# Patient Record
Sex: Female | Born: 1935 | Race: White | Hispanic: No | Marital: Married | Smoking: Never smoker
Health system: Southern US, Community
[De-identification: ages and names within clinical notes are randomized; demographics above are authoritative.]

## PROBLEM LIST (undated history)

## (undated) DIAGNOSIS — E079 Disorder of thyroid, unspecified: Secondary | ICD-10-CM

## (undated) DIAGNOSIS — M1990 Unspecified osteoarthritis, unspecified site: Secondary | ICD-10-CM

## (undated) DIAGNOSIS — E785 Hyperlipidemia, unspecified: Secondary | ICD-10-CM

## (undated) DIAGNOSIS — N189 Chronic kidney disease, unspecified: Secondary | ICD-10-CM

## (undated) DIAGNOSIS — T8571XA Infection and inflammatory reaction due to peritoneal dialysis catheter, initial encounter: Secondary | ICD-10-CM

## (undated) DIAGNOSIS — E119 Type 2 diabetes mellitus without complications: Secondary | ICD-10-CM

## (undated) DIAGNOSIS — M7989 Other specified soft tissue disorders: Secondary | ICD-10-CM

## (undated) DIAGNOSIS — H269 Unspecified cataract: Secondary | ICD-10-CM

## (undated) DIAGNOSIS — I10 Essential (primary) hypertension: Secondary | ICD-10-CM

## (undated) HISTORY — DX: Unspecified cataract: H26.9

## (undated) HISTORY — DX: Essential (primary) hypertension: I10

## (undated) HISTORY — DX: Disorder of thyroid, unspecified: E07.9

## (undated) HISTORY — DX: Unspecified osteoarthritis, unspecified site: M19.90

## (undated) HISTORY — DX: Chronic kidney disease, unspecified: N18.9

## (undated) HISTORY — PX: STOMACH SURGERY: SHX791

## (undated) HISTORY — DX: Other specified soft tissue disorders: M79.89

## (undated) HISTORY — DX: Infection and inflammatory reaction due to peritoneal dialysis catheter, initial encounter: T85.71XA

## (undated) HISTORY — DX: Type 2 diabetes mellitus without complications: E11.9

## (undated) HISTORY — DX: Hyperlipidemia, unspecified: E78.5

---

## 1999-06-23 ENCOUNTER — Ambulatory Visit (HOSPITAL_COMMUNITY): Admission: RE | Admit: 1999-06-23 | Discharge: 1999-06-23 | Payer: Self-pay | Admitting: Neurosurgery

## 1999-08-04 ENCOUNTER — Ambulatory Visit (HOSPITAL_COMMUNITY): Admission: RE | Admit: 1999-08-04 | Discharge: 1999-08-05 | Payer: Self-pay | Admitting: Neurosurgery

## 2000-01-01 ENCOUNTER — Ambulatory Visit (HOSPITAL_COMMUNITY): Admission: RE | Admit: 2000-01-01 | Discharge: 2000-01-01 | Payer: Self-pay | Admitting: Neurosurgery

## 2007-10-15 ENCOUNTER — Inpatient Hospital Stay (HOSPITAL_COMMUNITY): Admission: AD | Admit: 2007-10-15 | Discharge: 2007-10-19 | Payer: Self-pay | Admitting: *Deleted

## 2007-10-15 ENCOUNTER — Ambulatory Visit: Payer: Self-pay | Admitting: *Deleted

## 2007-10-15 ENCOUNTER — Ambulatory Visit: Payer: Self-pay | Admitting: Internal Medicine

## 2007-10-16 ENCOUNTER — Encounter (INDEPENDENT_AMBULATORY_CARE_PROVIDER_SITE_OTHER): Payer: Self-pay | Admitting: Family Medicine

## 2007-10-16 ENCOUNTER — Encounter (INDEPENDENT_AMBULATORY_CARE_PROVIDER_SITE_OTHER): Payer: Self-pay | Admitting: Interventional Radiology

## 2007-10-19 ENCOUNTER — Encounter: Payer: Self-pay | Admitting: Internal Medicine

## 2009-07-06 IMAGING — CR DG CHEST 2V
2 series · 2 of 2 positions shown · non-contrast
Comparison: 10/15/2007.

01/20/08 – REPORT NOW REFLECTS CORRECT ORDERING PHYSICIAN.
CLINICAL DATA: Short of breath, cough, renal failure

 CHEST - 2 VIEW

[w chest pa]
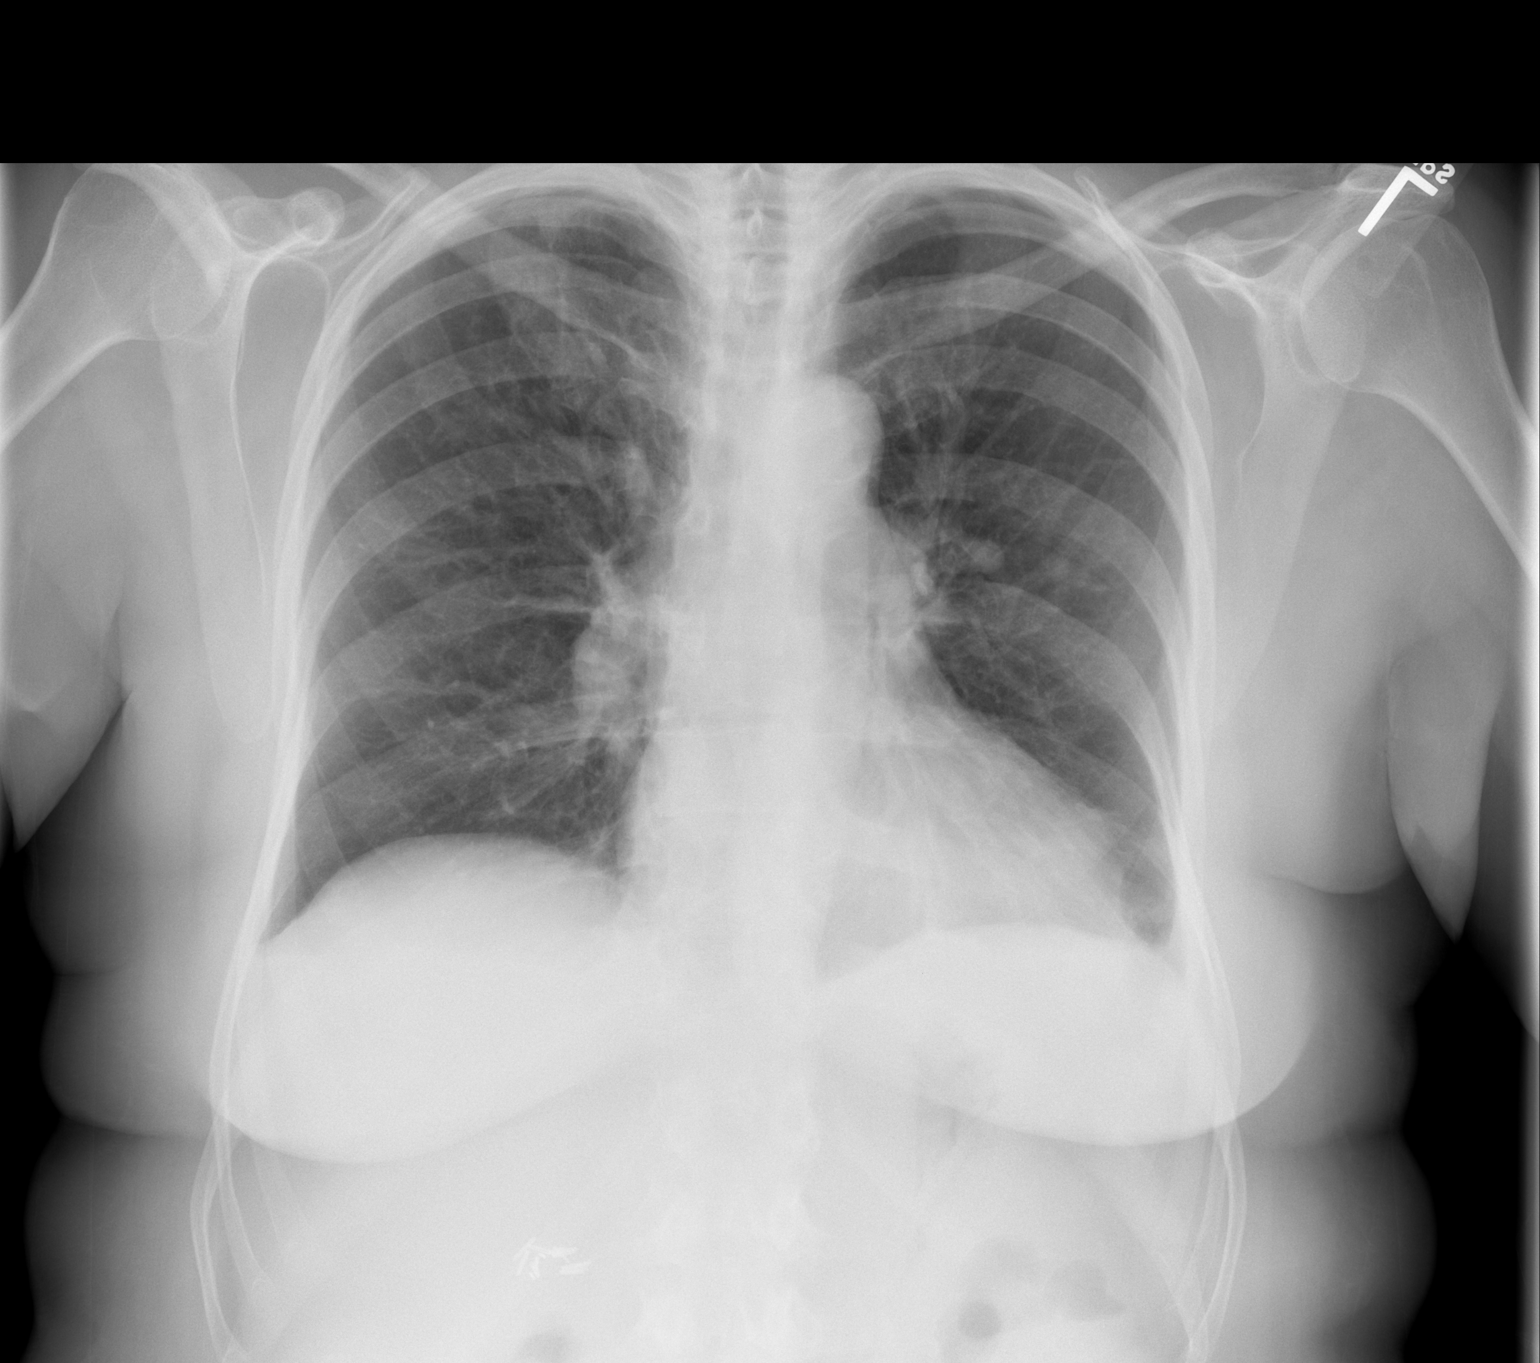

[w chest lat]
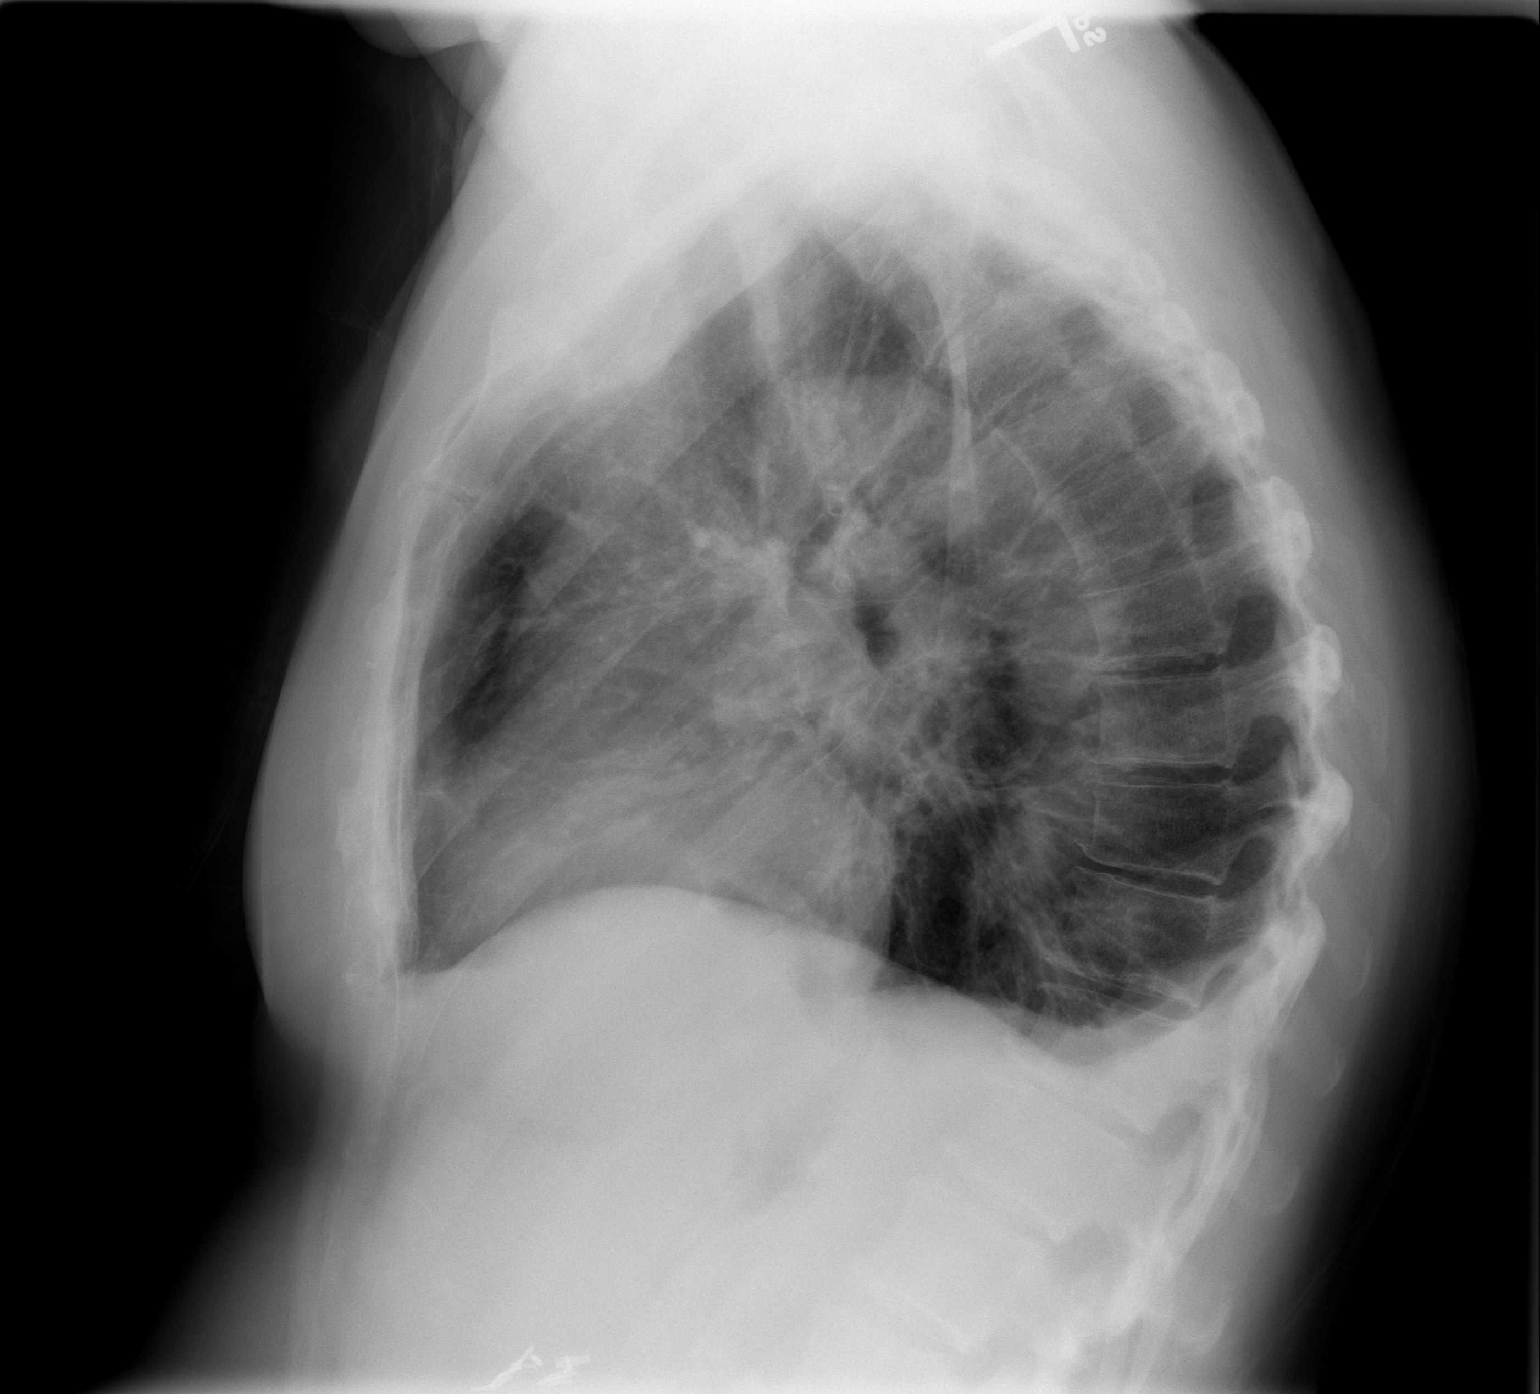

[2 of 2 positions shown; findings below may reference images not displayed]

FINDINGS: There are small pleural effusions present. There is mild
 pulmonary vascular congestion noted. Heart is within upper limits
 of normal. No bony abnormality is seen.
IMPRESSION: Small bilateral pleural effusions and mild pulmonary vascular
 congestion.

## 2010-11-25 NOTE — Discharge Summary (Signed)
Linda Jordan, Linda Jordan NO.:  [id number]   MEDICAL RECORD NO.:  [id number]          PATIENT TYPE:  INP   LOCATION:  6708                         FACILITY:  [facility]   PHYSICIAN:  Lucy Chris, MD     DATE OF BIRTH:  1929-10-02   DATE OF ADMISSION:  10/15/2007  DATE OF DISCHARGE:  10/19/2007                               DISCHARGE SUMMARY   DISCHARGE DIAGNOSES:  1. Nephrotic syndrome.  2. Question of a vascular rash.  3. Neuropathy.  4. Hypertension.  5. History of cholecystitis and cholelithiasis.  6. History of appendicitis.  7. History of tonsillitis.  8. Adenoidectomy.  9. Carpal tunnel release.  10.Degenerative disk disease.  11.Kidney biopsy.  12.Hypoglycemia secondary to steroids.  13.Acute bronchitis.  14.Lower extremity edema.   DISCHARGE MEDICATIONS:  1. Metoprolol XL 50 mg 1 p.o. daily.  2. Synthroid 25 mcg 1 p.o. daily.  3. Prednisone 50 mg 1 tablet p.o. b.i.d.  4. Crestor 5 mg 1 tablet daily.  5. Doxycycline 100 mg 1 tablet twice daily until finished, total      course will be 10 days.  6. Lyrica 75 mg once a day.  7. Alprazolam 0.5 g 1 tablet q.8 h. p.r.n. anxiety.  8. Aspirin 81 mg to be restarted on October 26, 2007.  9. Imodium A-D p.r.n. diarrhea.  10.Furosemide 80 mg 2 pills 3 times a day.   The patient was instructed to stop taking lisinopril, stop taking  previously scheduled dose of Lasix, to stop taking potassium and to stop  taking nabumetone and her dose of Lyrica was decreased.   DISPOSITION:  The patient is to see Dr. Truman Hayward in [place], her primary  care physician in 1-2 weeks.  The patient is to see Dr. Eddie Dibbles at [facility], that office will call the patient to schedule a  followup.  On the day of discharge, the patient had no complaints.  She  was afebrile.  Her blood pressure was 160/68, satting 99% on room air.  Her white blood cell count was elevated to 14.1, so that needs to be  rechecked.  She was given a  course of doxycycline for an upper  respiratory illness.  Her H&H was 11.5 and 33.5, appears to be at the  patient's baseline.  Her creatinine was 2.6, BUN was 65.  This should be  followed up as well at her outpatient visit.  She has a number of labs  pending done in [place] and in our hospital, that will be dictated at a  later point.  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  Her lower extremities, she had marked peripheral edema as well  as a vasculitic-appearing rash.  This is to be followed up on as well as  an outpatient.   On day of discharge, the rash extended from her midshin to ankle, red  and indurated with some nonblanching petechiae, has had a classic  appearance of cellulitic component versus vascular insufficiency  secondary to edema.   PROCEDURE PERFORMED:  1. She had a chest x-ray done on October 16, 2007, which showed small      pleural effusions with mild pulmonary vascular congestions, heart      was within the upper limits normal with no bony abnormalities.      Ultrasound-guided biopsy of her left kidney showed __________ and      was sent to [facility] for further evaluation.  At outside hospital she      had a chest x-ray on October 14, 2007, which showed some interstitial      prominence and no effusions and had a repeat chest x-ray which      showed some left lower lobe atelectasis versus infiltrate beyond      October 14, 2007.  She had a renal ultrasound of the right showed no      hydronephrosis with a mild echogenic cortex, small cyst, measuring      11.5 cm small.  The left kidney had no hydronephrosis, was mildly      echogenic with no focal parenchymal abnormalities measuring 12.2 cm      but with trace ascites.  She had a lower extremity duplex done on      October 13, 2007, which showed no evidence of DVT.  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  She had a V/Q scan done on October 13, 2007,      with low probability of PE.  An echo done in late March 2009 showed      an EF of 60%-65% with left atrial enlargement and mild tricuspid      regurgitation. Peripheral smear done on October 13, 2007, was deemed      to be normal.  UA, this had specific gravity of 1.008, pH of 7 and      was negative for bili, glucose, ketones, had moderate blood, 100 of      protein, 0.2 of urine bili, negative for nitrites and leukocytes.  2. Lipid profile revealed total cholesterol of 227, triglyceride of      131, HDL of 19, and LDL of 182.  3. She had a protein electrophoresis.  Total protein in urine was      noted to be 3272, noted to be high with no alpha-1, no alpha-2, no      beta or gamma noted in the urine.  UPEP at 24 hours noted to be      4200 with 6.34 kappa light chain noted to be high, 2.25 lambda      chains noted to be high.   ADMISSION HISTORY AND PHYSICAL:  Linda Jordan is a 77 year old female who  transferred here from an outside hospital for further evaluation of a  possible nephrotic syndrome.  Briefly, she is a female who developed  acute shortness of breath with increased weakness and feeling fully  fatigue over the past month.  She had been followed by her PCP, Dr. Truman Hayward  and had been given some diuretics for lower extremity edema.  Unfortunately, she has not responded to diuretics initially.  An  echocardiogram was done which showed a normal EF of 60%-65%.  She noted  that on diuretics, her urine output fell and she also developed a rash  in her lower extremities, found to be vasculitic in nature and thus was  admitted to this hospital on October 13, 2007.  At that time, PE was ruled  out and myocardial infarction was ruled out  as well.  EF was noted to be  60%-65%.  Her creatinine worsened over the hospitalization stay.  She  was admitted at the outside hospital with a creatinine of 1.47, but was  transferred with a creatinine of 2.9.  Serum albumin at the time of   admission at an outside hospital was 2.0, calcium of 10.2, negative  cardiac enzymes.  BNP of 1650.  Urinalysis at the outside hospital was  significant for 3+ __________.  She was transferred here for further  workup and better access to resources.  The renal team was  intermittently involved with her hospitalization.   LABORATORY DATA:  Her labs on admission to our institution; CBC, white  blood cell count of 15.9, hemoglobin 12.3, hematocrit 36.3, platelet  count of 222, 86% neutrophils.  PT was noted to be 13.2 and INR of 1.0,  PTT was 38.  Sodium of 136, potassium of 3.1, chloride 95, bicarbonate  of 28, BUN of 45, creatinine of 2.91, glucose of 179.  Her T. bili was  0.3, alk phos was 67, AST was 74, ALT was 52, total protein of 5.3,  albumin was noted to be 1.0, calcium was noted to be 7.8.  Her BMP on  admission here was noted to be 318.  UA on admission here showed  specific gravity of 1.008, pH of 7.0, and only positive for moderate  blood with 7-10 red blood cells per high-power field.  A random urine  creatinine is 47, random urine sodium was 114, random urine protein was  noted to be 90.  Lipid profile, total cholesterol of 227, triglyceride  of 131, HDL of 19, LDL of 182, TSH was 1.233, PTH was 190.5 with calcium  on that test was noted to be 7.7.  Vitamin D level was noted to be 36.   HOSPITAL COURSE:  1. Nephrotic syndrome.  Ms. Divita is a 77 year old female who      developed weakness leading to lower extremity edema that was not      responsive to diuretic as well as a vasculitic-appearing rash on      her lower extremities.  She was initially admitted at outside      hospital where she was found to have large amounts of protein in      her urine and the preliminary diagnosis of nephrotic syndrome was      given.  Given that she was acutely short of breath and has      peripheral edema, the notion of heart failure was raised, however,      while her BNP was elevated,  her EF was noted to be 60%-65%.  PE was      also considered given her acute shortness of breath.  However,      given the duration, it was low on the differential and she had      negative duplex ultrasounds as well as negative V/Q scan.  Her D-      dimer was elevated at the outside hospital, however.  The renal      team was consulted and they felt that the rash as well as syndrome      may be an autoimmune disease versus nephrotic syndrome and it was      decided that given the appearance of her rash that she would      benefit from a renal biopsy.  A number of labs were drawn to      further evaluate for  this patient's cause of proteinuria; some of      these labs were done at the outside hospital and some were done      here.  Also, noted that at the time of discharge, she still had a      cardiolipin, __________ and sed rate noted at the outside hospital      was noted to be 75.  Antiglomerular basement membrane antibody was      noted to be less than 3.0 which was __________.  Her complement      level was noted to be 153 __________ protein electrophoresis showed      total protein, albumin  __________ alpha-1 globulin was noted to be      0.6 which was mildly elevated, alpha-2 globulin was 1.0 __________      gamma globulin 0.4 which was mildly decreased.  There was no      temperature spike observed.  IgG serum was noted to be 368 which      was noted to low; IgA serum was 338, normal limit; IgM 166, normal      limit with an apparent IFE screen was appeared to be in an apparent      immunofixation pattern done at the outside hospital.  Her C-ANCA      was noted to be less than 1-20 which was considered a negative      titer.  Perinuclear ANCA (PA-ANCA) was noted to be less than 1-20,      also noted to be a negative titer.  Atypical ANCA was also noted to      be less than 1-20 which was noted to be a negative titer.  C-      reactive protein was noted to be 475.0 which is  markedly elevated      in outside hospital.  Complement CH50 was noted to be 48, within      normal limits.  Urine protein electrophoresis done on October 14, 2007, showed a total protein of 26.5, markedly elevated, showed a      urine albumin of 42.8, alpha-1 globulin of 18.8%, alpha-2 globulin      of 93%, beta globulin of 12.8%, and gamma globulin of 6.3%; no M      spike noted.  This was repeated at the outside hospital which then      showed an albumin of 48%, alpha-1 globulin 74%, alpha-2 globulin      60.5%, beta globulin 10.8% and gamma globulin was 6.6%.  Of note,      during this hospitalization, her parathyroid hormone was noted to      be elevated at 190.5.  However, her calcium was within normal      limits,  even when corrected with a low albumin, her TSH was      normal.  Protein electrophoresis for Bence Jones proteins were done      here at this hospital and noted 3272 protein; however, there was no      alpha-2, alpha-1, beta, or gamma noted.  Light chains were noted to      be elevated, kappa light chain was 6.34, which was markedly      elevated, lambda chain was 2.25, which was mildly elevated.  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  So, at the time of discharge her creatinine was 2.65, so remained      relatively stable while the idea of an ACE inhibitor was      entertained given that her serum creatinine was acutely changing,      it was decided to hold off on that, and also given her age, it is      probably too late to prevent any further damage to glomerular      apparatus of the kidney.  __________ follow with renal of her      scheduled appointment.  2. Upper respiratory infection.  The patient had a elevated white      count, while this may have been done due to steroids which was      started secondary to the questionable nephrotic syndrome.   She was      noted to have some questionable infiltrate on x-rays as well as      cough, so it was decided to treat her empirically.  She was      initially started on Avelox; however, she remained afebrile.  Her      white count resolved.  So, she was changed to doxycycline for a      course of 10 days.  3. Diarrhea.  The patient developed diarrhea while in the hospital.      C. diff toxin was done 3 times, all 3 times were noted to be      negative.  She was given Imodium for control of the diarrhea, was      likely secondary to steroid use; however, her electrolytes remained      within normal limits and she was instructed to use Imodium A-D      should she need to __________.  4. Steroid use.  She was started on steroid with a questionable      nephrotic syndrome given that this is likely autoimmune with      possibility to respond or infectious in nature that it would likely      respond to steroid treatment.  However, she developed hypoglycemia      on the steroids and insulin treatment was discussed with the      patient and she was told to follow up with her PCP for this and she      did not feel at this time that it was indicated.  5. Hyperlipidemia.  She was given Crestor 5 mg 1 tablet daily.  This      was decreased from her initial dose given mildly elevated LFTs.      This is to be followed as an outpatient and she is going to be      restarted on her Crestor on normal dose pending the normalization      of her LFT.  On the day of discharge, her blood cultures were      negative for anaerobic and aerobic bacteria.  Vitamin D level was      noted to be 36.   LABORATORY DATA:  Sodium 138, potassium 4.1, chloride 103, bicarbonate  24, BUN 65, creatinine 2.65, glucose of 288, calcium of 7.6.  CBC on the  day of discharge white blood cell count 14.1, hemoglobin 11.5,  hematocrit 33.1, and platelet count 251.      Acquanetta Chain, D.O.  Electronically Signed       Lucy Chris, MD  Electronically Signed    ELG/MEDQ  D:  10/22/2007  T:  10/23/2007  Job:  [id number]   cc:   Cher Nakai

## 2010-11-25 NOTE — H&P (Signed)
[place]  Patient:    Linda Jordan                        MRN: [id number] Adm. Date:  [id number] Attending:  Newman Pies D                         History and Physical  CHIEF COMPLAINT:  Left left pain.  HISTORY OF PRESENT ILLNESS:  The patient is a 77 year old white female who was n her usual state of good health until approximately March 23, 1999.  It was n that date that she stepped into a hole and jarred her hip and back.  She had pain and was seen by her primary doctor in [place] and was treated with medications. Hip x-rays were obtained and were "okay."  She continued to have pain and was referred to Dr. Lara Mulch who evaluated her and recommended physical therapy.  The physical therapy made her worse.  She was sent for lumbar MRI, and sent for my consultation.  The patient has pain primarily in the left side of her low back which radiates nto her left hip, down the lateral aspect of her left leg to her knee, and sometimes she has some pain which radiates down to her foot in approximately the S1 distribution.  She has had no pain on the right.  Discomfort is worse with prolonged standing and seems to be worse at night.  It wakes her up at night. he has had chronic low back pain which does not seem to have been exacerbated by the fall.  She has a questionable neuropathy which was diagnosed by a neurologist in [place] approximately 10 years ago secondary to numbness in her feet.  PAST MEDICAL HISTORY:  Positive for hypertension, questionable neuropathy, remote history of cholecystitis/cholelithiasis, appendicitis, tonsillitis.  PRESENT MEDICATIONS: 1. Premarin 0.625 mg p.o. q.d. 2. Atenolol with hydrochlorothiazide 50/25 p.o. q.d. 3. Prinivil 20 mg p.o. q.d. 4. Potassium chloride 3 mEq t.i.d. 5. Naprosyn 500 mg p.o. q.d. 6. Hydrocodone p.r.n.  ALLERGIES:  No known drug allergies.  PAST SURGICAL HISTORY:   Partial tonsillectomy, dilatation and curettage, carpal  tunnel release, tonsillectomy, adenoidectomy, cholecystectomy, appendectomy.  FAMILY MEDICAL HISTORY:  The patients mother died at age 59 secondary to heart problems.  The patients father died at age 51 secondary to heart disease and "abdominal process."  The patient could not be more specific.  SOCIAL HISTORY:  The patient is married.  She has one daughter.  She lives in [place].  She retired from an office job.  She denies tobacco, ethanol, and drug  use.  REVIEW OF SYSTEMS:  Negative except as above.  PHYSICAL EXAMINATION:  GENERAL:  A pleasant, well-nourished, well-developed, 76 year old white female n no apparent distress.  VITAL SIGNS:  Height 5 feet 7 inches, weight 176 pounds.  HEENT:  Normocephalic, atraumatic.  Pupils are equal, round and reactive to light. Extraocular muscles intact. Sclerae white.  Conjunctivae pink.  Oropharynx benign. Uvula midline.  NECK:  Supple.  There are no masses, meningismus, deformity, tracheal deviation, jugular venous distension, carotid bruits.  She has a normal cervical range of motion.  Spurlings test is negative.  Lhermittes sign was not present.  Thorax s symmetric.  LUNGS:  Clear to auscultation.  HEART:  Regular rate and rhythm.  ABDOMEN:  Soft, nontender with no guarding or rebound.  Bowel sounds are present.  BACK: No  tenderness to palpation, no deformities. Straight leg raise testing is  equivocal on the left, negative on the right. Faberes test negative bilaterally.  NEUROLOGIC:  The patient is alert and oriented x 3.  Cranial nerves II-XII grossly intact bilaterally.  Vision and hearing are grossly normal bilaterally.  Motor strength is 5/5 bilateral deltoid, biceps, triceps, hand grip, wrist extensor, interosseus psoas, quadriceps, gastrocnemius, extensor hallucis longus. Cerebellar exam intact to rapid alternating movements of the upper  extremities bilaterally. Deep tendon reflexes are 2/4 in bilateral biceps, triceps, brachial radialis, quadriceps, and gastrocnemius.  There is bilateral flexor plantar reflexes.  No  ankle clonus. Sensory exam is grossly normal to light touch and pinprick sensation of all tested dermatomes bilaterally.  DIAGNOSTIC STUDIES:  The patient had AP and lateral lumbar x-ray performed at [facility] May 07, 1999, which is essentially unremarkable  except for degenerative disk disease at L5-S1.  The patient had a lumbar MRI performed at [facility] May 07, 1999, which showed normal lumbar lordosis.  She has a mild degenerative disease at L4-5 with mild desiccation of the disk and mild central bulging.  The same is seen at L5-S1.  On the axial images, L2-3 is normal.  L3-4 has mild to multifactorial spinal stenosis.  L4-5 has central bulging of the disk.  L5-S1 has degenerative  disease, no neural compression.  I subsequently performed a lumbar CT myelogram when she failed to improve with medical management at [facility] on June 23, 1999.  She had an extradural defect at L4-5 on the right and a small extradural defect at L3-4 on the left.  On the CT scan at L3-4, she had a herniated nucleus pulposus at L3-4 on he left, but foraminal and extraspinal does appear to compress the thecal sac and somewhat compress the left L3 nerve root. L4-5 has similar findings on the right. L5-S1 has degenerative disease with vacuum disk phenomenon but no neural compression.  ASSESSMENT/PLAN: 1. L3-4 degenerative disk disease. 2. Herniated nucleus pulposus. 3. Lumbago. 4. Lumbar radiculopathy.  I have discussed the surgery with the patient and reviewed her MRI and CT myelogram with her, pointing out the abnormalities.  The best I can tell, she is symptomatic for disk herniation of L3-4 on the left, causing left 3 or 4  radiculopathy. The patient does have a disk herniation and spinal stenosis at L4-5, but it does not bother her nearly as much as the left.  I discussed the various treatment options  with her including doing nothing, continued medical management, steroid injections, and surgery.  I have described the procedure of left L3-4 laminotomy, foraminotomy, and extraforaminal, i.e., far lateral diskectomy.  I showed her surgical models and discussed the risks of surgery.  The patient has weighed the risks, benefits, and alternatives to surgery and would like to proceed with the operation on August 04, 1999.  History of hypertension and possible neuropathy noted. DD:  08/04/99 TD:  08/04/99 Job: [id number]

## 2010-11-25 NOTE — Op Note (Signed)
[place]. [facility]  Patient:    Linda Jordan                        MRN: [id number] Proc. Date: 08/04/99 Adm. Date:  [id number] Disc. Date: [id number] Attending:  Newman Pies D                           Operative Report  BRIEF HISTORY:  Patient is a 77 year old white female who has suffered from back and left leg pain for many months.  She has failed medical management and was worked as an outpatient with a lumbar MRI as well as a lumbar myelo-CT.  Her symptoms were consistent with a lumbar radiculopathy.  She had a herniated disc at L3-4 on the left.  She therefore weighed the risks, benefits and alternatives to surgery and decided to proceed with a microdiskectomy.  PREOPERATIVE DIAGNOSES:  Far-lateral herniated nucleus pulposus, L3-4 on the left; left L3 radiculopathy.  POSTOPERATIVE DIAGNOSES:  Far-lateral herniated nucleus pulposus, L3-4 on the left; left L3 radiculopathy.  PROCEDURE:  Left far-lateral microdiskectomy using microdissection.  SURGEON:  Ophelia Charter, M.D.  ASSISTANT:  Madelon Lips. Quentin Cornwall, M.D.  ANESTHESIA:  General endotracheal.  ESTIMATED BLOOD LOSS:  100 cc.  SPECIMENS:  None.  DRAINS:  None.  COMPLICATIONS:  None.  DESCRIPTION OF PROCEDURE:  Patient was brought to the operating room by the anesthesia team.  General endotracheal anesthesia was induced.  Patient was then turned to the prone position on the Wilson frame.  Her lumbosacral region was then prepared with Betadine scrub and Betadine solution.  Sterile drapes were applied and I then injected the area to be incised with Marcaine with epinephrine solution and I used the scalpel to make a vertical incision in the midline over the L3-4  interspace.  I used electrocautery to dissect down to the thoracolumbar fascia nd I divided just to the left of midline and performed a left-sided subperiosteal dissection, stripping the paraspinal musculature  from the spinous process and laminae of L3 and L4.  I inserted the McCullough retractor and obtained intraoperative radiograph.  I then brought the operative microscope into the field and under its magnification and illumination, I completed the decompression/microdissection.  I used the Midas Rex high-speed drill to perform a left L3 laminotomy.  I drilled off the caudal edge of the left L3 lamina until I encountered the insertion of he ligamentum flavum.  I then widened the laminotomy and removed the left L3-4 ligamentum flavum.  I decompressed the thecal sac and identified the left L4 nerve root and then performed an intraspinal decompression.  I used the angled curette to remove some of the excess ligamentum flavum from the lateral recess.  I got a good decompression of her thecal sac and the left L4 nerve root.  I then turned my attention to the extra-foraminal, i.e., far-lateral diskectomy.  I used electrocautery to detach the fascia from the lateral edge of the left L3-4 facet. I then used a Midas Rex high-speed drill to drill off the lateral aspect of the  left L3-4 facet.  I then divided the intertransverse ligament with the 15 blade  scalpel and removed the ligament with the Kerrison punch and then removed more f the lateral aspect of the left L3-4 facet.  I then used microdissection to dissect through the soft tissue and identify the left extra-foraminal L3 nerve root.  I  then carefully freed it up from the epidural tissue and retracted it gently laterally and beneath it was a moderate-size subligamentous disc rupture which as compressing the ventral aspect of the L3 nerve root.  I then incised into this subligamentous disc herniation and performed a partial diskectomy using the pituitary forceps and the Epstein curettes.  I then palpated about the ventral surface of the left L3 nerve root and it was well-decompressed.  I then palpated once again about the anterior  aspect of the thecal sac, i.e., via the L3 laminotomy, and traced the L4 nerve root throughout the neural foramen and it was well-decompressed.  I then copiously irrigated the wound with Bacitracin solution, removed the solution and achieved stringent hemostasis with bipolar electrocautery. I then removed the McCullough retractor and reapproximated the patients thoracolumbar fascia with interrupted #1 Vicryl, subcutaneous tissue with interrupted 2-0 Vicryl and skin with Steri-Strips and Benzoin and the wound was  then coated with Bacitracin ointment.  A sterile dressing was applied, the drapes were removed and patient was subsequently returned to a supine position where she was extubated by the anesthesia team and transported to the postanesthesia care  unit in stable condition.  All sponge, instrument and needle counts were correct at the end of this case.DD:  08/04/99 TD:  08/06/99 Job: [id number]

## 2011-02-16 DIAGNOSIS — M1990 Unspecified osteoarthritis, unspecified site: Secondary | ICD-10-CM

## 2011-02-16 DIAGNOSIS — M7989 Other specified soft tissue disorders: Secondary | ICD-10-CM

## 2011-02-16 DIAGNOSIS — B351 Tinea unguium: Secondary | ICD-10-CM | POA: Insufficient documentation

## 2011-04-04 LAB — PROTEIN ELECTROPHORESIS, SERUM
Beta 2: 11.8 — ABNORMAL HIGH
Beta Globulin: 7.8 — ABNORMAL HIGH
Gamma Globulin: 9.2 — ABNORMAL LOW

## 2011-04-04 LAB — RENAL FUNCTION PANEL
Albumin: 1.1 — ABNORMAL LOW
Albumin: 1.1 — ABNORMAL LOW
CO2: 30
Calcium: 8.2 — ABNORMAL LOW
Chloride: 98
Chloride: 99
GFR calc Af Amer: 19 — ABNORMAL LOW
GFR calc Af Amer: 19 — ABNORMAL LOW
GFR calc Af Amer: 19 — ABNORMAL LOW
GFR calc non Af Amer: 15 — ABNORMAL LOW
GFR calc non Af Amer: 16 — ABNORMAL LOW
Glucose, Bld: 123 — ABNORMAL HIGH
Phosphorus: 3.9
Potassium: 4.2
Sodium: 136
Sodium: 137
Sodium: 140

## 2011-04-04 LAB — COMPREHENSIVE METABOLIC PANEL
ALT: 32
AST: 34
Albumin: 1 — ABNORMAL LOW
CO2: 28
Calcium: 7.8 — ABNORMAL LOW
GFR calc Af Amer: 19 — ABNORMAL LOW
GFR calc non Af Amer: 16 — ABNORMAL LOW
Sodium: 136
Total Protein: 5.3 — ABNORMAL LOW

## 2011-04-04 LAB — CBC
Hemoglobin: 11.1 — ABNORMAL LOW
Hemoglobin: 11.5 — ABNORMAL LOW
Hemoglobin: 11.9 — ABNORMAL LOW
Hemoglobin: 12.8
MCHC: 33.9
MCHC: 35.1
Platelets: 251
RBC: 3.52 — ABNORMAL LOW
RBC: 3.95
RBC: 3.98
RBC: 4.14
RDW: 13
RDW: 13.1
RDW: 13.3
RDW: 13.6
WBC: 12.1 — ABNORMAL HIGH
WBC: 13.1 — ABNORMAL HIGH
WBC: 15.9 — ABNORMAL HIGH

## 2011-04-04 LAB — UIFE/LIGHT CHAINS/TP QN, 24-HR UR
Albumin, U: DETECTED
Alpha 1, Urine: DETECTED — AB
Beta, Urine: DETECTED — AB
Free Lambda Excretion/Day: 94.5
Gamma Globulin, Urine: DETECTED — AB
Total Protein, Urine: 77.9

## 2011-04-04 LAB — URINALYSIS, ROUTINE W REFLEX MICROSCOPIC
Bilirubin Urine: NEGATIVE
Ketones, ur: NEGATIVE
Nitrite: NEGATIVE
Protein, ur: 100 — AB
Specific Gravity, Urine: 1.008
Urobilinogen, UA: 0.2

## 2011-04-04 LAB — BASIC METABOLIC PANEL
BUN: 65 — ABNORMAL HIGH
CO2: 27
Calcium: 7.6 — ABNORMAL LOW
Calcium: 8 — ABNORMAL LOW
Creatinine, Ser: 2.65 — ABNORMAL HIGH
Creatinine, Ser: 2.91 — ABNORMAL HIGH
GFR calc Af Amer: 19 — ABNORMAL LOW
GFR calc non Af Amer: 16 — ABNORMAL LOW
GFR calc non Af Amer: 18 — ABNORMAL LOW
Glucose, Bld: 288 — ABNORMAL HIGH
Sodium: 138

## 2011-04-04 LAB — VITAMIN D 1,25 DIHYDROXY: Vit D, 1,25-Dihydroxy: 36 pg/mL (ref 15–75)

## 2011-04-04 LAB — CULTURE, BLOOD (ROUTINE X 2): Culture: NO GROWTH

## 2011-04-04 LAB — CREATININE CLEARANCE, URINE, 24 HOUR
Collection Interval-CRCL: 24
Creatinine Clearance: 27 — ABNORMAL LOW
Creatinine, 24H Ur: 1126
Urine Total Volume-CRCL: 4200

## 2011-04-04 LAB — APTT: aPTT: 38 — ABNORMAL HIGH

## 2011-04-04 LAB — URINE CULTURE
Colony Count: NO GROWTH
Culture: NO GROWTH

## 2011-04-04 LAB — DIFFERENTIAL
Basophils Absolute: 0
Eosinophils Absolute: 0.2
Eosinophils Relative: 1
Lymphocytes Relative: 10 — ABNORMAL LOW
Lymphs Abs: 1.4
Monocytes Absolute: 0.6
Monocytes Absolute: 0.7
Monocytes Relative: 4
Monocytes Relative: 4
Neutro Abs: 11.2 — ABNORMAL HIGH

## 2011-04-04 LAB — PROTIME-INR: Prothrombin Time: 13.1

## 2011-04-04 LAB — LIPID PANEL
Cholesterol: 227 — ABNORMAL HIGH
HDL: 19 — ABNORMAL LOW
Total CHOL/HDL Ratio: 11.9
VLDL: 26

## 2011-04-04 LAB — CLOSTRIDIUM DIFFICILE EIA
C difficile Toxins A+B, EIA: NEGATIVE
C difficile Toxins A+B, EIA: NEGATIVE
C difficile Toxins A+B, EIA: NEGATIVE

## 2011-04-04 LAB — URINE MICROSCOPIC-ADD ON

## 2011-04-04 LAB — B-NATRIURETIC PEPTIDE (CONVERTED LAB): Pro B Natriuretic peptide (BNP): 318 — ABNORMAL HIGH

## 2011-04-04 LAB — BLEEDING TIME: Bleeding Time: 5.5

## 2011-04-04 LAB — PROTEIN, URINE, RANDOM: Total Protein, Urine: 90

## 2011-04-04 LAB — TSH: TSH: 1.233

## 2011-07-25 DIAGNOSIS — Z7901 Long term (current) use of anticoagulants: Secondary | ICD-10-CM | POA: Diagnosis not present

## 2011-07-25 DIAGNOSIS — K219 Gastro-esophageal reflux disease without esophagitis: Secondary | ICD-10-CM | POA: Diagnosis not present

## 2011-07-25 DIAGNOSIS — E119 Type 2 diabetes mellitus without complications: Secondary | ICD-10-CM | POA: Diagnosis not present

## 2011-07-25 DIAGNOSIS — E039 Hypothyroidism, unspecified: Secondary | ICD-10-CM | POA: Diagnosis not present

## 2011-07-25 DIAGNOSIS — Z6829 Body mass index (BMI) 29.0-29.9, adult: Secondary | ICD-10-CM | POA: Diagnosis not present

## 2011-07-25 DIAGNOSIS — E785 Hyperlipidemia, unspecified: Secondary | ICD-10-CM | POA: Diagnosis not present

## 2011-08-09 DIAGNOSIS — B351 Tinea unguium: Secondary | ICD-10-CM | POA: Diagnosis not present

## 2011-08-09 DIAGNOSIS — M79609 Pain in unspecified limb: Secondary | ICD-10-CM | POA: Diagnosis not present

## 2011-08-15 DIAGNOSIS — K219 Gastro-esophageal reflux disease without esophagitis: Secondary | ICD-10-CM | POA: Diagnosis not present

## 2011-08-15 DIAGNOSIS — I10 Essential (primary) hypertension: Secondary | ICD-10-CM | POA: Diagnosis not present

## 2011-08-15 DIAGNOSIS — E119 Type 2 diabetes mellitus without complications: Secondary | ICD-10-CM | POA: Diagnosis not present

## 2011-08-15 DIAGNOSIS — E039 Hypothyroidism, unspecified: Secondary | ICD-10-CM | POA: Diagnosis not present

## 2011-08-22 DIAGNOSIS — E039 Hypothyroidism, unspecified: Secondary | ICD-10-CM | POA: Diagnosis not present

## 2011-08-22 DIAGNOSIS — K219 Gastro-esophageal reflux disease without esophagitis: Secondary | ICD-10-CM | POA: Diagnosis not present

## 2011-08-22 DIAGNOSIS — E119 Type 2 diabetes mellitus without complications: Secondary | ICD-10-CM | POA: Diagnosis not present

## 2011-08-22 DIAGNOSIS — I10 Essential (primary) hypertension: Secondary | ICD-10-CM | POA: Diagnosis not present

## 2011-08-28 DIAGNOSIS — Z7901 Long term (current) use of anticoagulants: Secondary | ICD-10-CM | POA: Diagnosis not present

## 2011-09-25 DIAGNOSIS — K219 Gastro-esophageal reflux disease without esophagitis: Secondary | ICD-10-CM | POA: Diagnosis not present

## 2011-09-25 DIAGNOSIS — Z6828 Body mass index (BMI) 28.0-28.9, adult: Secondary | ICD-10-CM | POA: Diagnosis not present

## 2011-09-25 DIAGNOSIS — Z7901 Long term (current) use of anticoagulants: Secondary | ICD-10-CM | POA: Diagnosis not present

## 2011-09-25 DIAGNOSIS — E119 Type 2 diabetes mellitus without complications: Secondary | ICD-10-CM | POA: Diagnosis not present

## 2011-09-25 DIAGNOSIS — E785 Hyperlipidemia, unspecified: Secondary | ICD-10-CM | POA: Diagnosis not present

## 2011-09-25 DIAGNOSIS — I10 Essential (primary) hypertension: Secondary | ICD-10-CM | POA: Diagnosis not present

## 2011-09-25 DIAGNOSIS — E039 Hypothyroidism, unspecified: Secondary | ICD-10-CM | POA: Diagnosis not present

## 2011-10-10 DIAGNOSIS — K219 Gastro-esophageal reflux disease without esophagitis: Secondary | ICD-10-CM | POA: Diagnosis not present

## 2011-10-10 DIAGNOSIS — E119 Type 2 diabetes mellitus without complications: Secondary | ICD-10-CM | POA: Diagnosis not present

## 2011-10-10 DIAGNOSIS — I10 Essential (primary) hypertension: Secondary | ICD-10-CM | POA: Diagnosis not present

## 2011-10-10 DIAGNOSIS — E039 Hypothyroidism, unspecified: Secondary | ICD-10-CM | POA: Diagnosis not present

## 2011-10-17 DIAGNOSIS — I10 Essential (primary) hypertension: Secondary | ICD-10-CM | POA: Diagnosis not present

## 2011-10-17 DIAGNOSIS — E785 Hyperlipidemia, unspecified: Secondary | ICD-10-CM | POA: Diagnosis not present

## 2011-10-17 DIAGNOSIS — K219 Gastro-esophageal reflux disease without esophagitis: Secondary | ICD-10-CM | POA: Diagnosis not present

## 2011-10-17 DIAGNOSIS — E039 Hypothyroidism, unspecified: Secondary | ICD-10-CM | POA: Diagnosis not present

## 2011-10-26 DIAGNOSIS — E039 Hypothyroidism, unspecified: Secondary | ICD-10-CM | POA: Diagnosis not present

## 2011-10-26 DIAGNOSIS — Z6828 Body mass index (BMI) 28.0-28.9, adult: Secondary | ICD-10-CM | POA: Diagnosis not present

## 2011-10-26 DIAGNOSIS — B0229 Other postherpetic nervous system involvement: Secondary | ICD-10-CM | POA: Diagnosis not present

## 2011-10-26 DIAGNOSIS — Z7901 Long term (current) use of anticoagulants: Secondary | ICD-10-CM | POA: Diagnosis not present

## 2011-10-26 DIAGNOSIS — E119 Type 2 diabetes mellitus without complications: Secondary | ICD-10-CM | POA: Diagnosis not present

## 2011-10-26 DIAGNOSIS — K219 Gastro-esophageal reflux disease without esophagitis: Secondary | ICD-10-CM | POA: Diagnosis not present

## 2011-11-06 DIAGNOSIS — L0201 Cutaneous abscess of face: Secondary | ICD-10-CM | POA: Diagnosis not present

## 2011-11-06 DIAGNOSIS — L03211 Cellulitis of face: Secondary | ICD-10-CM | POA: Diagnosis not present

## 2011-11-06 DIAGNOSIS — K219 Gastro-esophageal reflux disease without esophagitis: Secondary | ICD-10-CM | POA: Diagnosis not present

## 2011-11-06 DIAGNOSIS — Z6828 Body mass index (BMI) 28.0-28.9, adult: Secondary | ICD-10-CM | POA: Diagnosis not present

## 2011-11-06 DIAGNOSIS — F09 Unspecified mental disorder due to known physiological condition: Secondary | ICD-10-CM | POA: Diagnosis not present

## 2011-11-08 DIAGNOSIS — L03211 Cellulitis of face: Secondary | ICD-10-CM | POA: Diagnosis not present

## 2011-11-08 DIAGNOSIS — L0201 Cutaneous abscess of face: Secondary | ICD-10-CM | POA: Diagnosis not present

## 2011-11-08 DIAGNOSIS — B029 Zoster without complications: Secondary | ICD-10-CM | POA: Diagnosis not present

## 2011-11-08 DIAGNOSIS — K219 Gastro-esophageal reflux disease without esophagitis: Secondary | ICD-10-CM | POA: Diagnosis not present

## 2011-11-08 DIAGNOSIS — E039 Hypothyroidism, unspecified: Secondary | ICD-10-CM | POA: Diagnosis not present

## 2011-11-09 DIAGNOSIS — N042 Nephrotic syndrome with diffuse membranous glomerulonephritis: Secondary | ICD-10-CM | POA: Diagnosis not present

## 2011-11-09 DIAGNOSIS — I129 Hypertensive chronic kidney disease with stage 1 through stage 4 chronic kidney disease, or unspecified chronic kidney disease: Secondary | ICD-10-CM | POA: Diagnosis not present

## 2011-11-09 DIAGNOSIS — N189 Chronic kidney disease, unspecified: Secondary | ICD-10-CM | POA: Diagnosis not present

## 2011-11-09 DIAGNOSIS — B029 Zoster without complications: Secondary | ICD-10-CM | POA: Diagnosis not present

## 2011-11-15 DIAGNOSIS — J309 Allergic rhinitis, unspecified: Secondary | ICD-10-CM | POA: Diagnosis not present

## 2011-11-15 DIAGNOSIS — L0201 Cutaneous abscess of face: Secondary | ICD-10-CM | POA: Diagnosis not present

## 2011-11-15 DIAGNOSIS — I10 Essential (primary) hypertension: Secondary | ICD-10-CM | POA: Diagnosis not present

## 2011-11-15 DIAGNOSIS — L03211 Cellulitis of face: Secondary | ICD-10-CM | POA: Diagnosis not present

## 2011-11-15 DIAGNOSIS — B029 Zoster without complications: Secondary | ICD-10-CM | POA: Diagnosis not present

## 2011-11-15 DIAGNOSIS — M79609 Pain in unspecified limb: Secondary | ICD-10-CM | POA: Diagnosis not present

## 2011-11-15 DIAGNOSIS — B351 Tinea unguium: Secondary | ICD-10-CM | POA: Diagnosis not present

## 2011-11-27 DIAGNOSIS — Z7901 Long term (current) use of anticoagulants: Secondary | ICD-10-CM | POA: Diagnosis not present

## 2011-12-27 DIAGNOSIS — K219 Gastro-esophageal reflux disease without esophagitis: Secondary | ICD-10-CM | POA: Diagnosis not present

## 2011-12-27 DIAGNOSIS — E039 Hypothyroidism, unspecified: Secondary | ICD-10-CM | POA: Diagnosis not present

## 2011-12-27 DIAGNOSIS — E119 Type 2 diabetes mellitus without complications: Secondary | ICD-10-CM | POA: Diagnosis not present

## 2011-12-27 DIAGNOSIS — Z7901 Long term (current) use of anticoagulants: Secondary | ICD-10-CM | POA: Diagnosis not present

## 2011-12-27 DIAGNOSIS — I10 Essential (primary) hypertension: Secondary | ICD-10-CM | POA: Diagnosis not present

## 2012-01-26 DIAGNOSIS — E119 Type 2 diabetes mellitus without complications: Secondary | ICD-10-CM | POA: Diagnosis not present

## 2012-01-26 DIAGNOSIS — I10 Essential (primary) hypertension: Secondary | ICD-10-CM | POA: Diagnosis not present

## 2012-01-26 DIAGNOSIS — I803 Phlebitis and thrombophlebitis of lower extremities, unspecified: Secondary | ICD-10-CM | POA: Diagnosis not present

## 2012-01-26 DIAGNOSIS — E039 Hypothyroidism, unspecified: Secondary | ICD-10-CM | POA: Diagnosis not present

## 2012-01-26 DIAGNOSIS — Z7901 Long term (current) use of anticoagulants: Secondary | ICD-10-CM | POA: Diagnosis not present

## 2012-02-21 DIAGNOSIS — M79609 Pain in unspecified limb: Secondary | ICD-10-CM | POA: Diagnosis not present

## 2012-02-21 DIAGNOSIS — B351 Tinea unguium: Secondary | ICD-10-CM | POA: Diagnosis not present

## 2012-02-27 DIAGNOSIS — E119 Type 2 diabetes mellitus without complications: Secondary | ICD-10-CM | POA: Diagnosis not present

## 2012-02-27 DIAGNOSIS — Z7901 Long term (current) use of anticoagulants: Secondary | ICD-10-CM | POA: Diagnosis not present

## 2012-02-27 DIAGNOSIS — I803 Phlebitis and thrombophlebitis of lower extremities, unspecified: Secondary | ICD-10-CM | POA: Diagnosis not present

## 2012-02-27 DIAGNOSIS — Z6827 Body mass index (BMI) 27.0-27.9, adult: Secondary | ICD-10-CM | POA: Diagnosis not present

## 2012-02-27 DIAGNOSIS — I10 Essential (primary) hypertension: Secondary | ICD-10-CM | POA: Diagnosis not present

## 2012-03-18 DIAGNOSIS — Z86010 Personal history of colonic polyps: Secondary | ICD-10-CM | POA: Diagnosis not present

## 2012-03-18 DIAGNOSIS — Z79899 Other long term (current) drug therapy: Secondary | ICD-10-CM | POA: Diagnosis not present

## 2012-03-28 DIAGNOSIS — Z7901 Long term (current) use of anticoagulants: Secondary | ICD-10-CM | POA: Diagnosis not present

## 2012-03-28 DIAGNOSIS — E785 Hyperlipidemia, unspecified: Secondary | ICD-10-CM | POA: Diagnosis not present

## 2012-03-28 DIAGNOSIS — I803 Phlebitis and thrombophlebitis of lower extremities, unspecified: Secondary | ICD-10-CM | POA: Diagnosis not present

## 2012-03-28 DIAGNOSIS — Z23 Encounter for immunization: Secondary | ICD-10-CM | POA: Diagnosis not present

## 2012-03-28 DIAGNOSIS — E039 Hypothyroidism, unspecified: Secondary | ICD-10-CM | POA: Diagnosis not present

## 2012-03-28 DIAGNOSIS — Z6827 Body mass index (BMI) 27.0-27.9, adult: Secondary | ICD-10-CM | POA: Diagnosis not present

## 2012-03-28 DIAGNOSIS — I10 Essential (primary) hypertension: Secondary | ICD-10-CM | POA: Diagnosis not present

## 2012-04-11 DIAGNOSIS — Z86010 Personal history of colonic polyps: Secondary | ICD-10-CM | POA: Diagnosis not present

## 2012-04-11 DIAGNOSIS — D126 Benign neoplasm of colon, unspecified: Secondary | ICD-10-CM | POA: Diagnosis not present

## 2012-04-11 DIAGNOSIS — Z1211 Encounter for screening for malignant neoplasm of colon: Secondary | ICD-10-CM | POA: Diagnosis not present

## 2012-04-18 DIAGNOSIS — N189 Chronic kidney disease, unspecified: Secondary | ICD-10-CM | POA: Diagnosis not present

## 2012-04-18 DIAGNOSIS — N052 Unspecified nephritic syndrome with diffuse membranous glomerulonephritis: Secondary | ICD-10-CM | POA: Diagnosis not present

## 2012-04-18 DIAGNOSIS — I129 Hypertensive chronic kidney disease with stage 1 through stage 4 chronic kidney disease, or unspecified chronic kidney disease: Secondary | ICD-10-CM | POA: Diagnosis not present

## 2012-04-18 DIAGNOSIS — N042 Nephrotic syndrome with diffuse membranous glomerulonephritis: Secondary | ICD-10-CM | POA: Diagnosis not present

## 2012-04-26 DIAGNOSIS — E119 Type 2 diabetes mellitus without complications: Secondary | ICD-10-CM | POA: Diagnosis not present

## 2012-04-26 DIAGNOSIS — J449 Chronic obstructive pulmonary disease, unspecified: Secondary | ICD-10-CM | POA: Diagnosis not present

## 2012-04-26 DIAGNOSIS — E039 Hypothyroidism, unspecified: Secondary | ICD-10-CM | POA: Diagnosis not present

## 2012-04-26 DIAGNOSIS — Z6827 Body mass index (BMI) 27.0-27.9, adult: Secondary | ICD-10-CM | POA: Diagnosis not present

## 2012-04-26 DIAGNOSIS — Z7901 Long term (current) use of anticoagulants: Secondary | ICD-10-CM | POA: Diagnosis not present

## 2012-04-26 DIAGNOSIS — I803 Phlebitis and thrombophlebitis of lower extremities, unspecified: Secondary | ICD-10-CM | POA: Diagnosis not present

## 2012-05-27 DIAGNOSIS — I803 Phlebitis and thrombophlebitis of lower extremities, unspecified: Secondary | ICD-10-CM | POA: Diagnosis not present

## 2012-05-27 DIAGNOSIS — Z7901 Long term (current) use of anticoagulants: Secondary | ICD-10-CM | POA: Diagnosis not present

## 2012-05-27 DIAGNOSIS — E119 Type 2 diabetes mellitus without complications: Secondary | ICD-10-CM | POA: Diagnosis not present

## 2012-05-27 DIAGNOSIS — E039 Hypothyroidism, unspecified: Secondary | ICD-10-CM | POA: Diagnosis not present

## 2012-05-27 DIAGNOSIS — K219 Gastro-esophageal reflux disease without esophagitis: Secondary | ICD-10-CM | POA: Diagnosis not present

## 2012-05-29 DIAGNOSIS — M79609 Pain in unspecified limb: Secondary | ICD-10-CM | POA: Diagnosis not present

## 2012-05-29 DIAGNOSIS — B351 Tinea unguium: Secondary | ICD-10-CM | POA: Diagnosis not present

## 2012-06-20 DIAGNOSIS — Z961 Presence of intraocular lens: Secondary | ICD-10-CM | POA: Diagnosis not present

## 2012-06-27 DIAGNOSIS — K219 Gastro-esophageal reflux disease without esophagitis: Secondary | ICD-10-CM | POA: Diagnosis not present

## 2012-06-27 DIAGNOSIS — I803 Phlebitis and thrombophlebitis of lower extremities, unspecified: Secondary | ICD-10-CM | POA: Diagnosis not present

## 2012-06-27 DIAGNOSIS — Z6828 Body mass index (BMI) 28.0-28.9, adult: Secondary | ICD-10-CM | POA: Diagnosis not present

## 2012-06-27 DIAGNOSIS — E039 Hypothyroidism, unspecified: Secondary | ICD-10-CM | POA: Diagnosis not present

## 2012-06-27 DIAGNOSIS — Z7901 Long term (current) use of anticoagulants: Secondary | ICD-10-CM | POA: Diagnosis not present

## 2012-07-29 DIAGNOSIS — E039 Hypothyroidism, unspecified: Secondary | ICD-10-CM | POA: Diagnosis not present

## 2012-07-29 DIAGNOSIS — Z1331 Encounter for screening for depression: Secondary | ICD-10-CM | POA: Diagnosis not present

## 2012-07-29 DIAGNOSIS — Z9181 History of falling: Secondary | ICD-10-CM | POA: Diagnosis not present

## 2012-07-29 DIAGNOSIS — Z6828 Body mass index (BMI) 28.0-28.9, adult: Secondary | ICD-10-CM | POA: Diagnosis not present

## 2012-07-29 DIAGNOSIS — K219 Gastro-esophageal reflux disease without esophagitis: Secondary | ICD-10-CM | POA: Diagnosis not present

## 2012-07-29 DIAGNOSIS — I803 Phlebitis and thrombophlebitis of lower extremities, unspecified: Secondary | ICD-10-CM | POA: Diagnosis not present

## 2012-07-29 DIAGNOSIS — E119 Type 2 diabetes mellitus without complications: Secondary | ICD-10-CM | POA: Diagnosis not present

## 2012-07-29 DIAGNOSIS — J449 Chronic obstructive pulmonary disease, unspecified: Secondary | ICD-10-CM | POA: Diagnosis not present

## 2012-07-29 DIAGNOSIS — Z7901 Long term (current) use of anticoagulants: Secondary | ICD-10-CM | POA: Diagnosis not present

## 2012-07-29 DIAGNOSIS — J4489 Other specified chronic obstructive pulmonary disease: Secondary | ICD-10-CM | POA: Diagnosis not present

## 2012-08-08 DIAGNOSIS — Z79899 Other long term (current) drug therapy: Secondary | ICD-10-CM | POA: Diagnosis not present

## 2012-08-08 DIAGNOSIS — R82998 Other abnormal findings in urine: Secondary | ICD-10-CM | POA: Diagnosis not present

## 2012-08-08 DIAGNOSIS — N042 Nephrotic syndrome with diffuse membranous glomerulonephritis: Secondary | ICD-10-CM | POA: Diagnosis not present

## 2012-08-08 DIAGNOSIS — R700 Elevated erythrocyte sedimentation rate: Secondary | ICD-10-CM | POA: Diagnosis not present

## 2012-08-08 DIAGNOSIS — I10 Essential (primary) hypertension: Secondary | ICD-10-CM | POA: Diagnosis not present

## 2012-08-08 DIAGNOSIS — Z7901 Long term (current) use of anticoagulants: Secondary | ICD-10-CM | POA: Diagnosis not present

## 2012-08-08 DIAGNOSIS — N052 Unspecified nephritic syndrome with diffuse membranous glomerulonephritis: Secondary | ICD-10-CM | POA: Diagnosis not present

## 2012-08-09 DIAGNOSIS — N042 Nephrotic syndrome with diffuse membranous glomerulonephritis: Secondary | ICD-10-CM | POA: Diagnosis not present

## 2012-08-27 DIAGNOSIS — K219 Gastro-esophageal reflux disease without esophagitis: Secondary | ICD-10-CM | POA: Diagnosis not present

## 2012-08-27 DIAGNOSIS — Z7901 Long term (current) use of anticoagulants: Secondary | ICD-10-CM | POA: Diagnosis not present

## 2012-08-27 DIAGNOSIS — E039 Hypothyroidism, unspecified: Secondary | ICD-10-CM | POA: Diagnosis not present

## 2012-08-27 DIAGNOSIS — I803 Phlebitis and thrombophlebitis of lower extremities, unspecified: Secondary | ICD-10-CM | POA: Diagnosis not present

## 2012-08-27 DIAGNOSIS — Z6828 Body mass index (BMI) 28.0-28.9, adult: Secondary | ICD-10-CM | POA: Diagnosis not present

## 2012-08-28 DIAGNOSIS — M79609 Pain in unspecified limb: Secondary | ICD-10-CM | POA: Diagnosis not present

## 2012-08-28 DIAGNOSIS — B351 Tinea unguium: Secondary | ICD-10-CM | POA: Diagnosis not present

## 2012-09-12 DIAGNOSIS — R197 Diarrhea, unspecified: Secondary | ICD-10-CM | POA: Diagnosis not present

## 2012-09-12 DIAGNOSIS — N052 Unspecified nephritic syndrome with diffuse membranous glomerulonephritis: Secondary | ICD-10-CM | POA: Diagnosis not present

## 2012-09-12 DIAGNOSIS — I10 Essential (primary) hypertension: Secondary | ICD-10-CM | POA: Diagnosis not present

## 2012-09-12 DIAGNOSIS — N042 Nephrotic syndrome with diffuse membranous glomerulonephritis: Secondary | ICD-10-CM | POA: Diagnosis not present

## 2012-09-12 DIAGNOSIS — Z79899 Other long term (current) drug therapy: Secondary | ICD-10-CM | POA: Diagnosis not present

## 2012-09-20 DIAGNOSIS — N281 Cyst of kidney, acquired: Secondary | ICD-10-CM | POA: Diagnosis not present

## 2012-09-20 DIAGNOSIS — R7989 Other specified abnormal findings of blood chemistry: Secondary | ICD-10-CM | POA: Diagnosis not present

## 2012-09-20 DIAGNOSIS — R944 Abnormal results of kidney function studies: Secondary | ICD-10-CM | POA: Diagnosis not present

## 2012-09-20 DIAGNOSIS — N052 Unspecified nephritic syndrome with diffuse membranous glomerulonephritis: Secondary | ICD-10-CM | POA: Diagnosis not present

## 2012-09-20 DIAGNOSIS — R809 Proteinuria, unspecified: Secondary | ICD-10-CM | POA: Diagnosis not present

## 2012-09-26 DIAGNOSIS — Z882 Allergy status to sulfonamides status: Secondary | ICD-10-CM | POA: Diagnosis not present

## 2012-09-26 DIAGNOSIS — Z79899 Other long term (current) drug therapy: Secondary | ICD-10-CM | POA: Diagnosis not present

## 2012-09-26 DIAGNOSIS — Z7901 Long term (current) use of anticoagulants: Secondary | ICD-10-CM | POA: Diagnosis not present

## 2012-09-26 DIAGNOSIS — N052 Unspecified nephritic syndrome with diffuse membranous glomerulonephritis: Secondary | ICD-10-CM | POA: Diagnosis not present

## 2012-09-26 DIAGNOSIS — N042 Nephrotic syndrome with diffuse membranous glomerulonephritis: Secondary | ICD-10-CM | POA: Diagnosis not present

## 2012-09-26 DIAGNOSIS — Z888 Allergy status to other drugs, medicaments and biological substances status: Secondary | ICD-10-CM | POA: Diagnosis not present

## 2012-09-26 DIAGNOSIS — R944 Abnormal results of kidney function studies: Secondary | ICD-10-CM | POA: Diagnosis not present

## 2012-10-03 DIAGNOSIS — Z6828 Body mass index (BMI) 28.0-28.9, adult: Secondary | ICD-10-CM | POA: Diagnosis not present

## 2012-10-03 DIAGNOSIS — I10 Essential (primary) hypertension: Secondary | ICD-10-CM | POA: Diagnosis not present

## 2012-10-03 DIAGNOSIS — E039 Hypothyroidism, unspecified: Secondary | ICD-10-CM | POA: Diagnosis not present

## 2012-10-03 DIAGNOSIS — E785 Hyperlipidemia, unspecified: Secondary | ICD-10-CM | POA: Diagnosis not present

## 2012-10-03 DIAGNOSIS — E119 Type 2 diabetes mellitus without complications: Secondary | ICD-10-CM | POA: Diagnosis not present

## 2012-10-07 DIAGNOSIS — R197 Diarrhea, unspecified: Secondary | ICD-10-CM | POA: Diagnosis not present

## 2012-10-16 DIAGNOSIS — Z13820 Encounter for screening for osteoporosis: Secondary | ICD-10-CM | POA: Diagnosis not present

## 2012-10-24 DIAGNOSIS — N058 Unspecified nephritic syndrome with other morphologic changes: Secondary | ICD-10-CM | POA: Diagnosis not present

## 2012-10-24 DIAGNOSIS — Z7901 Long term (current) use of anticoagulants: Secondary | ICD-10-CM | POA: Diagnosis not present

## 2012-10-24 DIAGNOSIS — N042 Nephrotic syndrome with diffuse membranous glomerulonephritis, unspecified: Secondary | ICD-10-CM | POA: Diagnosis not present

## 2012-10-24 DIAGNOSIS — Z79899 Other long term (current) drug therapy: Secondary | ICD-10-CM | POA: Diagnosis not present

## 2012-10-24 DIAGNOSIS — Z882 Allergy status to sulfonamides status: Secondary | ICD-10-CM | POA: Diagnosis not present

## 2012-10-24 DIAGNOSIS — Z885 Allergy status to narcotic agent status: Secondary | ICD-10-CM | POA: Diagnosis not present

## 2012-10-24 DIAGNOSIS — E039 Hypothyroidism, unspecified: Secondary | ICD-10-CM | POA: Diagnosis not present

## 2012-11-05 DIAGNOSIS — I803 Phlebitis and thrombophlebitis of lower extremities, unspecified: Secondary | ICD-10-CM | POA: Diagnosis not present

## 2012-11-05 DIAGNOSIS — Z6827 Body mass index (BMI) 27.0-27.9, adult: Secondary | ICD-10-CM | POA: Diagnosis not present

## 2012-11-05 DIAGNOSIS — K219 Gastro-esophageal reflux disease without esophagitis: Secondary | ICD-10-CM | POA: Diagnosis not present

## 2012-11-05 DIAGNOSIS — E039 Hypothyroidism, unspecified: Secondary | ICD-10-CM | POA: Diagnosis not present

## 2012-11-05 DIAGNOSIS — R791 Abnormal coagulation profile: Secondary | ICD-10-CM | POA: Diagnosis not present

## 2012-11-05 DIAGNOSIS — J4489 Other specified chronic obstructive pulmonary disease: Secondary | ICD-10-CM | POA: Diagnosis not present

## 2012-11-05 DIAGNOSIS — E119 Type 2 diabetes mellitus without complications: Secondary | ICD-10-CM | POA: Diagnosis not present

## 2012-11-05 DIAGNOSIS — J449 Chronic obstructive pulmonary disease, unspecified: Secondary | ICD-10-CM | POA: Diagnosis not present

## 2012-11-12 DIAGNOSIS — R791 Abnormal coagulation profile: Secondary | ICD-10-CM | POA: Diagnosis not present

## 2012-11-19 DIAGNOSIS — R791 Abnormal coagulation profile: Secondary | ICD-10-CM | POA: Diagnosis not present

## 2012-11-26 DIAGNOSIS — R791 Abnormal coagulation profile: Secondary | ICD-10-CM | POA: Diagnosis not present

## 2012-11-27 DIAGNOSIS — B351 Tinea unguium: Secondary | ICD-10-CM | POA: Diagnosis not present

## 2012-11-27 DIAGNOSIS — M79609 Pain in unspecified limb: Secondary | ICD-10-CM | POA: Diagnosis not present

## 2012-11-28 DIAGNOSIS — Z7901 Long term (current) use of anticoagulants: Secondary | ICD-10-CM | POA: Diagnosis not present

## 2012-11-28 DIAGNOSIS — Z79899 Other long term (current) drug therapy: Secondary | ICD-10-CM | POA: Diagnosis not present

## 2012-11-28 DIAGNOSIS — N184 Chronic kidney disease, stage 4 (severe): Secondary | ICD-10-CM | POA: Diagnosis not present

## 2012-11-28 DIAGNOSIS — N052 Unspecified nephritic syndrome with diffuse membranous glomerulonephritis: Secondary | ICD-10-CM | POA: Diagnosis not present

## 2012-11-28 DIAGNOSIS — Z882 Allergy status to sulfonamides status: Secondary | ICD-10-CM | POA: Diagnosis not present

## 2012-12-04 DIAGNOSIS — Z6828 Body mass index (BMI) 28.0-28.9, adult: Secondary | ICD-10-CM | POA: Diagnosis not present

## 2012-12-04 DIAGNOSIS — E039 Hypothyroidism, unspecified: Secondary | ICD-10-CM | POA: Diagnosis not present

## 2012-12-04 DIAGNOSIS — I10 Essential (primary) hypertension: Secondary | ICD-10-CM | POA: Diagnosis not present

## 2012-12-04 DIAGNOSIS — K219 Gastro-esophageal reflux disease without esophagitis: Secondary | ICD-10-CM | POA: Diagnosis not present

## 2013-01-03 DIAGNOSIS — H95199 Other disorders following mastoidectomy, unspecified ear: Secondary | ICD-10-CM | POA: Diagnosis not present

## 2013-01-03 DIAGNOSIS — K219 Gastro-esophageal reflux disease without esophagitis: Secondary | ICD-10-CM | POA: Diagnosis not present

## 2013-01-03 DIAGNOSIS — E039 Hypothyroidism, unspecified: Secondary | ICD-10-CM | POA: Diagnosis not present

## 2013-01-03 DIAGNOSIS — Z6828 Body mass index (BMI) 28.0-28.9, adult: Secondary | ICD-10-CM | POA: Diagnosis not present

## 2013-01-03 DIAGNOSIS — E119 Type 2 diabetes mellitus without complications: Secondary | ICD-10-CM | POA: Diagnosis not present

## 2013-01-03 DIAGNOSIS — Z7901 Long term (current) use of anticoagulants: Secondary | ICD-10-CM | POA: Diagnosis not present

## 2013-01-16 DIAGNOSIS — H9319 Tinnitus, unspecified ear: Secondary | ICD-10-CM | POA: Diagnosis not present

## 2013-01-16 DIAGNOSIS — H9190 Unspecified hearing loss, unspecified ear: Secondary | ICD-10-CM | POA: Diagnosis not present

## 2013-01-16 DIAGNOSIS — H905 Unspecified sensorineural hearing loss: Secondary | ICD-10-CM | POA: Diagnosis not present

## 2013-01-16 DIAGNOSIS — J342 Deviated nasal septum: Secondary | ICD-10-CM | POA: Diagnosis not present

## 2013-01-16 DIAGNOSIS — H903 Sensorineural hearing loss, bilateral: Secondary | ICD-10-CM | POA: Diagnosis not present

## 2013-01-23 DIAGNOSIS — Z7901 Long term (current) use of anticoagulants: Secondary | ICD-10-CM | POA: Diagnosis not present

## 2013-01-23 DIAGNOSIS — Z885 Allergy status to narcotic agent status: Secondary | ICD-10-CM | POA: Diagnosis not present

## 2013-01-23 DIAGNOSIS — N185 Chronic kidney disease, stage 5: Secondary | ICD-10-CM | POA: Diagnosis not present

## 2013-01-23 DIAGNOSIS — Z882 Allergy status to sulfonamides status: Secondary | ICD-10-CM | POA: Diagnosis not present

## 2013-01-23 DIAGNOSIS — Z79899 Other long term (current) drug therapy: Secondary | ICD-10-CM | POA: Diagnosis not present

## 2013-01-23 DIAGNOSIS — N184 Chronic kidney disease, stage 4 (severe): Secondary | ICD-10-CM | POA: Diagnosis not present

## 2013-01-23 DIAGNOSIS — I129 Hypertensive chronic kidney disease with stage 1 through stage 4 chronic kidney disease, or unspecified chronic kidney disease: Secondary | ICD-10-CM | POA: Diagnosis not present

## 2013-01-31 DIAGNOSIS — E039 Hypothyroidism, unspecified: Secondary | ICD-10-CM | POA: Diagnosis not present

## 2013-01-31 DIAGNOSIS — I10 Essential (primary) hypertension: Secondary | ICD-10-CM | POA: Diagnosis not present

## 2013-01-31 DIAGNOSIS — E119 Type 2 diabetes mellitus without complications: Secondary | ICD-10-CM | POA: Diagnosis not present

## 2013-01-31 DIAGNOSIS — K219 Gastro-esophageal reflux disease without esophagitis: Secondary | ICD-10-CM | POA: Diagnosis not present

## 2013-01-31 DIAGNOSIS — Z6828 Body mass index (BMI) 28.0-28.9, adult: Secondary | ICD-10-CM | POA: Diagnosis not present

## 2013-02-10 DIAGNOSIS — E039 Hypothyroidism, unspecified: Secondary | ICD-10-CM | POA: Diagnosis not present

## 2013-02-10 DIAGNOSIS — J449 Chronic obstructive pulmonary disease, unspecified: Secondary | ICD-10-CM | POA: Diagnosis not present

## 2013-02-10 DIAGNOSIS — N189 Chronic kidney disease, unspecified: Secondary | ICD-10-CM | POA: Diagnosis not present

## 2013-02-10 DIAGNOSIS — I10 Essential (primary) hypertension: Secondary | ICD-10-CM | POA: Diagnosis not present

## 2013-02-10 DIAGNOSIS — E119 Type 2 diabetes mellitus without complications: Secondary | ICD-10-CM | POA: Diagnosis not present

## 2013-02-10 DIAGNOSIS — Z6828 Body mass index (BMI) 28.0-28.9, adult: Secondary | ICD-10-CM | POA: Diagnosis not present

## 2013-02-20 DIAGNOSIS — N184 Chronic kidney disease, stage 4 (severe): Secondary | ICD-10-CM | POA: Diagnosis not present

## 2013-02-20 DIAGNOSIS — Z882 Allergy status to sulfonamides status: Secondary | ICD-10-CM | POA: Diagnosis not present

## 2013-02-20 DIAGNOSIS — Z7901 Long term (current) use of anticoagulants: Secondary | ICD-10-CM | POA: Diagnosis not present

## 2013-02-20 DIAGNOSIS — I129 Hypertensive chronic kidney disease with stage 1 through stage 4 chronic kidney disease, or unspecified chronic kidney disease: Secondary | ICD-10-CM | POA: Diagnosis not present

## 2013-02-20 DIAGNOSIS — N185 Chronic kidney disease, stage 5: Secondary | ICD-10-CM | POA: Diagnosis not present

## 2013-02-20 DIAGNOSIS — R197 Diarrhea, unspecified: Secondary | ICD-10-CM | POA: Diagnosis not present

## 2013-02-20 DIAGNOSIS — I10 Essential (primary) hypertension: Secondary | ICD-10-CM | POA: Diagnosis not present

## 2013-02-20 DIAGNOSIS — Z885 Allergy status to narcotic agent status: Secondary | ICD-10-CM | POA: Diagnosis not present

## 2013-02-20 DIAGNOSIS — Z79899 Other long term (current) drug therapy: Secondary | ICD-10-CM | POA: Diagnosis not present

## 2013-02-26 DIAGNOSIS — M79609 Pain in unspecified limb: Secondary | ICD-10-CM | POA: Diagnosis not present

## 2013-02-26 DIAGNOSIS — B351 Tinea unguium: Secondary | ICD-10-CM | POA: Diagnosis not present

## 2013-02-28 DIAGNOSIS — Z7901 Long term (current) use of anticoagulants: Secondary | ICD-10-CM | POA: Diagnosis not present

## 2013-03-20 DIAGNOSIS — Z882 Allergy status to sulfonamides status: Secondary | ICD-10-CM | POA: Diagnosis not present

## 2013-03-20 DIAGNOSIS — Z8249 Family history of ischemic heart disease and other diseases of the circulatory system: Secondary | ICD-10-CM | POA: Diagnosis not present

## 2013-03-20 DIAGNOSIS — Z885 Allergy status to narcotic agent status: Secondary | ICD-10-CM | POA: Diagnosis not present

## 2013-03-20 DIAGNOSIS — Z719 Counseling, unspecified: Secondary | ICD-10-CM | POA: Diagnosis not present

## 2013-03-20 DIAGNOSIS — I120 Hypertensive chronic kidney disease with stage 5 chronic kidney disease or end stage renal disease: Secondary | ICD-10-CM | POA: Diagnosis not present

## 2013-03-20 DIAGNOSIS — N185 Chronic kidney disease, stage 5: Secondary | ICD-10-CM | POA: Diagnosis not present

## 2013-03-20 DIAGNOSIS — N052 Unspecified nephritic syndrome with diffuse membranous glomerulonephritis: Secondary | ICD-10-CM | POA: Diagnosis not present

## 2013-03-20 DIAGNOSIS — Z7901 Long term (current) use of anticoagulants: Secondary | ICD-10-CM | POA: Diagnosis not present

## 2013-03-20 DIAGNOSIS — Z86718 Personal history of other venous thrombosis and embolism: Secondary | ICD-10-CM | POA: Diagnosis not present

## 2013-03-20 DIAGNOSIS — Z79899 Other long term (current) drug therapy: Secondary | ICD-10-CM | POA: Diagnosis not present

## 2013-04-04 DIAGNOSIS — K219 Gastro-esophageal reflux disease without esophagitis: Secondary | ICD-10-CM | POA: Diagnosis not present

## 2013-04-04 DIAGNOSIS — Z6827 Body mass index (BMI) 27.0-27.9, adult: Secondary | ICD-10-CM | POA: Diagnosis not present

## 2013-04-04 DIAGNOSIS — E785 Hyperlipidemia, unspecified: Secondary | ICD-10-CM | POA: Diagnosis not present

## 2013-04-04 DIAGNOSIS — R791 Abnormal coagulation profile: Secondary | ICD-10-CM | POA: Diagnosis not present

## 2013-04-04 DIAGNOSIS — N189 Chronic kidney disease, unspecified: Secondary | ICD-10-CM | POA: Diagnosis not present

## 2013-04-04 DIAGNOSIS — E039 Hypothyroidism, unspecified: Secondary | ICD-10-CM | POA: Diagnosis not present

## 2013-04-04 DIAGNOSIS — Z23 Encounter for immunization: Secondary | ICD-10-CM | POA: Diagnosis not present

## 2013-04-04 DIAGNOSIS — E119 Type 2 diabetes mellitus without complications: Secondary | ICD-10-CM | POA: Diagnosis not present

## 2013-04-17 DIAGNOSIS — I498 Other specified cardiac arrhythmias: Secondary | ICD-10-CM | POA: Diagnosis not present

## 2013-04-17 DIAGNOSIS — N189 Chronic kidney disease, unspecified: Secondary | ICD-10-CM | POA: Diagnosis not present

## 2013-05-05 DIAGNOSIS — E119 Type 2 diabetes mellitus without complications: Secondary | ICD-10-CM | POA: Diagnosis not present

## 2013-05-05 DIAGNOSIS — N189 Chronic kidney disease, unspecified: Secondary | ICD-10-CM | POA: Diagnosis not present

## 2013-05-05 DIAGNOSIS — K219 Gastro-esophageal reflux disease without esophagitis: Secondary | ICD-10-CM | POA: Diagnosis not present

## 2013-05-05 DIAGNOSIS — Z6827 Body mass index (BMI) 27.0-27.9, adult: Secondary | ICD-10-CM | POA: Diagnosis not present

## 2013-05-05 DIAGNOSIS — E039 Hypothyroidism, unspecified: Secondary | ICD-10-CM | POA: Diagnosis not present

## 2013-05-13 DIAGNOSIS — Z7982 Long term (current) use of aspirin: Secondary | ICD-10-CM | POA: Diagnosis not present

## 2013-05-13 DIAGNOSIS — K219 Gastro-esophageal reflux disease without esophagitis: Secondary | ICD-10-CM | POA: Diagnosis not present

## 2013-05-13 DIAGNOSIS — I120 Hypertensive chronic kidney disease with stage 5 chronic kidney disease or end stage renal disease: Secondary | ICD-10-CM | POA: Diagnosis not present

## 2013-05-13 DIAGNOSIS — Z992 Dependence on renal dialysis: Secondary | ICD-10-CM | POA: Diagnosis not present

## 2013-05-13 DIAGNOSIS — Z9089 Acquired absence of other organs: Secondary | ICD-10-CM | POA: Diagnosis not present

## 2013-05-13 DIAGNOSIS — E119 Type 2 diabetes mellitus without complications: Secondary | ICD-10-CM | POA: Diagnosis not present

## 2013-05-13 DIAGNOSIS — Z86718 Personal history of other venous thrombosis and embolism: Secondary | ICD-10-CM | POA: Diagnosis not present

## 2013-05-13 DIAGNOSIS — Z79899 Other long term (current) drug therapy: Secondary | ICD-10-CM | POA: Diagnosis not present

## 2013-05-13 DIAGNOSIS — N185 Chronic kidney disease, stage 5: Secondary | ICD-10-CM | POA: Diagnosis not present

## 2013-05-13 DIAGNOSIS — Z882 Allergy status to sulfonamides status: Secondary | ICD-10-CM | POA: Diagnosis not present

## 2013-05-13 DIAGNOSIS — E039 Hypothyroidism, unspecified: Secondary | ICD-10-CM | POA: Diagnosis not present

## 2013-05-13 DIAGNOSIS — G609 Hereditary and idiopathic neuropathy, unspecified: Secondary | ICD-10-CM | POA: Diagnosis not present

## 2013-05-13 DIAGNOSIS — Z885 Allergy status to narcotic agent status: Secondary | ICD-10-CM | POA: Diagnosis not present

## 2013-05-13 DIAGNOSIS — N189 Chronic kidney disease, unspecified: Secondary | ICD-10-CM | POA: Diagnosis not present

## 2013-05-15 DIAGNOSIS — Z882 Allergy status to sulfonamides status: Secondary | ICD-10-CM | POA: Diagnosis not present

## 2013-05-15 DIAGNOSIS — Z79899 Other long term (current) drug therapy: Secondary | ICD-10-CM | POA: Diagnosis not present

## 2013-05-15 DIAGNOSIS — N185 Chronic kidney disease, stage 5: Secondary | ICD-10-CM | POA: Diagnosis not present

## 2013-05-15 DIAGNOSIS — I120 Hypertensive chronic kidney disease with stage 5 chronic kidney disease or end stage renal disease: Secondary | ICD-10-CM | POA: Diagnosis not present

## 2013-05-15 DIAGNOSIS — Z885 Allergy status to narcotic agent status: Secondary | ICD-10-CM | POA: Diagnosis not present

## 2013-05-15 DIAGNOSIS — Z7901 Long term (current) use of anticoagulants: Secondary | ICD-10-CM | POA: Diagnosis not present

## 2013-05-28 ENCOUNTER — Ambulatory Visit (INDEPENDENT_AMBULATORY_CARE_PROVIDER_SITE_OTHER): Payer: Medicare Other

## 2013-05-28 ENCOUNTER — Encounter (INDEPENDENT_AMBULATORY_CARE_PROVIDER_SITE_OTHER): Payer: Self-pay

## 2013-05-28 VITALS — BP 159/74 | HR 52 | Resp 16

## 2013-05-28 DIAGNOSIS — L608 Other nail disorders: Secondary | ICD-10-CM | POA: Diagnosis not present

## 2013-05-28 DIAGNOSIS — I10 Essential (primary) hypertension: Secondary | ICD-10-CM

## 2013-05-28 DIAGNOSIS — E079 Disorder of thyroid, unspecified: Secondary | ICD-10-CM

## 2013-05-28 DIAGNOSIS — E118 Type 2 diabetes mellitus with unspecified complications: Secondary | ICD-10-CM | POA: Diagnosis not present

## 2013-05-28 DIAGNOSIS — N19 Unspecified kidney failure: Secondary | ICD-10-CM

## 2013-05-28 NOTE — Progress Notes (Signed)
  Subjective:    Patient ID: Linda Jordan, female    DOB: [birth year], 79 y.o.   MRN: [id number]  HPI trim my nails Past history medical for diet-controlled diabetes, with history of decreased epicritic sensation to the forefoot and digits. Patient also has history of renal failure likely secondary to her hypertension, is scheduled to begin dialysis in the near future/within the next month.   Review of Systems  Constitutional: Negative.   HENT: Negative.   Eyes: Negative.   Cardiovascular: Negative.   Gastrointestinal: Negative.   Endocrine: Positive for cold intolerance.  Genitourinary: Negative.   Musculoskeletal: Negative.   Skin: Negative.   Allergic/Immunologic: Negative.   Neurological: Negative.   Hematological: Negative.   Psychiatric/Behavioral: Negative.        Objective:   Physical Exam Warfarin objective findings as follows pedal pulses palpable DP pulse two over four bilateral PT thready pulse one over 4 bilateral. Refill time 3 seconds all digits skin temperature warm turgor normal no edema noted neurologically epicritic and proprioceptive sensations intact although diminished on Semmes Weinstein testing to the digits and plantar forefoot. Normal plantar response and DTRs otherwise noted. Orthopedic biomechanical exam reveals rectus foot type mild flexible digital contractures are noted. Remainder of dermatologic exam reveals thickened and criptotic nails no secondary infection no open wounds or ulcerations are noted.     Assessment & Plan:  Diabetes with neuropathy, history complications including impending renal dialysis. Patient is thick brittle criptotic nails debrided x10 the presence of diabetes and complications Linda Jordan for future palliative care and as-needed basis suggest a 3 month followup contact us if any changes or exacerbations occur  Linda Jordan DPM

## 2013-05-28 NOTE — Patient Instructions (Signed)
Diabetes and Foot Care Diabetes may cause you to have problems because of poor blood supply (circulation) to your feet and legs. This may cause the skin on your feet to become thinner, break easier, and heal more slowly. Your skin may become dry, and the skin may peel and crack. You may also have nerve damage in your legs and feet causing decreased feeling in them. You may not notice minor injuries to your feet that could lead to infections or more serious problems. Taking care of your feet is one of the most important things you can do for yourself.  HOME CARE INSTRUCTIONS  Wear shoes at all times, even in the house. Do not go barefoot. Bare feet are easily injured.  Check your feet daily for blisters, cuts, and redness. If you cannot see the bottom of your feet, use a mirror or ask someone for help.  Wash your feet with warm water (do not use hot water) and mild soap. Then pat your feet and the areas between your toes until they are completely dry. Do not soak your feet as this can dry your skin.  Apply a moisturizing lotion or petroleum jelly (that does not contain alcohol and is unscented) to the skin on your feet and to dry, brittle toenails. Do not apply lotion between your toes.  Trim your toenails straight across. Do not dig under them or around the cuticle. File the edges of your nails with an emery board or nail file.  Do not cut corns or calluses or try to remove them with medicine.  Wear clean socks or stockings every day. Make sure they are not too tight. Do not wear knee-high stockings since they may decrease blood flow to your legs.  Wear shoes that fit properly and have enough cushioning. To break in new shoes, wear them for just a few hours a day. This prevents you from injuring your feet. Always look in your shoes before you put them on to be sure there are no objects inside.  Do not cross your legs. This may decrease the blood flow to your feet.  If you find a minor scrape,  cut, or break in the skin on your feet, keep it and the skin around it clean and dry. These areas may be cleansed with mild soap and water. Do not cleanse the area with peroxide, alcohol, or iodine.  When you remove an adhesive bandage, be sure not to damage the skin around it.  If you have a wound, look at it several times a day to make sure it is healing.  Do not use heating pads or hot water bottles. They may burn your skin. If you have lost feeling in your feet or legs, you may not know it is happening until it is too late.  Make sure your health care provider performs a complete foot exam at least annually or more often if you have foot problems. Report any cuts, sores, or bruises to your health care provider immediately. SEEK MEDICAL CARE IF:   You have an injury that is not healing.  You have cuts or breaks in the skin.  You have an ingrown nail.  You notice redness on your legs or feet.  You feel burning or tingling in your legs or feet.  You have pain or cramps in your legs and feet.  Your legs or feet are numb.  Your feet always feel cold. SEEK IMMEDIATE MEDICAL CARE IF:   There is increasing redness,   swelling, or pain in or around a wound.  There is a red line that goes up your leg.  Pus is coming from a wound.  You develop a fever or as directed by your health care provider.  You notice a bad smell coming from an ulcer or wound. Document Released: 06/23/2000 Document Revised: 02/26/2013 Document Reviewed: 12/03/2012 Exit[employer] Patient Information 2014 [employer].  

## 2013-06-04 DIAGNOSIS — E1129 Type 2 diabetes mellitus with other diabetic kidney complication: Secondary | ICD-10-CM | POA: Diagnosis not present

## 2013-06-04 DIAGNOSIS — N186 End stage renal disease: Secondary | ICD-10-CM | POA: Diagnosis not present

## 2013-06-04 DIAGNOSIS — K219 Gastro-esophageal reflux disease without esophagitis: Secondary | ICD-10-CM | POA: Diagnosis not present

## 2013-06-04 DIAGNOSIS — E039 Hypothyroidism, unspecified: Secondary | ICD-10-CM | POA: Diagnosis not present

## 2013-06-17 DIAGNOSIS — K759 Inflammatory liver disease, unspecified: Secondary | ICD-10-CM | POA: Diagnosis not present

## 2013-06-17 DIAGNOSIS — N186 End stage renal disease: Secondary | ICD-10-CM | POA: Diagnosis not present

## 2013-06-19 DIAGNOSIS — K759 Inflammatory liver disease, unspecified: Secondary | ICD-10-CM | POA: Diagnosis not present

## 2013-06-19 DIAGNOSIS — N186 End stage renal disease: Secondary | ICD-10-CM | POA: Diagnosis not present

## 2013-06-20 DIAGNOSIS — K759 Inflammatory liver disease, unspecified: Secondary | ICD-10-CM | POA: Diagnosis not present

## 2013-06-20 DIAGNOSIS — N186 End stage renal disease: Secondary | ICD-10-CM | POA: Diagnosis not present

## 2013-06-23 DIAGNOSIS — K759 Inflammatory liver disease, unspecified: Secondary | ICD-10-CM | POA: Diagnosis not present

## 2013-06-23 DIAGNOSIS — N186 End stage renal disease: Secondary | ICD-10-CM | POA: Diagnosis not present

## 2013-06-24 DIAGNOSIS — N186 End stage renal disease: Secondary | ICD-10-CM | POA: Diagnosis not present

## 2013-06-24 DIAGNOSIS — K759 Inflammatory liver disease, unspecified: Secondary | ICD-10-CM | POA: Diagnosis not present

## 2013-06-26 DIAGNOSIS — N186 End stage renal disease: Secondary | ICD-10-CM | POA: Diagnosis not present

## 2013-06-26 DIAGNOSIS — K759 Inflammatory liver disease, unspecified: Secondary | ICD-10-CM | POA: Diagnosis not present

## 2013-06-27 DIAGNOSIS — N186 End stage renal disease: Secondary | ICD-10-CM | POA: Diagnosis not present

## 2013-06-28 DIAGNOSIS — N186 End stage renal disease: Secondary | ICD-10-CM | POA: Diagnosis not present

## 2013-06-29 DIAGNOSIS — N186 End stage renal disease: Secondary | ICD-10-CM | POA: Diagnosis not present

## 2013-06-30 DIAGNOSIS — N186 End stage renal disease: Secondary | ICD-10-CM | POA: Diagnosis not present

## 2013-07-01 DIAGNOSIS — N186 End stage renal disease: Secondary | ICD-10-CM | POA: Diagnosis not present

## 2013-07-02 DIAGNOSIS — E1129 Type 2 diabetes mellitus with other diabetic kidney complication: Secondary | ICD-10-CM | POA: Diagnosis not present

## 2013-07-02 DIAGNOSIS — I10 Essential (primary) hypertension: Secondary | ICD-10-CM | POA: Diagnosis not present

## 2013-07-02 DIAGNOSIS — N186 End stage renal disease: Secondary | ICD-10-CM | POA: Diagnosis not present

## 2013-07-03 DIAGNOSIS — N186 End stage renal disease: Secondary | ICD-10-CM | POA: Diagnosis not present

## 2013-07-04 DIAGNOSIS — N186 End stage renal disease: Secondary | ICD-10-CM | POA: Diagnosis not present

## 2013-07-04 DIAGNOSIS — I10 Essential (primary) hypertension: Secondary | ICD-10-CM | POA: Diagnosis not present

## 2013-07-04 DIAGNOSIS — R109 Unspecified abdominal pain: Secondary | ICD-10-CM | POA: Diagnosis not present

## 2013-07-04 DIAGNOSIS — I129 Hypertensive chronic kidney disease with stage 1 through stage 4 chronic kidney disease, or unspecified chronic kidney disease: Secondary | ICD-10-CM | POA: Diagnosis not present

## 2013-07-04 DIAGNOSIS — K659 Peritonitis, unspecified: Secondary | ICD-10-CM | POA: Diagnosis not present

## 2013-07-04 DIAGNOSIS — Z882 Allergy status to sulfonamides status: Secondary | ICD-10-CM | POA: Diagnosis not present

## 2013-07-04 DIAGNOSIS — K560 Paralytic ileus: Secondary | ICD-10-CM | POA: Diagnosis not present

## 2013-07-04 DIAGNOSIS — IMO0002 Reserved for concepts with insufficient information to code with codable children: Secondary | ICD-10-CM | POA: Diagnosis present

## 2013-07-04 DIAGNOSIS — I120 Hypertensive chronic kidney disease with stage 5 chronic kidney disease or end stage renal disease: Secondary | ICD-10-CM | POA: Diagnosis not present

## 2013-07-04 DIAGNOSIS — E039 Hypothyroidism, unspecified: Secondary | ICD-10-CM | POA: Diagnosis not present

## 2013-07-04 DIAGNOSIS — Z992 Dependence on renal dialysis: Secondary | ICD-10-CM | POA: Diagnosis not present

## 2013-07-04 DIAGNOSIS — N184 Chronic kidney disease, stage 4 (severe): Secondary | ICD-10-CM | POA: Diagnosis not present

## 2013-07-04 DIAGNOSIS — K658 Other peritonitis: Secondary | ICD-10-CM | POA: Diagnosis present

## 2013-07-04 DIAGNOSIS — K650 Generalized (acute) peritonitis: Secondary | ICD-10-CM | POA: Diagnosis not present

## 2013-07-04 DIAGNOSIS — Z8249 Family history of ischemic heart disease and other diseases of the circulatory system: Secondary | ICD-10-CM | POA: Diagnosis not present

## 2013-07-04 DIAGNOSIS — R1031 Right lower quadrant pain: Secondary | ICD-10-CM | POA: Diagnosis not present

## 2013-07-04 DIAGNOSIS — Z87441 Personal history of nephrotic syndrome: Secondary | ICD-10-CM | POA: Diagnosis not present

## 2013-07-04 DIAGNOSIS — Z86718 Personal history of other venous thrombosis and embolism: Secondary | ICD-10-CM | POA: Diagnosis not present

## 2013-07-04 DIAGNOSIS — R5381 Other malaise: Secondary | ICD-10-CM | POA: Diagnosis not present

## 2013-07-04 DIAGNOSIS — N281 Cyst of kidney, acquired: Secondary | ICD-10-CM | POA: Diagnosis not present

## 2013-07-05 DIAGNOSIS — N186 End stage renal disease: Secondary | ICD-10-CM | POA: Diagnosis not present

## 2013-07-05 DIAGNOSIS — K659 Peritonitis, unspecified: Secondary | ICD-10-CM | POA: Insufficient documentation

## 2013-07-07 DIAGNOSIS — N186 End stage renal disease: Secondary | ICD-10-CM | POA: Diagnosis not present

## 2013-07-08 DIAGNOSIS — N186 End stage renal disease: Secondary | ICD-10-CM | POA: Diagnosis not present

## 2013-07-09 DIAGNOSIS — N186 End stage renal disease: Secondary | ICD-10-CM | POA: Diagnosis not present

## 2013-07-09 DIAGNOSIS — Z5181 Encounter for therapeutic drug level monitoring: Secondary | ICD-10-CM | POA: Diagnosis not present

## 2013-07-10 DIAGNOSIS — N186 End stage renal disease: Secondary | ICD-10-CM | POA: Diagnosis not present

## 2013-07-14 DIAGNOSIS — Z792 Long term (current) use of antibiotics: Secondary | ICD-10-CM | POA: Diagnosis not present

## 2013-07-14 DIAGNOSIS — Z5181 Encounter for therapeutic drug level monitoring: Secondary | ICD-10-CM | POA: Diagnosis not present

## 2013-07-16 DIAGNOSIS — Z5181 Encounter for therapeutic drug level monitoring: Secondary | ICD-10-CM | POA: Diagnosis not present

## 2013-07-23 DIAGNOSIS — M47817 Spondylosis without myelopathy or radiculopathy, lumbosacral region: Secondary | ICD-10-CM | POA: Diagnosis not present

## 2013-07-23 DIAGNOSIS — K8689 Other specified diseases of pancreas: Secondary | ICD-10-CM | POA: Diagnosis not present

## 2013-07-23 DIAGNOSIS — R188 Other ascites: Secondary | ICD-10-CM | POA: Diagnosis not present

## 2013-07-23 DIAGNOSIS — N281 Cyst of kidney, acquired: Secondary | ICD-10-CM | POA: Diagnosis not present

## 2013-07-23 DIAGNOSIS — R918 Other nonspecific abnormal finding of lung field: Secondary | ICD-10-CM | POA: Diagnosis not present

## 2013-07-23 DIAGNOSIS — R1031 Right lower quadrant pain: Secondary | ICD-10-CM | POA: Diagnosis not present

## 2013-07-23 DIAGNOSIS — K838 Other specified diseases of biliary tract: Secondary | ICD-10-CM | POA: Diagnosis not present

## 2013-08-07 DIAGNOSIS — E119 Type 2 diabetes mellitus without complications: Secondary | ICD-10-CM | POA: Diagnosis not present

## 2013-08-07 DIAGNOSIS — E039 Hypothyroidism, unspecified: Secondary | ICD-10-CM | POA: Diagnosis not present

## 2013-08-07 DIAGNOSIS — J449 Chronic obstructive pulmonary disease, unspecified: Secondary | ICD-10-CM | POA: Diagnosis not present

## 2013-08-07 DIAGNOSIS — M545 Low back pain, unspecified: Secondary | ICD-10-CM | POA: Diagnosis not present

## 2013-08-07 DIAGNOSIS — Z1331 Encounter for screening for depression: Secondary | ICD-10-CM | POA: Diagnosis not present

## 2013-08-07 DIAGNOSIS — Z9181 History of falling: Secondary | ICD-10-CM | POA: Diagnosis not present

## 2013-08-07 DIAGNOSIS — J309 Allergic rhinitis, unspecified: Secondary | ICD-10-CM | POA: Diagnosis not present

## 2013-08-09 DIAGNOSIS — N186 End stage renal disease: Secondary | ICD-10-CM | POA: Diagnosis not present

## 2013-08-10 DIAGNOSIS — N186 End stage renal disease: Secondary | ICD-10-CM | POA: Diagnosis not present

## 2013-08-10 DIAGNOSIS — K759 Inflammatory liver disease, unspecified: Secondary | ICD-10-CM | POA: Diagnosis not present

## 2013-08-11 DIAGNOSIS — N186 End stage renal disease: Secondary | ICD-10-CM | POA: Diagnosis not present

## 2013-08-11 DIAGNOSIS — K759 Inflammatory liver disease, unspecified: Secondary | ICD-10-CM | POA: Diagnosis not present

## 2013-08-12 DIAGNOSIS — K759 Inflammatory liver disease, unspecified: Secondary | ICD-10-CM | POA: Diagnosis not present

## 2013-08-12 DIAGNOSIS — N186 End stage renal disease: Secondary | ICD-10-CM | POA: Diagnosis not present

## 2013-08-13 DIAGNOSIS — N186 End stage renal disease: Secondary | ICD-10-CM | POA: Diagnosis not present

## 2013-08-13 DIAGNOSIS — K759 Inflammatory liver disease, unspecified: Secondary | ICD-10-CM | POA: Diagnosis not present

## 2013-08-13 DIAGNOSIS — R0989 Other specified symptoms and signs involving the circulatory and respiratory systems: Secondary | ICD-10-CM | POA: Diagnosis not present

## 2013-08-14 DIAGNOSIS — K759 Inflammatory liver disease, unspecified: Secondary | ICD-10-CM | POA: Diagnosis not present

## 2013-08-14 DIAGNOSIS — N186 End stage renal disease: Secondary | ICD-10-CM | POA: Diagnosis not present

## 2013-08-15 DIAGNOSIS — K759 Inflammatory liver disease, unspecified: Secondary | ICD-10-CM | POA: Diagnosis not present

## 2013-08-15 DIAGNOSIS — N186 End stage renal disease: Secondary | ICD-10-CM | POA: Diagnosis not present

## 2013-08-16 DIAGNOSIS — N186 End stage renal disease: Secondary | ICD-10-CM | POA: Diagnosis not present

## 2013-08-16 DIAGNOSIS — K759 Inflammatory liver disease, unspecified: Secondary | ICD-10-CM | POA: Diagnosis not present

## 2013-08-17 DIAGNOSIS — N186 End stage renal disease: Secondary | ICD-10-CM | POA: Diagnosis not present

## 2013-08-17 DIAGNOSIS — K759 Inflammatory liver disease, unspecified: Secondary | ICD-10-CM | POA: Diagnosis not present

## 2013-08-18 DIAGNOSIS — N186 End stage renal disease: Secondary | ICD-10-CM | POA: Diagnosis not present

## 2013-08-18 DIAGNOSIS — K759 Inflammatory liver disease, unspecified: Secondary | ICD-10-CM | POA: Diagnosis not present

## 2013-08-19 DIAGNOSIS — N186 End stage renal disease: Secondary | ICD-10-CM | POA: Diagnosis not present

## 2013-08-19 DIAGNOSIS — K759 Inflammatory liver disease, unspecified: Secondary | ICD-10-CM | POA: Diagnosis not present

## 2013-08-20 DIAGNOSIS — N186 End stage renal disease: Secondary | ICD-10-CM | POA: Diagnosis not present

## 2013-08-20 DIAGNOSIS — K759 Inflammatory liver disease, unspecified: Secondary | ICD-10-CM | POA: Diagnosis not present

## 2013-08-21 DIAGNOSIS — K759 Inflammatory liver disease, unspecified: Secondary | ICD-10-CM | POA: Diagnosis not present

## 2013-08-21 DIAGNOSIS — N186 End stage renal disease: Secondary | ICD-10-CM | POA: Diagnosis not present

## 2013-08-22 DIAGNOSIS — N186 End stage renal disease: Secondary | ICD-10-CM | POA: Diagnosis not present

## 2013-08-22 DIAGNOSIS — K759 Inflammatory liver disease, unspecified: Secondary | ICD-10-CM | POA: Diagnosis not present

## 2013-08-23 DIAGNOSIS — K759 Inflammatory liver disease, unspecified: Secondary | ICD-10-CM | POA: Diagnosis not present

## 2013-08-23 DIAGNOSIS — N186 End stage renal disease: Secondary | ICD-10-CM | POA: Diagnosis not present

## 2013-08-24 DIAGNOSIS — N186 End stage renal disease: Secondary | ICD-10-CM | POA: Diagnosis not present

## 2013-08-24 DIAGNOSIS — K759 Inflammatory liver disease, unspecified: Secondary | ICD-10-CM | POA: Diagnosis not present

## 2013-08-25 DIAGNOSIS — K759 Inflammatory liver disease, unspecified: Secondary | ICD-10-CM | POA: Diagnosis not present

## 2013-08-25 DIAGNOSIS — N186 End stage renal disease: Secondary | ICD-10-CM | POA: Diagnosis not present

## 2013-08-26 DIAGNOSIS — K759 Inflammatory liver disease, unspecified: Secondary | ICD-10-CM | POA: Diagnosis not present

## 2013-08-26 DIAGNOSIS — N186 End stage renal disease: Secondary | ICD-10-CM | POA: Diagnosis not present

## 2013-08-27 ENCOUNTER — Ambulatory Visit: Payer: Medicare Other

## 2013-08-27 DIAGNOSIS — N186 End stage renal disease: Secondary | ICD-10-CM | POA: Diagnosis not present

## 2013-08-27 DIAGNOSIS — K759 Inflammatory liver disease, unspecified: Secondary | ICD-10-CM | POA: Diagnosis not present

## 2013-08-28 DIAGNOSIS — K759 Inflammatory liver disease, unspecified: Secondary | ICD-10-CM | POA: Diagnosis not present

## 2013-08-28 DIAGNOSIS — N186 End stage renal disease: Secondary | ICD-10-CM | POA: Diagnosis not present

## 2013-08-29 DIAGNOSIS — N186 End stage renal disease: Secondary | ICD-10-CM | POA: Diagnosis not present

## 2013-08-29 DIAGNOSIS — K759 Inflammatory liver disease, unspecified: Secondary | ICD-10-CM | POA: Diagnosis not present

## 2013-08-30 DIAGNOSIS — K759 Inflammatory liver disease, unspecified: Secondary | ICD-10-CM | POA: Diagnosis not present

## 2013-08-30 DIAGNOSIS — N186 End stage renal disease: Secondary | ICD-10-CM | POA: Diagnosis not present

## 2013-08-31 DIAGNOSIS — K759 Inflammatory liver disease, unspecified: Secondary | ICD-10-CM | POA: Diagnosis not present

## 2013-08-31 DIAGNOSIS — N186 End stage renal disease: Secondary | ICD-10-CM | POA: Diagnosis not present

## 2013-09-01 DIAGNOSIS — N186 End stage renal disease: Secondary | ICD-10-CM | POA: Diagnosis not present

## 2013-09-01 DIAGNOSIS — K759 Inflammatory liver disease, unspecified: Secondary | ICD-10-CM | POA: Diagnosis not present

## 2013-09-02 DIAGNOSIS — N186 End stage renal disease: Secondary | ICD-10-CM | POA: Diagnosis not present

## 2013-09-02 DIAGNOSIS — K759 Inflammatory liver disease, unspecified: Secondary | ICD-10-CM | POA: Diagnosis not present

## 2013-09-03 DIAGNOSIS — N186 End stage renal disease: Secondary | ICD-10-CM | POA: Diagnosis not present

## 2013-09-03 DIAGNOSIS — K759 Inflammatory liver disease, unspecified: Secondary | ICD-10-CM | POA: Diagnosis not present

## 2013-09-04 ENCOUNTER — Ambulatory Visit: Payer: Medicare Other

## 2013-09-04 DIAGNOSIS — K759 Inflammatory liver disease, unspecified: Secondary | ICD-10-CM | POA: Diagnosis not present

## 2013-09-04 DIAGNOSIS — N186 End stage renal disease: Secondary | ICD-10-CM | POA: Diagnosis not present

## 2013-09-05 DIAGNOSIS — K759 Inflammatory liver disease, unspecified: Secondary | ICD-10-CM | POA: Diagnosis not present

## 2013-09-05 DIAGNOSIS — N186 End stage renal disease: Secondary | ICD-10-CM | POA: Diagnosis not present

## 2013-09-06 DIAGNOSIS — N186 End stage renal disease: Secondary | ICD-10-CM | POA: Diagnosis not present

## 2013-09-06 DIAGNOSIS — K759 Inflammatory liver disease, unspecified: Secondary | ICD-10-CM | POA: Diagnosis not present

## 2013-09-07 DIAGNOSIS — K838 Other specified diseases of biliary tract: Secondary | ICD-10-CM | POA: Diagnosis not present

## 2013-09-07 DIAGNOSIS — E782 Mixed hyperlipidemia: Secondary | ICD-10-CM | POA: Diagnosis not present

## 2013-09-07 DIAGNOSIS — R188 Other ascites: Secondary | ICD-10-CM | POA: Diagnosis not present

## 2013-09-07 DIAGNOSIS — N281 Cyst of kidney, acquired: Secondary | ICD-10-CM | POA: Diagnosis not present

## 2013-09-07 DIAGNOSIS — N186 End stage renal disease: Secondary | ICD-10-CM | POA: Diagnosis not present

## 2013-09-07 DIAGNOSIS — K869 Disease of pancreas, unspecified: Secondary | ICD-10-CM | POA: Diagnosis not present

## 2013-09-08 DIAGNOSIS — E782 Mixed hyperlipidemia: Secondary | ICD-10-CM | POA: Diagnosis not present

## 2013-09-08 DIAGNOSIS — N186 End stage renal disease: Secondary | ICD-10-CM | POA: Diagnosis not present

## 2013-09-09 DIAGNOSIS — N186 End stage renal disease: Secondary | ICD-10-CM | POA: Diagnosis not present

## 2013-09-09 DIAGNOSIS — E782 Mixed hyperlipidemia: Secondary | ICD-10-CM | POA: Diagnosis not present

## 2013-09-10 DIAGNOSIS — N186 End stage renal disease: Secondary | ICD-10-CM | POA: Diagnosis not present

## 2013-09-10 DIAGNOSIS — E782 Mixed hyperlipidemia: Secondary | ICD-10-CM | POA: Diagnosis not present

## 2013-09-11 DIAGNOSIS — E782 Mixed hyperlipidemia: Secondary | ICD-10-CM | POA: Diagnosis not present

## 2013-09-11 DIAGNOSIS — N186 End stage renal disease: Secondary | ICD-10-CM | POA: Diagnosis not present

## 2013-09-12 DIAGNOSIS — N186 End stage renal disease: Secondary | ICD-10-CM | POA: Diagnosis not present

## 2013-09-12 DIAGNOSIS — E782 Mixed hyperlipidemia: Secondary | ICD-10-CM | POA: Diagnosis not present

## 2013-09-13 DIAGNOSIS — N186 End stage renal disease: Secondary | ICD-10-CM | POA: Diagnosis not present

## 2013-09-13 DIAGNOSIS — E782 Mixed hyperlipidemia: Secondary | ICD-10-CM | POA: Diagnosis not present

## 2013-09-14 DIAGNOSIS — N186 End stage renal disease: Secondary | ICD-10-CM | POA: Diagnosis not present

## 2013-09-14 DIAGNOSIS — E782 Mixed hyperlipidemia: Secondary | ICD-10-CM | POA: Diagnosis not present

## 2013-09-15 DIAGNOSIS — E782 Mixed hyperlipidemia: Secondary | ICD-10-CM | POA: Diagnosis not present

## 2013-09-15 DIAGNOSIS — N186 End stage renal disease: Secondary | ICD-10-CM | POA: Diagnosis not present

## 2013-09-16 DIAGNOSIS — E782 Mixed hyperlipidemia: Secondary | ICD-10-CM | POA: Diagnosis not present

## 2013-09-16 DIAGNOSIS — N186 End stage renal disease: Secondary | ICD-10-CM | POA: Diagnosis not present

## 2013-09-17 DIAGNOSIS — E782 Mixed hyperlipidemia: Secondary | ICD-10-CM | POA: Diagnosis not present

## 2013-09-17 DIAGNOSIS — N186 End stage renal disease: Secondary | ICD-10-CM | POA: Diagnosis not present

## 2013-09-18 ENCOUNTER — Ambulatory Visit (INDEPENDENT_AMBULATORY_CARE_PROVIDER_SITE_OTHER): Payer: Medicare Other

## 2013-09-18 VITALS — BP 146/78 | HR 90 | Resp 18

## 2013-09-18 DIAGNOSIS — L608 Other nail disorders: Secondary | ICD-10-CM | POA: Diagnosis not present

## 2013-09-18 DIAGNOSIS — E1142 Type 2 diabetes mellitus with diabetic polyneuropathy: Secondary | ICD-10-CM

## 2013-09-18 DIAGNOSIS — E118 Type 2 diabetes mellitus with unspecified complications: Secondary | ICD-10-CM

## 2013-09-18 DIAGNOSIS — N186 End stage renal disease: Secondary | ICD-10-CM | POA: Diagnosis not present

## 2013-09-18 DIAGNOSIS — E1149 Type 2 diabetes mellitus with other diabetic neurological complication: Secondary | ICD-10-CM | POA: Diagnosis not present

## 2013-09-18 DIAGNOSIS — E782 Mixed hyperlipidemia: Secondary | ICD-10-CM | POA: Diagnosis not present

## 2013-09-18 DIAGNOSIS — E1140 Type 2 diabetes mellitus with diabetic neuropathy, unspecified: Secondary | ICD-10-CM

## 2013-09-18 NOTE — Patient Instructions (Signed)
Diabetes and Foot Care Diabetes may cause you to have problems because of poor blood supply (circulation) to your feet and legs. This may cause the skin on your feet to become thinner, break easier, and heal more slowly. Your skin may become dry, and the skin may peel and crack. You may also have nerve damage in your legs and feet causing decreased feeling in them. You may not notice minor injuries to your feet that could lead to infections or more serious problems. Taking care of your feet is one of the most important things you can do for yourself.  HOME CARE INSTRUCTIONS  Wear shoes at all times, even in the house. Do not go barefoot. Bare feet are easily injured.  Check your feet daily for blisters, cuts, and redness. If you cannot see the bottom of your feet, use a mirror or ask someone for help.  Wash your feet with warm water (do not use hot water) and mild soap. Then pat your feet and the areas between your toes until they are completely dry. Do not soak your feet as this can dry your skin.  Apply a moisturizing lotion or petroleum jelly (that does not contain alcohol and is unscented) to the skin on your feet and to dry, brittle toenails. Do not apply lotion between your toes.  Trim your toenails straight across. Do not dig under them or around the cuticle. File the edges of your nails with an emery board or nail file.  Do not cut corns or calluses or try to remove them with medicine.  Wear clean socks or stockings every day. Make sure they are not too tight. Do not wear knee-high stockings since they may decrease blood flow to your legs.  Wear shoes that fit properly and have enough cushioning. To break in new shoes, wear them for just a few hours a day. This prevents you from injuring your feet. Always look in your shoes before you put them on to be sure there are no objects inside.  Do not cross your legs. This may decrease the blood flow to your feet.  If you find a minor scrape,  cut, or break in the skin on your feet, keep it and the skin around it clean and dry. These areas may be cleansed with mild soap and water. Do not cleanse the area with peroxide, alcohol, or iodine.  When you remove an adhesive bandage, be sure not to damage the skin around it.  If you have a wound, look at it several times a day to make sure it is healing.  Do not use heating pads or hot water bottles. They may burn your skin. If you have lost feeling in your feet or legs, you may not know it is happening until it is too late.  Make sure your health care provider performs a complete foot exam at least annually or more often if you have foot problems. Report any cuts, sores, or bruises to your health care provider immediately. SEEK MEDICAL CARE IF:   You have an injury that is not healing.  You have cuts or breaks in the skin.  You have an ingrown nail.  You notice redness on your legs or feet.  You feel burning or tingling in your legs or feet.  You have pain or cramps in your legs and feet.  Your legs or feet are numb.  Your feet always feel cold. SEEK IMMEDIATE MEDICAL CARE IF:   There is increasing redness,   swelling, or pain in or around a wound.  There is a red line that goes up your leg.  Pus is coming from a wound.  You develop a fever or as directed by your health care provider.  You notice a bad smell coming from an ulcer or wound. Document Released: 06/23/2000 Document Revised: 02/26/2013 Document Reviewed: 12/03/2012 Exit[employer] Patient Information 2014 [employer].  

## 2013-09-18 NOTE — Progress Notes (Signed)
   Subjective:    Patient ID: Linda Jordan, female    DOB: April 05, 1930, 80 y.o.   MRN: [id number]  HPI I am here to get my 3 month trim of my toenails    Review of Systems no new changes or findings    Objective:   Physical Exam Masker status is intact pedal pulses DP +2/4 PT thready one over 4 bilateral Refill time 3 seconds all digits. Epicritic and proprioceptive sensations diminished on [employer] testing. There is normal plantar response DTRs not elicited dermatologically skin color pigment normal hair growth absent nails thick criptotic discolored incurvated in particular hallux bilateral showed distal dystrophy she been using Fungi-Nail the past however has not been consistent suggested she resume using Fungi-Nail once or twice daily for a six-month duration. Patient has no open wounds ulcerations no secondary infections rectus foot type mild flexible digital contractures are noted. Patient's diabetes is been stable however still having some difficulties with her dialysis       Assessment & Plan:  Assessment diabetes with peripheral neuropathy dystrophic friable gratified nails debrided 1 through 5 bilateral return for future followup care and as-needed basis suggest 3 month followup contact me change difficulties in the interim. Suggested continue topical Fungi-Nail for at least a six-month duration was twice daily  Harriet Masson DPM

## 2013-09-19 DIAGNOSIS — N186 End stage renal disease: Secondary | ICD-10-CM | POA: Diagnosis not present

## 2013-09-19 DIAGNOSIS — E782 Mixed hyperlipidemia: Secondary | ICD-10-CM | POA: Diagnosis not present

## 2013-09-20 DIAGNOSIS — N186 End stage renal disease: Secondary | ICD-10-CM | POA: Diagnosis not present

## 2013-09-20 DIAGNOSIS — E782 Mixed hyperlipidemia: Secondary | ICD-10-CM | POA: Diagnosis not present

## 2013-09-21 DIAGNOSIS — N186 End stage renal disease: Secondary | ICD-10-CM | POA: Diagnosis not present

## 2013-09-21 DIAGNOSIS — E782 Mixed hyperlipidemia: Secondary | ICD-10-CM | POA: Diagnosis not present

## 2013-09-22 DIAGNOSIS — E782 Mixed hyperlipidemia: Secondary | ICD-10-CM | POA: Diagnosis not present

## 2013-09-22 DIAGNOSIS — N186 End stage renal disease: Secondary | ICD-10-CM | POA: Diagnosis not present

## 2013-09-23 DIAGNOSIS — E782 Mixed hyperlipidemia: Secondary | ICD-10-CM | POA: Diagnosis not present

## 2013-09-23 DIAGNOSIS — H26499 Other secondary cataract, unspecified eye: Secondary | ICD-10-CM | POA: Diagnosis not present

## 2013-09-23 DIAGNOSIS — N186 End stage renal disease: Secondary | ICD-10-CM | POA: Diagnosis not present

## 2013-09-24 DIAGNOSIS — E782 Mixed hyperlipidemia: Secondary | ICD-10-CM | POA: Diagnosis not present

## 2013-09-24 DIAGNOSIS — N186 End stage renal disease: Secondary | ICD-10-CM | POA: Diagnosis not present

## 2013-09-25 DIAGNOSIS — N186 End stage renal disease: Secondary | ICD-10-CM | POA: Diagnosis not present

## 2013-09-25 DIAGNOSIS — E782 Mixed hyperlipidemia: Secondary | ICD-10-CM | POA: Diagnosis not present

## 2013-09-26 DIAGNOSIS — N186 End stage renal disease: Secondary | ICD-10-CM | POA: Diagnosis not present

## 2013-09-26 DIAGNOSIS — E782 Mixed hyperlipidemia: Secondary | ICD-10-CM | POA: Diagnosis not present

## 2013-09-27 DIAGNOSIS — E782 Mixed hyperlipidemia: Secondary | ICD-10-CM | POA: Diagnosis not present

## 2013-09-27 DIAGNOSIS — N186 End stage renal disease: Secondary | ICD-10-CM | POA: Diagnosis not present

## 2013-09-28 DIAGNOSIS — N186 End stage renal disease: Secondary | ICD-10-CM | POA: Diagnosis not present

## 2013-09-28 DIAGNOSIS — E782 Mixed hyperlipidemia: Secondary | ICD-10-CM | POA: Diagnosis not present

## 2013-09-29 DIAGNOSIS — E782 Mixed hyperlipidemia: Secondary | ICD-10-CM | POA: Diagnosis not present

## 2013-09-29 DIAGNOSIS — N186 End stage renal disease: Secondary | ICD-10-CM | POA: Diagnosis not present

## 2013-09-30 DIAGNOSIS — E782 Mixed hyperlipidemia: Secondary | ICD-10-CM | POA: Diagnosis not present

## 2013-09-30 DIAGNOSIS — N186 End stage renal disease: Secondary | ICD-10-CM | POA: Diagnosis not present

## 2013-10-01 DIAGNOSIS — N186 End stage renal disease: Secondary | ICD-10-CM | POA: Diagnosis not present

## 2013-10-01 DIAGNOSIS — E782 Mixed hyperlipidemia: Secondary | ICD-10-CM | POA: Diagnosis not present

## 2013-10-02 DIAGNOSIS — E039 Hypothyroidism, unspecified: Secondary | ICD-10-CM | POA: Diagnosis not present

## 2013-10-02 DIAGNOSIS — N186 End stage renal disease: Secondary | ICD-10-CM | POA: Diagnosis not present

## 2013-10-02 DIAGNOSIS — E782 Mixed hyperlipidemia: Secondary | ICD-10-CM | POA: Diagnosis not present

## 2013-10-02 DIAGNOSIS — E785 Hyperlipidemia, unspecified: Secondary | ICD-10-CM | POA: Diagnosis not present

## 2013-10-02 DIAGNOSIS — J309 Allergic rhinitis, unspecified: Secondary | ICD-10-CM | POA: Diagnosis not present

## 2013-10-02 DIAGNOSIS — Z6825 Body mass index (BMI) 25.0-25.9, adult: Secondary | ICD-10-CM | POA: Diagnosis not present

## 2013-10-02 DIAGNOSIS — E1129 Type 2 diabetes mellitus with other diabetic kidney complication: Secondary | ICD-10-CM | POA: Diagnosis not present

## 2013-10-03 DIAGNOSIS — N186 End stage renal disease: Secondary | ICD-10-CM | POA: Diagnosis not present

## 2013-10-03 DIAGNOSIS — E782 Mixed hyperlipidemia: Secondary | ICD-10-CM | POA: Diagnosis not present

## 2013-10-04 DIAGNOSIS — N186 End stage renal disease: Secondary | ICD-10-CM | POA: Diagnosis not present

## 2013-10-04 DIAGNOSIS — E782 Mixed hyperlipidemia: Secondary | ICD-10-CM | POA: Diagnosis not present

## 2013-10-05 DIAGNOSIS — E782 Mixed hyperlipidemia: Secondary | ICD-10-CM | POA: Diagnosis not present

## 2013-10-05 DIAGNOSIS — N186 End stage renal disease: Secondary | ICD-10-CM | POA: Diagnosis not present

## 2013-10-06 DIAGNOSIS — E782 Mixed hyperlipidemia: Secondary | ICD-10-CM | POA: Diagnosis not present

## 2013-10-06 DIAGNOSIS — N186 End stage renal disease: Secondary | ICD-10-CM | POA: Diagnosis not present

## 2013-10-07 DIAGNOSIS — E782 Mixed hyperlipidemia: Secondary | ICD-10-CM | POA: Diagnosis not present

## 2013-10-07 DIAGNOSIS — N186 End stage renal disease: Secondary | ICD-10-CM | POA: Diagnosis not present

## 2013-10-08 DIAGNOSIS — N186 End stage renal disease: Secondary | ICD-10-CM | POA: Diagnosis not present

## 2013-10-09 DIAGNOSIS — N186 End stage renal disease: Secondary | ICD-10-CM | POA: Diagnosis not present

## 2013-10-10 DIAGNOSIS — N186 End stage renal disease: Secondary | ICD-10-CM | POA: Diagnosis not present

## 2013-10-11 DIAGNOSIS — N186 End stage renal disease: Secondary | ICD-10-CM | POA: Diagnosis not present

## 2013-10-12 DIAGNOSIS — N186 End stage renal disease: Secondary | ICD-10-CM | POA: Diagnosis not present

## 2013-10-13 DIAGNOSIS — N186 End stage renal disease: Secondary | ICD-10-CM | POA: Diagnosis not present

## 2013-10-14 DIAGNOSIS — N186 End stage renal disease: Secondary | ICD-10-CM | POA: Diagnosis not present

## 2013-10-15 DIAGNOSIS — N186 End stage renal disease: Secondary | ICD-10-CM | POA: Diagnosis not present

## 2013-10-16 DIAGNOSIS — N186 End stage renal disease: Secondary | ICD-10-CM | POA: Diagnosis not present

## 2013-10-17 DIAGNOSIS — N186 End stage renal disease: Secondary | ICD-10-CM | POA: Diagnosis not present

## 2013-10-18 DIAGNOSIS — N186 End stage renal disease: Secondary | ICD-10-CM | POA: Diagnosis not present

## 2013-10-19 DIAGNOSIS — N186 End stage renal disease: Secondary | ICD-10-CM | POA: Diagnosis not present

## 2013-10-20 DIAGNOSIS — N186 End stage renal disease: Secondary | ICD-10-CM | POA: Diagnosis not present

## 2013-10-21 DIAGNOSIS — N186 End stage renal disease: Secondary | ICD-10-CM | POA: Diagnosis not present

## 2013-10-22 DIAGNOSIS — N186 End stage renal disease: Secondary | ICD-10-CM | POA: Diagnosis not present

## 2013-10-23 DIAGNOSIS — N186 End stage renal disease: Secondary | ICD-10-CM | POA: Diagnosis not present

## 2013-10-24 DIAGNOSIS — N186 End stage renal disease: Secondary | ICD-10-CM | POA: Diagnosis not present

## 2013-10-25 DIAGNOSIS — N186 End stage renal disease: Secondary | ICD-10-CM | POA: Diagnosis not present

## 2013-10-26 DIAGNOSIS — N186 End stage renal disease: Secondary | ICD-10-CM | POA: Diagnosis not present

## 2013-10-27 DIAGNOSIS — N186 End stage renal disease: Secondary | ICD-10-CM | POA: Diagnosis not present

## 2013-10-28 DIAGNOSIS — N186 End stage renal disease: Secondary | ICD-10-CM | POA: Diagnosis not present

## 2013-10-29 DIAGNOSIS — N186 End stage renal disease: Secondary | ICD-10-CM | POA: Diagnosis not present

## 2013-10-30 DIAGNOSIS — N186 End stage renal disease: Secondary | ICD-10-CM | POA: Diagnosis not present

## 2013-10-31 DIAGNOSIS — N186 End stage renal disease: Secondary | ICD-10-CM | POA: Diagnosis not present

## 2013-11-01 DIAGNOSIS — N186 End stage renal disease: Secondary | ICD-10-CM | POA: Diagnosis not present

## 2013-11-02 DIAGNOSIS — N186 End stage renal disease: Secondary | ICD-10-CM | POA: Diagnosis not present

## 2013-11-03 DIAGNOSIS — N186 End stage renal disease: Secondary | ICD-10-CM | POA: Diagnosis not present

## 2013-11-04 DIAGNOSIS — N186 End stage renal disease: Secondary | ICD-10-CM | POA: Diagnosis not present

## 2013-11-05 DIAGNOSIS — N186 End stage renal disease: Secondary | ICD-10-CM | POA: Diagnosis not present

## 2013-11-06 DIAGNOSIS — N186 End stage renal disease: Secondary | ICD-10-CM | POA: Diagnosis not present

## 2013-11-07 DIAGNOSIS — N186 End stage renal disease: Secondary | ICD-10-CM | POA: Diagnosis not present

## 2013-11-08 DIAGNOSIS — N186 End stage renal disease: Secondary | ICD-10-CM | POA: Diagnosis not present

## 2013-11-09 DIAGNOSIS — N186 End stage renal disease: Secondary | ICD-10-CM | POA: Diagnosis not present

## 2013-11-10 DIAGNOSIS — N186 End stage renal disease: Secondary | ICD-10-CM | POA: Diagnosis not present

## 2013-11-11 DIAGNOSIS — N186 End stage renal disease: Secondary | ICD-10-CM | POA: Diagnosis not present

## 2013-11-12 DIAGNOSIS — N186 End stage renal disease: Secondary | ICD-10-CM | POA: Diagnosis not present

## 2013-11-13 DIAGNOSIS — N186 End stage renal disease: Secondary | ICD-10-CM | POA: Diagnosis not present

## 2013-11-14 DIAGNOSIS — N186 End stage renal disease: Secondary | ICD-10-CM | POA: Diagnosis not present

## 2013-11-15 DIAGNOSIS — N186 End stage renal disease: Secondary | ICD-10-CM | POA: Diagnosis not present

## 2013-11-16 DIAGNOSIS — N186 End stage renal disease: Secondary | ICD-10-CM | POA: Diagnosis not present

## 2013-11-17 DIAGNOSIS — N186 End stage renal disease: Secondary | ICD-10-CM | POA: Diagnosis not present

## 2013-11-18 DIAGNOSIS — N186 End stage renal disease: Secondary | ICD-10-CM | POA: Diagnosis not present

## 2013-11-19 DIAGNOSIS — N186 End stage renal disease: Secondary | ICD-10-CM | POA: Diagnosis not present

## 2013-11-20 DIAGNOSIS — N186 End stage renal disease: Secondary | ICD-10-CM | POA: Diagnosis not present

## 2013-11-21 DIAGNOSIS — N186 End stage renal disease: Secondary | ICD-10-CM | POA: Diagnosis not present

## 2013-11-22 DIAGNOSIS — N186 End stage renal disease: Secondary | ICD-10-CM | POA: Diagnosis not present

## 2013-11-23 DIAGNOSIS — N186 End stage renal disease: Secondary | ICD-10-CM | POA: Diagnosis not present

## 2013-11-24 DIAGNOSIS — N186 End stage renal disease: Secondary | ICD-10-CM | POA: Diagnosis not present

## 2013-11-25 DIAGNOSIS — N186 End stage renal disease: Secondary | ICD-10-CM | POA: Diagnosis not present

## 2013-11-26 DIAGNOSIS — N186 End stage renal disease: Secondary | ICD-10-CM | POA: Diagnosis not present

## 2013-11-27 DIAGNOSIS — N186 End stage renal disease: Secondary | ICD-10-CM | POA: Diagnosis not present

## 2013-11-28 DIAGNOSIS — N186 End stage renal disease: Secondary | ICD-10-CM | POA: Diagnosis not present

## 2013-11-29 DIAGNOSIS — N186 End stage renal disease: Secondary | ICD-10-CM | POA: Diagnosis not present

## 2013-11-30 DIAGNOSIS — N186 End stage renal disease: Secondary | ICD-10-CM | POA: Diagnosis not present

## 2013-12-01 DIAGNOSIS — N186 End stage renal disease: Secondary | ICD-10-CM | POA: Diagnosis not present

## 2013-12-02 DIAGNOSIS — N186 End stage renal disease: Secondary | ICD-10-CM | POA: Diagnosis not present

## 2013-12-03 DIAGNOSIS — N186 End stage renal disease: Secondary | ICD-10-CM | POA: Diagnosis not present

## 2013-12-04 DIAGNOSIS — N186 End stage renal disease: Secondary | ICD-10-CM | POA: Diagnosis not present

## 2013-12-05 DIAGNOSIS — N186 End stage renal disease: Secondary | ICD-10-CM | POA: Diagnosis not present

## 2013-12-06 DIAGNOSIS — N186 End stage renal disease: Secondary | ICD-10-CM | POA: Diagnosis not present

## 2013-12-07 DIAGNOSIS — N186 End stage renal disease: Secondary | ICD-10-CM | POA: Diagnosis not present

## 2013-12-08 DIAGNOSIS — N186 End stage renal disease: Secondary | ICD-10-CM | POA: Diagnosis not present

## 2013-12-09 DIAGNOSIS — J449 Chronic obstructive pulmonary disease, unspecified: Secondary | ICD-10-CM | POA: Diagnosis not present

## 2013-12-09 DIAGNOSIS — N186 End stage renal disease: Secondary | ICD-10-CM | POA: Diagnosis not present

## 2013-12-09 DIAGNOSIS — I10 Essential (primary) hypertension: Secondary | ICD-10-CM | POA: Diagnosis not present

## 2013-12-09 DIAGNOSIS — E039 Hypothyroidism, unspecified: Secondary | ICD-10-CM | POA: Diagnosis not present

## 2013-12-09 DIAGNOSIS — E119 Type 2 diabetes mellitus without complications: Secondary | ICD-10-CM | POA: Diagnosis not present

## 2013-12-09 DIAGNOSIS — K219 Gastro-esophageal reflux disease without esophagitis: Secondary | ICD-10-CM | POA: Diagnosis not present

## 2013-12-10 DIAGNOSIS — N186 End stage renal disease: Secondary | ICD-10-CM | POA: Diagnosis not present

## 2013-12-11 DIAGNOSIS — N186 End stage renal disease: Secondary | ICD-10-CM | POA: Diagnosis not present

## 2013-12-11 DIAGNOSIS — R0602 Shortness of breath: Secondary | ICD-10-CM | POA: Diagnosis not present

## 2013-12-12 DIAGNOSIS — N186 End stage renal disease: Secondary | ICD-10-CM | POA: Diagnosis not present

## 2013-12-13 DIAGNOSIS — N186 End stage renal disease: Secondary | ICD-10-CM | POA: Diagnosis not present

## 2013-12-14 DIAGNOSIS — N186 End stage renal disease: Secondary | ICD-10-CM | POA: Diagnosis not present

## 2013-12-15 DIAGNOSIS — N186 End stage renal disease: Secondary | ICD-10-CM | POA: Diagnosis not present

## 2013-12-16 DIAGNOSIS — N186 End stage renal disease: Secondary | ICD-10-CM | POA: Diagnosis not present

## 2013-12-17 DIAGNOSIS — N186 End stage renal disease: Secondary | ICD-10-CM | POA: Diagnosis not present

## 2013-12-18 ENCOUNTER — Ambulatory Visit (INDEPENDENT_AMBULATORY_CARE_PROVIDER_SITE_OTHER): Payer: Medicare Other

## 2013-12-18 VITALS — BP 148/66 | HR 59 | Resp 18

## 2013-12-18 DIAGNOSIS — L608 Other nail disorders: Secondary | ICD-10-CM | POA: Diagnosis not present

## 2013-12-18 DIAGNOSIS — E118 Type 2 diabetes mellitus with unspecified complications: Secondary | ICD-10-CM

## 2013-12-18 DIAGNOSIS — N19 Unspecified kidney failure: Secondary | ICD-10-CM

## 2013-12-18 DIAGNOSIS — N186 End stage renal disease: Secondary | ICD-10-CM | POA: Diagnosis not present

## 2013-12-18 NOTE — Patient Instructions (Signed)
Diabetes and Foot Care Diabetes may cause you to have problems because of poor blood supply (circulation) to your feet and legs. This may cause the skin on your feet to become thinner, break easier, and heal more slowly. Your skin may become dry, and the skin may peel and crack. You may also have nerve damage in your legs and feet causing decreased feeling in them. You may not notice minor injuries to your feet that could lead to infections or more serious problems. Taking care of your feet is one of the most important things you can do for yourself.  HOME CARE INSTRUCTIONS  Wear shoes at all times, even in the house. Do not go barefoot. Bare feet are easily injured.  Check your feet daily for blisters, cuts, and redness. If you cannot see the bottom of your feet, use a mirror or ask someone for help.  Wash your feet with warm water (do not use hot water) and mild soap. Then pat your feet and the areas between your toes until they are completely dry. Do not soak your feet as this can dry your skin.  Apply a moisturizing lotion or petroleum jelly (that does not contain alcohol and is unscented) to the skin on your feet and to dry, brittle toenails. Do not apply lotion between your toes.  Trim your toenails straight across. Do not dig under them or around the cuticle. File the edges of your nails with an emery board or nail file.  Do not cut corns or calluses or try to remove them with medicine.  Wear clean socks or stockings every day. Make sure they are not too tight. Do not wear knee-high stockings since they may decrease blood flow to your legs.  Wear shoes that fit properly and have enough cushioning. To break in new shoes, wear them for just a few hours a day. This prevents you from injuring your feet. Always look in your shoes before you put them on to be sure there are no objects inside.  Do not cross your legs. This may decrease the blood flow to your feet.  If you find a minor scrape,  cut, or break in the skin on your feet, keep it and the skin around it clean and dry. These areas may be cleansed with mild soap and water. Do not cleanse the area with peroxide, alcohol, or iodine.  When you remove an adhesive bandage, be sure not to damage the skin around it.  If you have a wound, look at it several times a day to make sure it is healing.  Do not use heating pads or hot water bottles. They may burn your skin. If you have lost feeling in your feet or legs, you may not know it is happening until it is too late.  Make sure your health care provider performs a complete foot exam at least annually or more often if you have foot problems. Report any cuts, sores, or bruises to your health care provider immediately. SEEK MEDICAL CARE IF:   You have an injury that is not healing.  You have cuts or breaks in the skin.  You have an ingrown nail.  You notice redness on your legs or feet.  You feel burning or tingling in your legs or feet.  You have pain or cramps in your legs and feet.  Your legs or feet are numb.  Your feet always feel cold. SEEK IMMEDIATE MEDICAL CARE IF:   There is increasing redness,   swelling, or pain in or around a wound.  There is a red line that goes up your leg.  Pus is coming from a wound.  You develop a fever or as directed by your health care provider.  You notice a bad smell coming from an ulcer or wound. Document Released: 06/23/2000 Document Revised: 02/26/2013 Document Reviewed: 12/03/2012 Exit[employer] Patient Information 2014 [employer].  

## 2013-12-18 NOTE — Progress Notes (Signed)
   Subjective:    Patient ID: Glendon Axe, female    DOB: 1929/09/03, 80 y.o.   MRN: [id number]  HPI I AM HERE TO GET MY TOENAILS TRIMMED UP IT HAS BEEN 3 MONTHS SINCE I WAS HERE LAST    Review of Systems new findings or systemic changes noted     Objective:   Physical Exam 80 year old white female process at this time well-developed well-nourished oriented x3 with vital signs stable patient has started in a blood pressure medicine and finish to others Amparo Bristol changes to not have the names medications for you. To schedule followup with her cardiologist and [facility] something next week for evaluation of her heart posse some ischemic heart disease issues.  Lower extremity objective findings unchanged pedal pulses are palpable DP +2/4 PT one over 4 bilateral capillary refill time 3 seconds. Epicritic sensations diminished on Semmes Weinstein to forefoot and digits bilateral there is normal plantar response DTRs not elicited hair growth is absent nails criptotic discolored and incurvated hallux bilateral are the hallux is are showing significant improvement the distalmost portion still has fungus in her approximator showing significant clearing with the continued use a topical nail antifungal. Using Fungi-Nail for a prolonged period time advised to continue to do so at this time orthopedic biomechanical exam unremarkable mild semirigid digital contractures are noted no open wounds ulcerations no signs of infection minimal edema noted in both ankle       Assessment & Plan:  Assessment diabetes with peripheral neuropathy dystrophic from criptotic nails debrided 1 through 5 bilateral return in 3 months for an as-needed basis for future palliative care and diabetic foot nail care and management  Harriet Masson DPM

## 2013-12-19 DIAGNOSIS — N186 End stage renal disease: Secondary | ICD-10-CM | POA: Diagnosis not present

## 2013-12-20 DIAGNOSIS — N186 End stage renal disease: Secondary | ICD-10-CM | POA: Diagnosis not present

## 2013-12-21 DIAGNOSIS — N186 End stage renal disease: Secondary | ICD-10-CM | POA: Diagnosis not present

## 2013-12-22 DIAGNOSIS — N186 End stage renal disease: Secondary | ICD-10-CM | POA: Diagnosis not present

## 2013-12-23 DIAGNOSIS — N186 End stage renal disease: Secondary | ICD-10-CM | POA: Diagnosis not present

## 2013-12-24 DIAGNOSIS — N186 End stage renal disease: Secondary | ICD-10-CM | POA: Diagnosis not present

## 2013-12-25 DIAGNOSIS — N186 End stage renal disease: Secondary | ICD-10-CM | POA: Diagnosis not present

## 2013-12-26 DIAGNOSIS — R0602 Shortness of breath: Secondary | ICD-10-CM | POA: Diagnosis not present

## 2013-12-26 DIAGNOSIS — I2510 Atherosclerotic heart disease of native coronary artery without angina pectoris: Secondary | ICD-10-CM | POA: Diagnosis not present

## 2013-12-26 DIAGNOSIS — N186 End stage renal disease: Secondary | ICD-10-CM | POA: Diagnosis not present

## 2013-12-26 DIAGNOSIS — R079 Chest pain, unspecified: Secondary | ICD-10-CM | POA: Diagnosis not present

## 2013-12-26 DIAGNOSIS — R0789 Other chest pain: Secondary | ICD-10-CM | POA: Diagnosis not present

## 2013-12-26 DIAGNOSIS — I129 Hypertensive chronic kidney disease with stage 1 through stage 4 chronic kidney disease, or unspecified chronic kidney disease: Secondary | ICD-10-CM | POA: Diagnosis not present

## 2013-12-26 DIAGNOSIS — I517 Cardiomegaly: Secondary | ICD-10-CM | POA: Diagnosis not present

## 2013-12-26 DIAGNOSIS — I059 Rheumatic mitral valve disease, unspecified: Secondary | ICD-10-CM | POA: Diagnosis not present

## 2013-12-26 DIAGNOSIS — I080 Rheumatic disorders of both mitral and aortic valves: Secondary | ICD-10-CM | POA: Diagnosis not present

## 2013-12-26 DIAGNOSIS — I700 Atherosclerosis of aorta: Secondary | ICD-10-CM | POA: Diagnosis not present

## 2013-12-26 DIAGNOSIS — I519 Heart disease, unspecified: Secondary | ICD-10-CM | POA: Diagnosis not present

## 2013-12-27 DIAGNOSIS — N186 End stage renal disease: Secondary | ICD-10-CM | POA: Diagnosis not present

## 2013-12-28 DIAGNOSIS — N186 End stage renal disease: Secondary | ICD-10-CM | POA: Diagnosis not present

## 2013-12-29 DIAGNOSIS — N186 End stage renal disease: Secondary | ICD-10-CM | POA: Diagnosis not present

## 2013-12-30 DIAGNOSIS — N186 End stage renal disease: Secondary | ICD-10-CM | POA: Diagnosis not present

## 2013-12-31 DIAGNOSIS — N186 End stage renal disease: Secondary | ICD-10-CM | POA: Diagnosis not present

## 2014-01-01 DIAGNOSIS — N186 End stage renal disease: Secondary | ICD-10-CM | POA: Diagnosis not present

## 2014-01-02 DIAGNOSIS — N186 End stage renal disease: Secondary | ICD-10-CM | POA: Diagnosis not present

## 2014-01-03 DIAGNOSIS — N186 End stage renal disease: Secondary | ICD-10-CM | POA: Diagnosis not present

## 2014-01-04 DIAGNOSIS — N186 End stage renal disease: Secondary | ICD-10-CM | POA: Diagnosis not present

## 2014-01-05 DIAGNOSIS — N186 End stage renal disease: Secondary | ICD-10-CM | POA: Diagnosis not present

## 2014-01-06 DIAGNOSIS — N186 End stage renal disease: Secondary | ICD-10-CM | POA: Diagnosis not present

## 2014-01-07 DIAGNOSIS — N186 End stage renal disease: Secondary | ICD-10-CM | POA: Diagnosis not present

## 2014-01-08 DIAGNOSIS — N186 End stage renal disease: Secondary | ICD-10-CM | POA: Diagnosis not present

## 2014-01-09 DIAGNOSIS — N186 End stage renal disease: Secondary | ICD-10-CM | POA: Diagnosis not present

## 2014-01-10 DIAGNOSIS — N186 End stage renal disease: Secondary | ICD-10-CM | POA: Diagnosis not present

## 2014-01-11 DIAGNOSIS — N186 End stage renal disease: Secondary | ICD-10-CM | POA: Diagnosis not present

## 2014-01-12 DIAGNOSIS — N186 End stage renal disease: Secondary | ICD-10-CM | POA: Diagnosis not present

## 2014-01-13 DIAGNOSIS — N186 End stage renal disease: Secondary | ICD-10-CM | POA: Diagnosis not present

## 2014-01-14 DIAGNOSIS — N186 End stage renal disease: Secondary | ICD-10-CM | POA: Diagnosis not present

## 2014-01-15 DIAGNOSIS — N186 End stage renal disease: Secondary | ICD-10-CM | POA: Diagnosis not present

## 2014-01-16 DIAGNOSIS — N186 End stage renal disease: Secondary | ICD-10-CM | POA: Diagnosis not present

## 2014-01-17 DIAGNOSIS — N186 End stage renal disease: Secondary | ICD-10-CM | POA: Diagnosis not present

## 2014-01-18 DIAGNOSIS — N186 End stage renal disease: Secondary | ICD-10-CM | POA: Diagnosis not present

## 2014-01-19 DIAGNOSIS — N186 End stage renal disease: Secondary | ICD-10-CM | POA: Diagnosis not present

## 2014-01-20 DIAGNOSIS — N186 End stage renal disease: Secondary | ICD-10-CM | POA: Diagnosis not present

## 2014-01-21 DIAGNOSIS — N186 End stage renal disease: Secondary | ICD-10-CM | POA: Diagnosis not present

## 2014-01-22 DIAGNOSIS — N186 End stage renal disease: Secondary | ICD-10-CM | POA: Diagnosis not present

## 2014-01-23 DIAGNOSIS — N186 End stage renal disease: Secondary | ICD-10-CM | POA: Diagnosis not present

## 2014-01-24 DIAGNOSIS — N186 End stage renal disease: Secondary | ICD-10-CM | POA: Diagnosis not present

## 2014-01-25 DIAGNOSIS — N186 End stage renal disease: Secondary | ICD-10-CM | POA: Diagnosis not present

## 2014-01-26 DIAGNOSIS — N186 End stage renal disease: Secondary | ICD-10-CM | POA: Diagnosis not present

## 2014-01-27 DIAGNOSIS — N186 End stage renal disease: Secondary | ICD-10-CM | POA: Diagnosis not present

## 2014-01-28 DIAGNOSIS — N186 End stage renal disease: Secondary | ICD-10-CM | POA: Diagnosis not present

## 2014-01-29 DIAGNOSIS — N186 End stage renal disease: Secondary | ICD-10-CM | POA: Diagnosis not present

## 2014-01-30 DIAGNOSIS — N186 End stage renal disease: Secondary | ICD-10-CM | POA: Diagnosis not present

## 2014-01-31 DIAGNOSIS — N186 End stage renal disease: Secondary | ICD-10-CM | POA: Diagnosis not present

## 2014-02-01 DIAGNOSIS — N186 End stage renal disease: Secondary | ICD-10-CM | POA: Diagnosis not present

## 2014-02-02 DIAGNOSIS — N186 End stage renal disease: Secondary | ICD-10-CM | POA: Diagnosis not present

## 2014-02-03 DIAGNOSIS — N186 End stage renal disease: Secondary | ICD-10-CM | POA: Diagnosis not present

## 2014-02-04 DIAGNOSIS — N186 End stage renal disease: Secondary | ICD-10-CM | POA: Diagnosis not present

## 2014-02-05 DIAGNOSIS — N186 End stage renal disease: Secondary | ICD-10-CM | POA: Diagnosis not present

## 2014-02-06 DIAGNOSIS — N186 End stage renal disease: Secondary | ICD-10-CM | POA: Diagnosis not present

## 2014-02-07 DIAGNOSIS — Z23 Encounter for immunization: Secondary | ICD-10-CM | POA: Diagnosis not present

## 2014-02-07 DIAGNOSIS — N186 End stage renal disease: Secondary | ICD-10-CM | POA: Diagnosis not present

## 2014-02-08 DIAGNOSIS — Z23 Encounter for immunization: Secondary | ICD-10-CM | POA: Diagnosis not present

## 2014-02-08 DIAGNOSIS — N186 End stage renal disease: Secondary | ICD-10-CM | POA: Diagnosis not present

## 2014-02-09 DIAGNOSIS — E039 Hypothyroidism, unspecified: Secondary | ICD-10-CM | POA: Diagnosis not present

## 2014-02-09 DIAGNOSIS — J449 Chronic obstructive pulmonary disease, unspecified: Secondary | ICD-10-CM | POA: Diagnosis not present

## 2014-02-09 DIAGNOSIS — E1129 Type 2 diabetes mellitus with other diabetic kidney complication: Secondary | ICD-10-CM | POA: Diagnosis not present

## 2014-02-09 DIAGNOSIS — E785 Hyperlipidemia, unspecified: Secondary | ICD-10-CM | POA: Diagnosis not present

## 2014-02-09 DIAGNOSIS — Z23 Encounter for immunization: Secondary | ICD-10-CM | POA: Diagnosis not present

## 2014-02-09 DIAGNOSIS — N186 End stage renal disease: Secondary | ICD-10-CM | POA: Diagnosis not present

## 2014-02-10 DIAGNOSIS — N186 End stage renal disease: Secondary | ICD-10-CM | POA: Diagnosis not present

## 2014-02-10 DIAGNOSIS — Z23 Encounter for immunization: Secondary | ICD-10-CM | POA: Diagnosis not present

## 2014-02-11 DIAGNOSIS — Z23 Encounter for immunization: Secondary | ICD-10-CM | POA: Diagnosis not present

## 2014-02-11 DIAGNOSIS — N186 End stage renal disease: Secondary | ICD-10-CM | POA: Diagnosis not present

## 2014-02-12 DIAGNOSIS — N186 End stage renal disease: Secondary | ICD-10-CM | POA: Diagnosis not present

## 2014-02-12 DIAGNOSIS — Z23 Encounter for immunization: Secondary | ICD-10-CM | POA: Diagnosis not present

## 2014-02-13 DIAGNOSIS — N186 End stage renal disease: Secondary | ICD-10-CM | POA: Diagnosis not present

## 2014-02-13 DIAGNOSIS — Z23 Encounter for immunization: Secondary | ICD-10-CM | POA: Diagnosis not present

## 2014-02-14 DIAGNOSIS — N186 End stage renal disease: Secondary | ICD-10-CM | POA: Diagnosis not present

## 2014-02-14 DIAGNOSIS — Z23 Encounter for immunization: Secondary | ICD-10-CM | POA: Diagnosis not present

## 2014-02-15 DIAGNOSIS — Z23 Encounter for immunization: Secondary | ICD-10-CM | POA: Diagnosis not present

## 2014-02-15 DIAGNOSIS — N186 End stage renal disease: Secondary | ICD-10-CM | POA: Diagnosis not present

## 2014-02-16 DIAGNOSIS — N186 End stage renal disease: Secondary | ICD-10-CM | POA: Diagnosis not present

## 2014-02-16 DIAGNOSIS — Z23 Encounter for immunization: Secondary | ICD-10-CM | POA: Diagnosis not present

## 2014-02-17 DIAGNOSIS — N186 End stage renal disease: Secondary | ICD-10-CM | POA: Diagnosis not present

## 2014-02-17 DIAGNOSIS — Z23 Encounter for immunization: Secondary | ICD-10-CM | POA: Diagnosis not present

## 2014-02-18 DIAGNOSIS — Z23 Encounter for immunization: Secondary | ICD-10-CM | POA: Diagnosis not present

## 2014-02-18 DIAGNOSIS — N186 End stage renal disease: Secondary | ICD-10-CM | POA: Diagnosis not present

## 2014-02-19 DIAGNOSIS — Z23 Encounter for immunization: Secondary | ICD-10-CM | POA: Diagnosis not present

## 2014-02-19 DIAGNOSIS — N186 End stage renal disease: Secondary | ICD-10-CM | POA: Diagnosis not present

## 2014-02-20 DIAGNOSIS — N186 End stage renal disease: Secondary | ICD-10-CM | POA: Diagnosis not present

## 2014-02-20 DIAGNOSIS — Z23 Encounter for immunization: Secondary | ICD-10-CM | POA: Diagnosis not present

## 2014-02-21 DIAGNOSIS — Z23 Encounter for immunization: Secondary | ICD-10-CM | POA: Diagnosis not present

## 2014-02-21 DIAGNOSIS — N186 End stage renal disease: Secondary | ICD-10-CM | POA: Diagnosis not present

## 2014-02-22 DIAGNOSIS — N186 End stage renal disease: Secondary | ICD-10-CM | POA: Diagnosis not present

## 2014-02-22 DIAGNOSIS — Z23 Encounter for immunization: Secondary | ICD-10-CM | POA: Diagnosis not present

## 2014-02-23 DIAGNOSIS — N186 End stage renal disease: Secondary | ICD-10-CM | POA: Diagnosis not present

## 2014-02-23 DIAGNOSIS — Z23 Encounter for immunization: Secondary | ICD-10-CM | POA: Diagnosis not present

## 2014-02-24 DIAGNOSIS — Z23 Encounter for immunization: Secondary | ICD-10-CM | POA: Diagnosis not present

## 2014-02-24 DIAGNOSIS — N186 End stage renal disease: Secondary | ICD-10-CM | POA: Diagnosis not present

## 2014-02-25 DIAGNOSIS — N186 End stage renal disease: Secondary | ICD-10-CM | POA: Diagnosis not present

## 2014-02-25 DIAGNOSIS — Z23 Encounter for immunization: Secondary | ICD-10-CM | POA: Diagnosis not present

## 2014-02-26 DIAGNOSIS — Z23 Encounter for immunization: Secondary | ICD-10-CM | POA: Diagnosis not present

## 2014-02-26 DIAGNOSIS — N186 End stage renal disease: Secondary | ICD-10-CM | POA: Diagnosis not present

## 2014-02-27 DIAGNOSIS — N186 End stage renal disease: Secondary | ICD-10-CM | POA: Diagnosis not present

## 2014-02-27 DIAGNOSIS — Z23 Encounter for immunization: Secondary | ICD-10-CM | POA: Diagnosis not present

## 2014-02-28 DIAGNOSIS — Z23 Encounter for immunization: Secondary | ICD-10-CM | POA: Diagnosis not present

## 2014-02-28 DIAGNOSIS — N186 End stage renal disease: Secondary | ICD-10-CM | POA: Diagnosis not present

## 2014-03-01 DIAGNOSIS — N186 End stage renal disease: Secondary | ICD-10-CM | POA: Diagnosis not present

## 2014-03-01 DIAGNOSIS — Z23 Encounter for immunization: Secondary | ICD-10-CM | POA: Diagnosis not present

## 2014-03-02 DIAGNOSIS — N186 End stage renal disease: Secondary | ICD-10-CM | POA: Diagnosis not present

## 2014-03-02 DIAGNOSIS — Z23 Encounter for immunization: Secondary | ICD-10-CM | POA: Diagnosis not present

## 2014-03-03 DIAGNOSIS — Z23 Encounter for immunization: Secondary | ICD-10-CM | POA: Diagnosis not present

## 2014-03-03 DIAGNOSIS — N186 End stage renal disease: Secondary | ICD-10-CM | POA: Diagnosis not present

## 2014-03-04 DIAGNOSIS — Z23 Encounter for immunization: Secondary | ICD-10-CM | POA: Diagnosis not present

## 2014-03-04 DIAGNOSIS — N186 End stage renal disease: Secondary | ICD-10-CM | POA: Diagnosis not present

## 2014-03-05 DIAGNOSIS — Z23 Encounter for immunization: Secondary | ICD-10-CM | POA: Diagnosis not present

## 2014-03-05 DIAGNOSIS — N186 End stage renal disease: Secondary | ICD-10-CM | POA: Diagnosis not present

## 2014-03-06 DIAGNOSIS — Z23 Encounter for immunization: Secondary | ICD-10-CM | POA: Diagnosis not present

## 2014-03-06 DIAGNOSIS — N186 End stage renal disease: Secondary | ICD-10-CM | POA: Diagnosis not present

## 2014-03-07 DIAGNOSIS — N186 End stage renal disease: Secondary | ICD-10-CM | POA: Diagnosis not present

## 2014-03-07 DIAGNOSIS — Z23 Encounter for immunization: Secondary | ICD-10-CM | POA: Diagnosis not present

## 2014-03-08 DIAGNOSIS — N186 End stage renal disease: Secondary | ICD-10-CM | POA: Diagnosis not present

## 2014-03-08 DIAGNOSIS — Z23 Encounter for immunization: Secondary | ICD-10-CM | POA: Diagnosis not present

## 2014-03-09 DIAGNOSIS — N186 End stage renal disease: Secondary | ICD-10-CM | POA: Diagnosis not present

## 2014-03-09 DIAGNOSIS — Z23 Encounter for immunization: Secondary | ICD-10-CM | POA: Diagnosis not present

## 2014-03-10 DIAGNOSIS — Z23 Encounter for immunization: Secondary | ICD-10-CM | POA: Diagnosis not present

## 2014-03-10 DIAGNOSIS — N186 End stage renal disease: Secondary | ICD-10-CM | POA: Diagnosis not present

## 2014-03-11 DIAGNOSIS — N186 End stage renal disease: Secondary | ICD-10-CM | POA: Diagnosis not present

## 2014-03-11 DIAGNOSIS — Z23 Encounter for immunization: Secondary | ICD-10-CM | POA: Diagnosis not present

## 2014-03-12 DIAGNOSIS — Z23 Encounter for immunization: Secondary | ICD-10-CM | POA: Diagnosis not present

## 2014-03-12 DIAGNOSIS — N186 End stage renal disease: Secondary | ICD-10-CM | POA: Diagnosis not present

## 2014-03-13 DIAGNOSIS — N186 End stage renal disease: Secondary | ICD-10-CM | POA: Diagnosis not present

## 2014-03-13 DIAGNOSIS — Z23 Encounter for immunization: Secondary | ICD-10-CM | POA: Diagnosis not present

## 2014-03-14 DIAGNOSIS — N186 End stage renal disease: Secondary | ICD-10-CM | POA: Diagnosis not present

## 2014-03-14 DIAGNOSIS — Z23 Encounter for immunization: Secondary | ICD-10-CM | POA: Diagnosis not present

## 2014-03-15 DIAGNOSIS — Z23 Encounter for immunization: Secondary | ICD-10-CM | POA: Diagnosis not present

## 2014-03-15 DIAGNOSIS — N186 End stage renal disease: Secondary | ICD-10-CM | POA: Diagnosis not present

## 2014-03-16 DIAGNOSIS — Z23 Encounter for immunization: Secondary | ICD-10-CM | POA: Diagnosis not present

## 2014-03-16 DIAGNOSIS — N186 End stage renal disease: Secondary | ICD-10-CM | POA: Diagnosis not present

## 2014-03-17 DIAGNOSIS — Z23 Encounter for immunization: Secondary | ICD-10-CM | POA: Diagnosis not present

## 2014-03-17 DIAGNOSIS — N186 End stage renal disease: Secondary | ICD-10-CM | POA: Diagnosis not present

## 2014-03-18 DIAGNOSIS — N186 End stage renal disease: Secondary | ICD-10-CM | POA: Diagnosis not present

## 2014-03-18 DIAGNOSIS — Z23 Encounter for immunization: Secondary | ICD-10-CM | POA: Diagnosis not present

## 2014-03-19 ENCOUNTER — Ambulatory Visit: Payer: Medicare Other

## 2014-03-19 DIAGNOSIS — N186 End stage renal disease: Secondary | ICD-10-CM | POA: Diagnosis not present

## 2014-03-19 DIAGNOSIS — Z23 Encounter for immunization: Secondary | ICD-10-CM | POA: Diagnosis not present

## 2014-03-20 DIAGNOSIS — Z23 Encounter for immunization: Secondary | ICD-10-CM | POA: Diagnosis not present

## 2014-03-20 DIAGNOSIS — N186 End stage renal disease: Secondary | ICD-10-CM | POA: Diagnosis not present

## 2014-03-21 DIAGNOSIS — Z23 Encounter for immunization: Secondary | ICD-10-CM | POA: Diagnosis not present

## 2014-03-21 DIAGNOSIS — N186 End stage renal disease: Secondary | ICD-10-CM | POA: Diagnosis not present

## 2014-03-22 DIAGNOSIS — N186 End stage renal disease: Secondary | ICD-10-CM | POA: Diagnosis not present

## 2014-03-22 DIAGNOSIS — Z23 Encounter for immunization: Secondary | ICD-10-CM | POA: Diagnosis not present

## 2014-03-23 DIAGNOSIS — Z23 Encounter for immunization: Secondary | ICD-10-CM | POA: Diagnosis not present

## 2014-03-23 DIAGNOSIS — N186 End stage renal disease: Secondary | ICD-10-CM | POA: Diagnosis not present

## 2014-03-24 DIAGNOSIS — Z23 Encounter for immunization: Secondary | ICD-10-CM | POA: Diagnosis not present

## 2014-03-24 DIAGNOSIS — N186 End stage renal disease: Secondary | ICD-10-CM | POA: Diagnosis not present

## 2014-03-25 ENCOUNTER — Ambulatory Visit (INDEPENDENT_AMBULATORY_CARE_PROVIDER_SITE_OTHER): Payer: Medicare Other

## 2014-03-25 VITALS — BP 148/75 | HR 58 | Resp 18

## 2014-03-25 DIAGNOSIS — N19 Unspecified kidney failure: Secondary | ICD-10-CM

## 2014-03-25 DIAGNOSIS — E1140 Type 2 diabetes mellitus with diabetic neuropathy, unspecified: Secondary | ICD-10-CM

## 2014-03-25 DIAGNOSIS — N186 End stage renal disease: Secondary | ICD-10-CM | POA: Diagnosis not present

## 2014-03-25 DIAGNOSIS — Z23 Encounter for immunization: Secondary | ICD-10-CM | POA: Diagnosis not present

## 2014-03-25 DIAGNOSIS — E1149 Type 2 diabetes mellitus with other diabetic neurological complication: Secondary | ICD-10-CM | POA: Diagnosis not present

## 2014-03-25 DIAGNOSIS — L608 Other nail disorders: Secondary | ICD-10-CM

## 2014-03-25 NOTE — Progress Notes (Signed)
   Subjective:    Patient ID: Linda Jordan, female    DOB: March 13, 1930, 80 y.o.   MRN: [id number]  HPI I JUST NEED MY TOENAILS TRIMMED UP     Review of Systems no new findings or systemic changes noted     Objective:   Physical Exam neurovascular status is unchanged pedal pulses are palpable DP postal for PT plus one over 4 bilateral capillary refill time 3 seconds sensations diminished on Semmes Weinstein to the forefoot digits and arch patient does have history peripheral neuropathy other prediabetic her diet-controlled patient is on renal failure dialysis protocols nails thick and brittle crumbly dystrophic although improved using Fungi-Nail was complete clearing a fungus nails is noted at this time for nails criptotic incurvated tender has a break in third nail left foot have bumped into something this crack nail is debrided at this time no other changes in history vascular status is intact open wounds or ulcers no secondary infections. Peripheral neuropathy as confirmed        Assessment & Plan:  Assessment this time possible diabetes with peripheral neuropathy and radiographic neuropathy criptotic incurvated friable nails debrided 1 through 5 bilateral return for future palliative care and as-needed basis contacted in changes or exacerbations or difficulties were to form,.  No open wounds no infection is noted  Harriet Masson DPM

## 2014-03-25 NOTE — Patient Instructions (Signed)

## 2014-03-26 DIAGNOSIS — N186 End stage renal disease: Secondary | ICD-10-CM | POA: Diagnosis not present

## 2014-03-26 DIAGNOSIS — Z23 Encounter for immunization: Secondary | ICD-10-CM | POA: Diagnosis not present

## 2014-03-27 DIAGNOSIS — N186 End stage renal disease: Secondary | ICD-10-CM | POA: Diagnosis not present

## 2014-03-27 DIAGNOSIS — Z23 Encounter for immunization: Secondary | ICD-10-CM | POA: Diagnosis not present

## 2014-03-28 DIAGNOSIS — Z23 Encounter for immunization: Secondary | ICD-10-CM | POA: Diagnosis not present

## 2014-03-28 DIAGNOSIS — N186 End stage renal disease: Secondary | ICD-10-CM | POA: Diagnosis not present

## 2014-03-29 DIAGNOSIS — Z23 Encounter for immunization: Secondary | ICD-10-CM | POA: Diagnosis not present

## 2014-03-29 DIAGNOSIS — N186 End stage renal disease: Secondary | ICD-10-CM | POA: Diagnosis not present

## 2014-03-30 DIAGNOSIS — Z23 Encounter for immunization: Secondary | ICD-10-CM | POA: Diagnosis not present

## 2014-03-30 DIAGNOSIS — N186 End stage renal disease: Secondary | ICD-10-CM | POA: Diagnosis not present

## 2014-03-31 DIAGNOSIS — Z23 Encounter for immunization: Secondary | ICD-10-CM | POA: Diagnosis not present

## 2014-03-31 DIAGNOSIS — N186 End stage renal disease: Secondary | ICD-10-CM | POA: Diagnosis not present

## 2014-04-01 DIAGNOSIS — N186 End stage renal disease: Secondary | ICD-10-CM | POA: Diagnosis not present

## 2014-04-01 DIAGNOSIS — Z23 Encounter for immunization: Secondary | ICD-10-CM | POA: Diagnosis not present

## 2014-04-02 DIAGNOSIS — Z23 Encounter for immunization: Secondary | ICD-10-CM | POA: Diagnosis not present

## 2014-04-02 DIAGNOSIS — N186 End stage renal disease: Secondary | ICD-10-CM | POA: Diagnosis not present

## 2014-04-03 DIAGNOSIS — N186 End stage renal disease: Secondary | ICD-10-CM | POA: Diagnosis not present

## 2014-04-03 DIAGNOSIS — Z23 Encounter for immunization: Secondary | ICD-10-CM | POA: Diagnosis not present

## 2014-04-04 DIAGNOSIS — Z23 Encounter for immunization: Secondary | ICD-10-CM | POA: Diagnosis not present

## 2014-04-04 DIAGNOSIS — N186 End stage renal disease: Secondary | ICD-10-CM | POA: Diagnosis not present

## 2014-04-05 DIAGNOSIS — N186 End stage renal disease: Secondary | ICD-10-CM | POA: Diagnosis not present

## 2014-04-05 DIAGNOSIS — Z23 Encounter for immunization: Secondary | ICD-10-CM | POA: Diagnosis not present

## 2014-04-06 DIAGNOSIS — Z23 Encounter for immunization: Secondary | ICD-10-CM | POA: Diagnosis not present

## 2014-04-06 DIAGNOSIS — N186 End stage renal disease: Secondary | ICD-10-CM | POA: Diagnosis not present

## 2014-04-07 DIAGNOSIS — N186 End stage renal disease: Secondary | ICD-10-CM | POA: Diagnosis not present

## 2014-04-07 DIAGNOSIS — Z23 Encounter for immunization: Secondary | ICD-10-CM | POA: Diagnosis not present

## 2014-04-08 DIAGNOSIS — N186 End stage renal disease: Secondary | ICD-10-CM | POA: Diagnosis not present

## 2014-04-08 DIAGNOSIS — Z23 Encounter for immunization: Secondary | ICD-10-CM | POA: Diagnosis not present

## 2014-04-09 DIAGNOSIS — Z23 Encounter for immunization: Secondary | ICD-10-CM | POA: Diagnosis not present

## 2014-04-09 DIAGNOSIS — N186 End stage renal disease: Secondary | ICD-10-CM | POA: Diagnosis not present

## 2014-04-14 DIAGNOSIS — K219 Gastro-esophageal reflux disease without esophagitis: Secondary | ICD-10-CM | POA: Diagnosis not present

## 2014-04-14 DIAGNOSIS — Z23 Encounter for immunization: Secondary | ICD-10-CM | POA: Diagnosis not present

## 2014-04-14 DIAGNOSIS — E039 Hypothyroidism, unspecified: Secondary | ICD-10-CM | POA: Diagnosis not present

## 2014-04-14 DIAGNOSIS — J309 Allergic rhinitis, unspecified: Secondary | ICD-10-CM | POA: Diagnosis not present

## 2014-04-14 DIAGNOSIS — E785 Hyperlipidemia, unspecified: Secondary | ICD-10-CM | POA: Diagnosis not present

## 2014-04-14 DIAGNOSIS — I82409 Acute embolism and thrombosis of unspecified deep veins of unspecified lower extremity: Secondary | ICD-10-CM | POA: Diagnosis not present

## 2014-05-09 DIAGNOSIS — N033 Chronic nephritic syndrome with diffuse mesangial proliferative glomerulonephritis: Secondary | ICD-10-CM | POA: Diagnosis not present

## 2014-05-09 DIAGNOSIS — Z992 Dependence on renal dialysis: Secondary | ICD-10-CM | POA: Diagnosis not present

## 2014-05-09 DIAGNOSIS — N186 End stage renal disease: Secondary | ICD-10-CM | POA: Diagnosis not present

## 2014-05-10 DIAGNOSIS — Z23 Encounter for immunization: Secondary | ICD-10-CM | POA: Diagnosis not present

## 2014-05-10 DIAGNOSIS — N186 End stage renal disease: Secondary | ICD-10-CM | POA: Diagnosis not present

## 2014-05-11 DIAGNOSIS — N186 End stage renal disease: Secondary | ICD-10-CM | POA: Diagnosis not present

## 2014-05-11 DIAGNOSIS — Z23 Encounter for immunization: Secondary | ICD-10-CM | POA: Diagnosis not present

## 2014-05-11 DIAGNOSIS — M542 Cervicalgia: Secondary | ICD-10-CM | POA: Diagnosis not present

## 2014-05-11 DIAGNOSIS — M9901 Segmental and somatic dysfunction of cervical region: Secondary | ICD-10-CM | POA: Diagnosis not present

## 2014-05-11 DIAGNOSIS — M9902 Segmental and somatic dysfunction of thoracic region: Secondary | ICD-10-CM | POA: Diagnosis not present

## 2014-05-12 DIAGNOSIS — M542 Cervicalgia: Secondary | ICD-10-CM | POA: Diagnosis not present

## 2014-05-12 DIAGNOSIS — M9902 Segmental and somatic dysfunction of thoracic region: Secondary | ICD-10-CM | POA: Diagnosis not present

## 2014-05-12 DIAGNOSIS — Z23 Encounter for immunization: Secondary | ICD-10-CM | POA: Diagnosis not present

## 2014-05-12 DIAGNOSIS — N186 End stage renal disease: Secondary | ICD-10-CM | POA: Diagnosis not present

## 2014-05-12 DIAGNOSIS — M9901 Segmental and somatic dysfunction of cervical region: Secondary | ICD-10-CM | POA: Diagnosis not present

## 2014-05-13 DIAGNOSIS — Z23 Encounter for immunization: Secondary | ICD-10-CM | POA: Diagnosis not present

## 2014-05-13 DIAGNOSIS — N186 End stage renal disease: Secondary | ICD-10-CM | POA: Diagnosis not present

## 2014-05-14 DIAGNOSIS — N186 End stage renal disease: Secondary | ICD-10-CM | POA: Diagnosis not present

## 2014-05-14 DIAGNOSIS — M542 Cervicalgia: Secondary | ICD-10-CM | POA: Diagnosis not present

## 2014-05-14 DIAGNOSIS — Z23 Encounter for immunization: Secondary | ICD-10-CM | POA: Diagnosis not present

## 2014-05-14 DIAGNOSIS — M9902 Segmental and somatic dysfunction of thoracic region: Secondary | ICD-10-CM | POA: Diagnosis not present

## 2014-05-14 DIAGNOSIS — M9901 Segmental and somatic dysfunction of cervical region: Secondary | ICD-10-CM | POA: Diagnosis not present

## 2014-05-15 DIAGNOSIS — K219 Gastro-esophageal reflux disease without esophagitis: Secondary | ICD-10-CM | POA: Diagnosis not present

## 2014-05-15 DIAGNOSIS — I82409 Acute embolism and thrombosis of unspecified deep veins of unspecified lower extremity: Secondary | ICD-10-CM | POA: Diagnosis not present

## 2014-05-15 DIAGNOSIS — Z23 Encounter for immunization: Secondary | ICD-10-CM | POA: Diagnosis not present

## 2014-05-15 DIAGNOSIS — E785 Hyperlipidemia, unspecified: Secondary | ICD-10-CM | POA: Diagnosis not present

## 2014-05-15 DIAGNOSIS — E039 Hypothyroidism, unspecified: Secondary | ICD-10-CM | POA: Diagnosis not present

## 2014-05-15 DIAGNOSIS — M159 Polyosteoarthritis, unspecified: Secondary | ICD-10-CM | POA: Diagnosis not present

## 2014-05-15 DIAGNOSIS — N186 End stage renal disease: Secondary | ICD-10-CM | POA: Diagnosis not present

## 2014-05-15 DIAGNOSIS — J309 Allergic rhinitis, unspecified: Secondary | ICD-10-CM | POA: Diagnosis not present

## 2014-05-15 DIAGNOSIS — E1121 Type 2 diabetes mellitus with diabetic nephropathy: Secondary | ICD-10-CM | POA: Diagnosis not present

## 2014-05-15 DIAGNOSIS — I10 Essential (primary) hypertension: Secondary | ICD-10-CM | POA: Diagnosis not present

## 2014-05-16 DIAGNOSIS — Z23 Encounter for immunization: Secondary | ICD-10-CM | POA: Diagnosis not present

## 2014-05-16 DIAGNOSIS — N186 End stage renal disease: Secondary | ICD-10-CM | POA: Diagnosis not present

## 2014-05-17 DIAGNOSIS — N186 End stage renal disease: Secondary | ICD-10-CM | POA: Diagnosis not present

## 2014-05-17 DIAGNOSIS — Z23 Encounter for immunization: Secondary | ICD-10-CM | POA: Diagnosis not present

## 2014-05-18 DIAGNOSIS — Z23 Encounter for immunization: Secondary | ICD-10-CM | POA: Diagnosis not present

## 2014-05-18 DIAGNOSIS — N186 End stage renal disease: Secondary | ICD-10-CM | POA: Diagnosis not present

## 2014-05-19 DIAGNOSIS — N186 End stage renal disease: Secondary | ICD-10-CM | POA: Diagnosis not present

## 2014-05-19 DIAGNOSIS — Z23 Encounter for immunization: Secondary | ICD-10-CM | POA: Diagnosis not present

## 2014-05-20 DIAGNOSIS — E039 Hypothyroidism, unspecified: Secondary | ICD-10-CM | POA: Diagnosis not present

## 2014-05-20 DIAGNOSIS — Z23 Encounter for immunization: Secondary | ICD-10-CM | POA: Diagnosis not present

## 2014-05-20 DIAGNOSIS — E785 Hyperlipidemia, unspecified: Secondary | ICD-10-CM | POA: Diagnosis not present

## 2014-05-20 DIAGNOSIS — J309 Allergic rhinitis, unspecified: Secondary | ICD-10-CM | POA: Diagnosis not present

## 2014-05-20 DIAGNOSIS — M25511 Pain in right shoulder: Secondary | ICD-10-CM | POA: Diagnosis not present

## 2014-05-20 DIAGNOSIS — N186 End stage renal disease: Secondary | ICD-10-CM | POA: Diagnosis not present

## 2014-05-20 DIAGNOSIS — E1121 Type 2 diabetes mellitus with diabetic nephropathy: Secondary | ICD-10-CM | POA: Diagnosis not present

## 2014-05-20 DIAGNOSIS — I82409 Acute embolism and thrombosis of unspecified deep veins of unspecified lower extremity: Secondary | ICD-10-CM | POA: Diagnosis not present

## 2014-05-20 DIAGNOSIS — I10 Essential (primary) hypertension: Secondary | ICD-10-CM | POA: Diagnosis not present

## 2014-05-20 DIAGNOSIS — K219 Gastro-esophageal reflux disease without esophagitis: Secondary | ICD-10-CM | POA: Diagnosis not present

## 2014-05-21 DIAGNOSIS — N186 End stage renal disease: Secondary | ICD-10-CM | POA: Diagnosis not present

## 2014-05-21 DIAGNOSIS — Z23 Encounter for immunization: Secondary | ICD-10-CM | POA: Diagnosis not present

## 2014-05-22 DIAGNOSIS — Z23 Encounter for immunization: Secondary | ICD-10-CM | POA: Diagnosis not present

## 2014-05-22 DIAGNOSIS — N186 End stage renal disease: Secondary | ICD-10-CM | POA: Diagnosis not present

## 2014-05-23 DIAGNOSIS — Z23 Encounter for immunization: Secondary | ICD-10-CM | POA: Diagnosis not present

## 2014-05-23 DIAGNOSIS — N186 End stage renal disease: Secondary | ICD-10-CM | POA: Diagnosis not present

## 2014-05-24 DIAGNOSIS — N186 End stage renal disease: Secondary | ICD-10-CM | POA: Diagnosis not present

## 2014-05-24 DIAGNOSIS — Z23 Encounter for immunization: Secondary | ICD-10-CM | POA: Diagnosis not present

## 2014-05-25 DIAGNOSIS — Z23 Encounter for immunization: Secondary | ICD-10-CM | POA: Diagnosis not present

## 2014-05-25 DIAGNOSIS — N186 End stage renal disease: Secondary | ICD-10-CM | POA: Diagnosis not present

## 2014-05-26 DIAGNOSIS — Z23 Encounter for immunization: Secondary | ICD-10-CM | POA: Diagnosis not present

## 2014-05-26 DIAGNOSIS — N186 End stage renal disease: Secondary | ICD-10-CM | POA: Diagnosis not present

## 2014-05-27 DIAGNOSIS — Z23 Encounter for immunization: Secondary | ICD-10-CM | POA: Diagnosis not present

## 2014-05-27 DIAGNOSIS — N186 End stage renal disease: Secondary | ICD-10-CM | POA: Diagnosis not present

## 2014-05-28 DIAGNOSIS — N186 End stage renal disease: Secondary | ICD-10-CM | POA: Diagnosis not present

## 2014-05-28 DIAGNOSIS — Z23 Encounter for immunization: Secondary | ICD-10-CM | POA: Diagnosis not present

## 2014-05-29 DIAGNOSIS — N186 End stage renal disease: Secondary | ICD-10-CM | POA: Diagnosis not present

## 2014-05-29 DIAGNOSIS — Z23 Encounter for immunization: Secondary | ICD-10-CM | POA: Diagnosis not present

## 2014-05-30 DIAGNOSIS — N186 End stage renal disease: Secondary | ICD-10-CM | POA: Diagnosis not present

## 2014-05-30 DIAGNOSIS — Z23 Encounter for immunization: Secondary | ICD-10-CM | POA: Diagnosis not present

## 2014-05-31 DIAGNOSIS — N186 End stage renal disease: Secondary | ICD-10-CM | POA: Diagnosis not present

## 2014-05-31 DIAGNOSIS — Z23 Encounter for immunization: Secondary | ICD-10-CM | POA: Diagnosis not present

## 2014-06-01 DIAGNOSIS — E780 Pure hypercholesterolemia: Secondary | ICD-10-CM | POA: Diagnosis not present

## 2014-06-01 DIAGNOSIS — T404X5A Adverse effect of other synthetic narcotics, initial encounter: Secondary | ICD-10-CM | POA: Diagnosis not present

## 2014-06-01 DIAGNOSIS — S63502A Unspecified sprain of left wrist, initial encounter: Secondary | ICD-10-CM | POA: Diagnosis not present

## 2014-06-01 DIAGNOSIS — Z992 Dependence on renal dialysis: Secondary | ICD-10-CM | POA: Diagnosis not present

## 2014-06-01 DIAGNOSIS — R270 Ataxia, unspecified: Secondary | ICD-10-CM | POA: Diagnosis not present

## 2014-06-01 DIAGNOSIS — R42 Dizziness and giddiness: Secondary | ICD-10-CM | POA: Diagnosis not present

## 2014-06-01 DIAGNOSIS — I120 Hypertensive chronic kidney disease with stage 5 chronic kidney disease or end stage renal disease: Secondary | ICD-10-CM | POA: Diagnosis not present

## 2014-06-01 DIAGNOSIS — N186 End stage renal disease: Secondary | ICD-10-CM | POA: Diagnosis not present

## 2014-06-01 DIAGNOSIS — M5412 Radiculopathy, cervical region: Secondary | ICD-10-CM | POA: Diagnosis not present

## 2014-06-01 DIAGNOSIS — R296 Repeated falls: Secondary | ICD-10-CM | POA: Diagnosis not present

## 2014-06-01 DIAGNOSIS — M25532 Pain in left wrist: Secondary | ICD-10-CM | POA: Diagnosis not present

## 2014-06-01 DIAGNOSIS — M19032 Primary osteoarthritis, left wrist: Secondary | ICD-10-CM | POA: Diagnosis not present

## 2014-06-01 DIAGNOSIS — E1121 Type 2 diabetes mellitus with diabetic nephropathy: Secondary | ICD-10-CM | POA: Diagnosis not present

## 2014-06-01 DIAGNOSIS — R531 Weakness: Secondary | ICD-10-CM | POA: Diagnosis not present

## 2014-06-01 DIAGNOSIS — Z7982 Long term (current) use of aspirin: Secondary | ICD-10-CM | POA: Diagnosis not present

## 2014-06-01 DIAGNOSIS — I10 Essential (primary) hypertension: Secondary | ICD-10-CM | POA: Diagnosis not present

## 2014-06-01 DIAGNOSIS — Z23 Encounter for immunization: Secondary | ICD-10-CM | POA: Diagnosis not present

## 2014-06-01 DIAGNOSIS — Z6823 Body mass index (BMI) 23.0-23.9, adult: Secondary | ICD-10-CM | POA: Diagnosis not present

## 2014-06-01 DIAGNOSIS — N185 Chronic kidney disease, stage 5: Secondary | ICD-10-CM | POA: Diagnosis not present

## 2014-06-02 DIAGNOSIS — N186 End stage renal disease: Secondary | ICD-10-CM | POA: Diagnosis not present

## 2014-06-02 DIAGNOSIS — Z23 Encounter for immunization: Secondary | ICD-10-CM | POA: Diagnosis not present

## 2014-06-03 DIAGNOSIS — Z23 Encounter for immunization: Secondary | ICD-10-CM | POA: Diagnosis not present

## 2014-06-03 DIAGNOSIS — N186 End stage renal disease: Secondary | ICD-10-CM | POA: Diagnosis not present

## 2014-06-04 DIAGNOSIS — N186 End stage renal disease: Secondary | ICD-10-CM | POA: Diagnosis not present

## 2014-06-04 DIAGNOSIS — Z23 Encounter for immunization: Secondary | ICD-10-CM | POA: Diagnosis not present

## 2014-06-05 DIAGNOSIS — N186 End stage renal disease: Secondary | ICD-10-CM | POA: Diagnosis not present

## 2014-06-05 DIAGNOSIS — Z23 Encounter for immunization: Secondary | ICD-10-CM | POA: Diagnosis not present

## 2014-06-06 DIAGNOSIS — Z23 Encounter for immunization: Secondary | ICD-10-CM | POA: Diagnosis not present

## 2014-06-06 DIAGNOSIS — N186 End stage renal disease: Secondary | ICD-10-CM | POA: Diagnosis not present

## 2014-06-07 DIAGNOSIS — Z23 Encounter for immunization: Secondary | ICD-10-CM | POA: Diagnosis not present

## 2014-06-07 DIAGNOSIS — N186 End stage renal disease: Secondary | ICD-10-CM | POA: Diagnosis not present

## 2014-06-08 DIAGNOSIS — N186 End stage renal disease: Secondary | ICD-10-CM | POA: Diagnosis not present

## 2014-06-08 DIAGNOSIS — Z23 Encounter for immunization: Secondary | ICD-10-CM | POA: Diagnosis not present

## 2014-06-08 DIAGNOSIS — Z992 Dependence on renal dialysis: Secondary | ICD-10-CM | POA: Diagnosis not present

## 2014-06-08 DIAGNOSIS — N033 Chronic nephritic syndrome with diffuse mesangial proliferative glomerulonephritis: Secondary | ICD-10-CM | POA: Diagnosis not present

## 2014-06-08 DIAGNOSIS — M25532 Pain in left wrist: Secondary | ICD-10-CM | POA: Diagnosis not present

## 2014-06-09 DIAGNOSIS — N186 End stage renal disease: Secondary | ICD-10-CM | POA: Diagnosis not present

## 2014-06-09 DIAGNOSIS — Z992 Dependence on renal dialysis: Secondary | ICD-10-CM | POA: Diagnosis not present

## 2014-06-10 DIAGNOSIS — N186 End stage renal disease: Secondary | ICD-10-CM | POA: Diagnosis not present

## 2014-06-10 DIAGNOSIS — Z992 Dependence on renal dialysis: Secondary | ICD-10-CM | POA: Diagnosis not present

## 2014-06-11 DIAGNOSIS — N186 End stage renal disease: Secondary | ICD-10-CM | POA: Diagnosis not present

## 2014-06-11 DIAGNOSIS — Z992 Dependence on renal dialysis: Secondary | ICD-10-CM | POA: Diagnosis not present

## 2014-06-12 DIAGNOSIS — Z992 Dependence on renal dialysis: Secondary | ICD-10-CM | POA: Diagnosis not present

## 2014-06-12 DIAGNOSIS — N186 End stage renal disease: Secondary | ICD-10-CM | POA: Diagnosis not present

## 2014-06-13 DIAGNOSIS — Z992 Dependence on renal dialysis: Secondary | ICD-10-CM | POA: Diagnosis not present

## 2014-06-13 DIAGNOSIS — N186 End stage renal disease: Secondary | ICD-10-CM | POA: Diagnosis not present

## 2014-06-14 DIAGNOSIS — Z992 Dependence on renal dialysis: Secondary | ICD-10-CM | POA: Diagnosis not present

## 2014-06-14 DIAGNOSIS — N186 End stage renal disease: Secondary | ICD-10-CM | POA: Diagnosis not present

## 2014-06-15 DIAGNOSIS — J449 Chronic obstructive pulmonary disease, unspecified: Secondary | ICD-10-CM | POA: Diagnosis not present

## 2014-06-15 DIAGNOSIS — Z992 Dependence on renal dialysis: Secondary | ICD-10-CM | POA: Diagnosis not present

## 2014-06-15 DIAGNOSIS — K219 Gastro-esophageal reflux disease without esophagitis: Secondary | ICD-10-CM | POA: Diagnosis not present

## 2014-06-15 DIAGNOSIS — R197 Diarrhea, unspecified: Secondary | ICD-10-CM | POA: Diagnosis not present

## 2014-06-15 DIAGNOSIS — J309 Allergic rhinitis, unspecified: Secondary | ICD-10-CM | POA: Diagnosis not present

## 2014-06-15 DIAGNOSIS — M159 Polyosteoarthritis, unspecified: Secondary | ICD-10-CM | POA: Diagnosis not present

## 2014-06-15 DIAGNOSIS — E1121 Type 2 diabetes mellitus with diabetic nephropathy: Secondary | ICD-10-CM | POA: Diagnosis not present

## 2014-06-15 DIAGNOSIS — N186 End stage renal disease: Secondary | ICD-10-CM | POA: Diagnosis not present

## 2014-06-15 DIAGNOSIS — E039 Hypothyroidism, unspecified: Secondary | ICD-10-CM | POA: Diagnosis not present

## 2014-06-15 DIAGNOSIS — I10 Essential (primary) hypertension: Secondary | ICD-10-CM | POA: Diagnosis not present

## 2014-06-16 DIAGNOSIS — N186 End stage renal disease: Secondary | ICD-10-CM | POA: Diagnosis not present

## 2014-06-16 DIAGNOSIS — Z992 Dependence on renal dialysis: Secondary | ICD-10-CM | POA: Diagnosis not present

## 2014-06-17 DIAGNOSIS — N186 End stage renal disease: Secondary | ICD-10-CM | POA: Diagnosis not present

## 2014-06-17 DIAGNOSIS — Z992 Dependence on renal dialysis: Secondary | ICD-10-CM | POA: Diagnosis not present

## 2014-06-18 DIAGNOSIS — Z992 Dependence on renal dialysis: Secondary | ICD-10-CM | POA: Diagnosis not present

## 2014-06-18 DIAGNOSIS — N186 End stage renal disease: Secondary | ICD-10-CM | POA: Diagnosis not present

## 2014-06-19 DIAGNOSIS — N186 End stage renal disease: Secondary | ICD-10-CM | POA: Diagnosis not present

## 2014-06-19 DIAGNOSIS — Z992 Dependence on renal dialysis: Secondary | ICD-10-CM | POA: Diagnosis not present

## 2014-06-20 DIAGNOSIS — Z992 Dependence on renal dialysis: Secondary | ICD-10-CM | POA: Diagnosis not present

## 2014-06-20 DIAGNOSIS — N186 End stage renal disease: Secondary | ICD-10-CM | POA: Diagnosis not present

## 2014-06-21 DIAGNOSIS — Z992 Dependence on renal dialysis: Secondary | ICD-10-CM | POA: Diagnosis not present

## 2014-06-21 DIAGNOSIS — N186 End stage renal disease: Secondary | ICD-10-CM | POA: Diagnosis not present

## 2014-06-22 DIAGNOSIS — N186 End stage renal disease: Secondary | ICD-10-CM | POA: Diagnosis not present

## 2014-06-22 DIAGNOSIS — Z992 Dependence on renal dialysis: Secondary | ICD-10-CM | POA: Diagnosis not present

## 2014-06-23 DIAGNOSIS — N186 End stage renal disease: Secondary | ICD-10-CM | POA: Diagnosis not present

## 2014-06-23 DIAGNOSIS — E079 Disorder of thyroid, unspecified: Secondary | ICD-10-CM | POA: Diagnosis not present

## 2014-06-23 DIAGNOSIS — Z992 Dependence on renal dialysis: Secondary | ICD-10-CM | POA: Diagnosis not present

## 2014-06-24 DIAGNOSIS — N186 End stage renal disease: Secondary | ICD-10-CM | POA: Diagnosis not present

## 2014-06-24 DIAGNOSIS — Z992 Dependence on renal dialysis: Secondary | ICD-10-CM | POA: Diagnosis not present

## 2014-06-25 DIAGNOSIS — Z992 Dependence on renal dialysis: Secondary | ICD-10-CM | POA: Diagnosis not present

## 2014-06-25 DIAGNOSIS — N186 End stage renal disease: Secondary | ICD-10-CM | POA: Diagnosis not present

## 2014-06-26 DIAGNOSIS — N186 End stage renal disease: Secondary | ICD-10-CM | POA: Diagnosis not present

## 2014-06-26 DIAGNOSIS — Z992 Dependence on renal dialysis: Secondary | ICD-10-CM | POA: Diagnosis not present

## 2014-06-27 DIAGNOSIS — Z992 Dependence on renal dialysis: Secondary | ICD-10-CM | POA: Diagnosis not present

## 2014-06-27 DIAGNOSIS — N186 End stage renal disease: Secondary | ICD-10-CM | POA: Diagnosis not present

## 2014-06-28 DIAGNOSIS — Z992 Dependence on renal dialysis: Secondary | ICD-10-CM | POA: Diagnosis not present

## 2014-06-28 DIAGNOSIS — N186 End stage renal disease: Secondary | ICD-10-CM | POA: Diagnosis not present

## 2014-06-29 ENCOUNTER — Ambulatory Visit: Payer: Medicare Other

## 2014-06-29 DIAGNOSIS — M25532 Pain in left wrist: Secondary | ICD-10-CM | POA: Diagnosis not present

## 2014-06-29 DIAGNOSIS — Z992 Dependence on renal dialysis: Secondary | ICD-10-CM | POA: Diagnosis not present

## 2014-06-29 DIAGNOSIS — N186 End stage renal disease: Secondary | ICD-10-CM | POA: Diagnosis not present

## 2014-06-30 DIAGNOSIS — N186 End stage renal disease: Secondary | ICD-10-CM | POA: Diagnosis not present

## 2014-06-30 DIAGNOSIS — Z992 Dependence on renal dialysis: Secondary | ICD-10-CM | POA: Diagnosis not present

## 2014-07-01 DIAGNOSIS — M25432 Effusion, left wrist: Secondary | ICD-10-CM | POA: Diagnosis not present

## 2014-07-01 DIAGNOSIS — M25532 Pain in left wrist: Secondary | ICD-10-CM | POA: Diagnosis not present

## 2014-07-01 DIAGNOSIS — Z992 Dependence on renal dialysis: Secondary | ICD-10-CM | POA: Diagnosis not present

## 2014-07-01 DIAGNOSIS — N186 End stage renal disease: Secondary | ICD-10-CM | POA: Diagnosis not present

## 2014-07-01 DIAGNOSIS — M7989 Other specified soft tissue disorders: Secondary | ICD-10-CM | POA: Diagnosis not present

## 2014-07-02 DIAGNOSIS — N186 End stage renal disease: Secondary | ICD-10-CM | POA: Diagnosis not present

## 2014-07-02 DIAGNOSIS — Z992 Dependence on renal dialysis: Secondary | ICD-10-CM | POA: Diagnosis not present

## 2014-07-03 DIAGNOSIS — N186 End stage renal disease: Secondary | ICD-10-CM | POA: Diagnosis not present

## 2014-07-03 DIAGNOSIS — Z992 Dependence on renal dialysis: Secondary | ICD-10-CM | POA: Diagnosis not present

## 2014-07-04 DIAGNOSIS — Z992 Dependence on renal dialysis: Secondary | ICD-10-CM | POA: Diagnosis not present

## 2014-07-04 DIAGNOSIS — N186 End stage renal disease: Secondary | ICD-10-CM | POA: Diagnosis not present

## 2014-07-05 DIAGNOSIS — Z992 Dependence on renal dialysis: Secondary | ICD-10-CM | POA: Diagnosis not present

## 2014-07-05 DIAGNOSIS — N186 End stage renal disease: Secondary | ICD-10-CM | POA: Diagnosis not present

## 2014-07-06 ENCOUNTER — Ambulatory Visit (INDEPENDENT_AMBULATORY_CARE_PROVIDER_SITE_OTHER): Payer: Medicare Other

## 2014-07-06 VITALS — BP 160/80 | HR 77 | Resp 12

## 2014-07-06 DIAGNOSIS — L603 Nail dystrophy: Secondary | ICD-10-CM | POA: Diagnosis not present

## 2014-07-06 DIAGNOSIS — E1140 Type 2 diabetes mellitus with diabetic neuropathy, unspecified: Secondary | ICD-10-CM | POA: Diagnosis not present

## 2014-07-06 DIAGNOSIS — Z992 Dependence on renal dialysis: Secondary | ICD-10-CM | POA: Diagnosis not present

## 2014-07-06 DIAGNOSIS — N186 End stage renal disease: Secondary | ICD-10-CM | POA: Diagnosis not present

## 2014-07-06 DIAGNOSIS — N19 Unspecified kidney failure: Secondary | ICD-10-CM

## 2014-07-06 NOTE — Progress Notes (Signed)
   Subjective:    Patient ID: Linda Jordan, female    DOB: August 05, 1929, 80 y.o.   MRN: [id number]  HPI  TOENAILS TRIM.  Review of Systems no new findings or systemic changes noted     Objective:   Physical Exam Neurovascular status is unchanged pulses palpable DP +2 over 4 bilateral PT 1 over 4 bilateral Refill time 3 seconds all digits. There is history of peripheral neuropathy decreased sensation of the forefoot and digits diet-controlled diabetes does have history of renal failure. Thick brittle [place] friable dystrophic nails 1 through 5 bilateral debrided no open wounds no ulcers no secondary infection is noted.       Assessment & Plan:  Assessment this time is diabetes controlled with history peripheral neuropathy painful friable mycotic nails debrided 1 through 5 bilateral return for future palliative nail care diabetic foot nail care in the future as needed  Harriet Masson DPM

## 2014-07-06 NOTE — Patient Instructions (Signed)
Diabetes and Foot Care Diabetes may cause you to have problems because of poor blood supply (circulation) to your feet and legs. This may cause the skin on your feet to become thinner, break easier, and heal more slowly. Your skin may become dry, and the skin may peel and crack. You may also have nerve damage in your legs and feet causing decreased feeling in them. You may not notice minor injuries to your feet that could lead to infections or more serious problems. Taking care of your feet is one of the most important things you can do for yourself.  HOME CARE INSTRUCTIONS  Wear shoes at all times, even in the house. Do not go barefoot. Bare feet are easily injured.  Check your feet daily for blisters, cuts, and redness. If you cannot see the bottom of your feet, use a mirror or ask someone for help.  Wash your feet with warm water (do not use hot water) and mild soap. Then pat your feet and the areas between your toes until they are completely dry. Do not soak your feet as this can dry your skin.  Apply a moisturizing lotion or petroleum jelly (that does not contain alcohol and is unscented) to the skin on your feet and to dry, brittle toenails. Do not apply lotion between your toes.  Trim your toenails straight across. Do not dig under them or around the cuticle. File the edges of your nails with an emery board or nail file.  Do not cut corns or calluses or try to remove them with medicine.  Wear clean socks or stockings every day. Make sure they are not too tight. Do not wear knee-high stockings since they may decrease blood flow to your legs.  Wear shoes that fit properly and have enough cushioning. To break in new shoes, wear them for just a few hours a day. This prevents you from injuring your feet. Always look in your shoes before you put them on to be sure there are no objects inside.  Do not cross your legs. This may decrease the blood flow to your feet.  If you find a minor scrape,  cut, or break in the skin on your feet, keep it and the skin around it clean and dry. These areas may be cleansed with mild soap and water. Do not cleanse the area with peroxide, alcohol, or iodine.  When you remove an adhesive bandage, be sure not to damage the skin around it.  If you have a wound, look at it several times a day to make sure it is healing.  Do not use heating pads or hot water bottles. They may burn your skin. If you have lost feeling in your feet or legs, you may not know it is happening until it is too late.  Make sure your health care provider performs a complete foot exam at least annually or more often if you have foot problems. Report any cuts, sores, or bruises to your health care provider immediately. SEEK MEDICAL CARE IF:   You have an injury that is not healing.  You have cuts or breaks in the skin.  You have an ingrown nail.  You notice redness on your legs or feet.  You feel burning or tingling in your legs or feet.  You have pain or cramps in your legs and feet.  Your legs or feet are numb.  Your feet always feel cold. SEEK IMMEDIATE MEDICAL CARE IF:   There is increasing redness,   swelling, or pain in or around a wound.  There is a red line that goes up your leg.  Pus is coming from a wound.  You develop a fever or as directed by your health care provider.  You notice a bad smell coming from an ulcer or wound. Document Released: 06/23/2000 Document Revised: 02/26/2013 Document Reviewed: 12/03/2012 Exit[employer] Patient Information 2015 [employer]. This information is not intended to replace advice given to you by your health care provider. Make sure you discuss any questions you have with your health care provider.  

## 2014-07-07 DIAGNOSIS — N186 End stage renal disease: Secondary | ICD-10-CM | POA: Diagnosis not present

## 2014-07-07 DIAGNOSIS — Z992 Dependence on renal dialysis: Secondary | ICD-10-CM | POA: Diagnosis not present

## 2014-07-08 DIAGNOSIS — N186 End stage renal disease: Secondary | ICD-10-CM | POA: Diagnosis not present

## 2014-07-08 DIAGNOSIS — Z992 Dependence on renal dialysis: Secondary | ICD-10-CM | POA: Diagnosis not present

## 2014-07-09 DIAGNOSIS — Z992 Dependence on renal dialysis: Secondary | ICD-10-CM | POA: Diagnosis not present

## 2014-07-09 DIAGNOSIS — M25332 Other instability, left wrist: Secondary | ICD-10-CM | POA: Diagnosis not present

## 2014-07-09 DIAGNOSIS — N033 Chronic nephritic syndrome with diffuse mesangial proliferative glomerulonephritis: Secondary | ICD-10-CM | POA: Diagnosis not present

## 2014-07-09 DIAGNOSIS — M25532 Pain in left wrist: Secondary | ICD-10-CM | POA: Diagnosis not present

## 2014-07-09 DIAGNOSIS — N186 End stage renal disease: Secondary | ICD-10-CM | POA: Diagnosis not present

## 2014-07-10 DIAGNOSIS — Z992 Dependence on renal dialysis: Secondary | ICD-10-CM | POA: Diagnosis not present

## 2014-07-10 DIAGNOSIS — N186 End stage renal disease: Secondary | ICD-10-CM | POA: Diagnosis not present

## 2014-07-11 DIAGNOSIS — Z992 Dependence on renal dialysis: Secondary | ICD-10-CM | POA: Diagnosis not present

## 2014-07-11 DIAGNOSIS — N186 End stage renal disease: Secondary | ICD-10-CM | POA: Diagnosis not present

## 2014-07-12 DIAGNOSIS — N186 End stage renal disease: Secondary | ICD-10-CM | POA: Diagnosis not present

## 2014-07-12 DIAGNOSIS — Z992 Dependence on renal dialysis: Secondary | ICD-10-CM | POA: Diagnosis not present

## 2014-07-13 DIAGNOSIS — N186 End stage renal disease: Secondary | ICD-10-CM | POA: Diagnosis not present

## 2014-07-13 DIAGNOSIS — Z992 Dependence on renal dialysis: Secondary | ICD-10-CM | POA: Diagnosis not present

## 2014-07-14 DIAGNOSIS — Z992 Dependence on renal dialysis: Secondary | ICD-10-CM | POA: Diagnosis not present

## 2014-07-14 DIAGNOSIS — N186 End stage renal disease: Secondary | ICD-10-CM | POA: Diagnosis not present

## 2014-07-15 DIAGNOSIS — Z992 Dependence on renal dialysis: Secondary | ICD-10-CM | POA: Diagnosis not present

## 2014-07-15 DIAGNOSIS — N186 End stage renal disease: Secondary | ICD-10-CM | POA: Diagnosis not present

## 2014-07-16 DIAGNOSIS — Z992 Dependence on renal dialysis: Secondary | ICD-10-CM | POA: Diagnosis not present

## 2014-07-16 DIAGNOSIS — N186 End stage renal disease: Secondary | ICD-10-CM | POA: Diagnosis not present

## 2014-07-17 DIAGNOSIS — Z992 Dependence on renal dialysis: Secondary | ICD-10-CM | POA: Diagnosis not present

## 2014-07-17 DIAGNOSIS — N186 End stage renal disease: Secondary | ICD-10-CM | POA: Diagnosis not present

## 2014-07-18 DIAGNOSIS — Z992 Dependence on renal dialysis: Secondary | ICD-10-CM | POA: Diagnosis not present

## 2014-07-18 DIAGNOSIS — N186 End stage renal disease: Secondary | ICD-10-CM | POA: Diagnosis not present

## 2014-07-19 DIAGNOSIS — Z992 Dependence on renal dialysis: Secondary | ICD-10-CM | POA: Diagnosis not present

## 2014-07-19 DIAGNOSIS — N186 End stage renal disease: Secondary | ICD-10-CM | POA: Diagnosis not present

## 2014-07-20 DIAGNOSIS — N186 End stage renal disease: Secondary | ICD-10-CM | POA: Diagnosis not present

## 2014-07-20 DIAGNOSIS — Z992 Dependence on renal dialysis: Secondary | ICD-10-CM | POA: Diagnosis not present

## 2014-07-21 DIAGNOSIS — N186 End stage renal disease: Secondary | ICD-10-CM | POA: Diagnosis not present

## 2014-07-21 DIAGNOSIS — Z992 Dependence on renal dialysis: Secondary | ICD-10-CM | POA: Diagnosis not present

## 2014-07-22 DIAGNOSIS — Z992 Dependence on renal dialysis: Secondary | ICD-10-CM | POA: Diagnosis not present

## 2014-07-22 DIAGNOSIS — N186 End stage renal disease: Secondary | ICD-10-CM | POA: Diagnosis not present

## 2014-07-23 DIAGNOSIS — Z992 Dependence on renal dialysis: Secondary | ICD-10-CM | POA: Diagnosis not present

## 2014-07-23 DIAGNOSIS — N186 End stage renal disease: Secondary | ICD-10-CM | POA: Diagnosis not present

## 2014-07-24 DIAGNOSIS — Z992 Dependence on renal dialysis: Secondary | ICD-10-CM | POA: Diagnosis not present

## 2014-07-24 DIAGNOSIS — N186 End stage renal disease: Secondary | ICD-10-CM | POA: Diagnosis not present

## 2014-07-25 DIAGNOSIS — N186 End stage renal disease: Secondary | ICD-10-CM | POA: Diagnosis not present

## 2014-07-25 DIAGNOSIS — Z992 Dependence on renal dialysis: Secondary | ICD-10-CM | POA: Diagnosis not present

## 2014-07-26 DIAGNOSIS — Z992 Dependence on renal dialysis: Secondary | ICD-10-CM | POA: Diagnosis not present

## 2014-07-26 DIAGNOSIS — N186 End stage renal disease: Secondary | ICD-10-CM | POA: Diagnosis not present

## 2014-07-27 DIAGNOSIS — Z992 Dependence on renal dialysis: Secondary | ICD-10-CM | POA: Diagnosis not present

## 2014-07-27 DIAGNOSIS — N186 End stage renal disease: Secondary | ICD-10-CM | POA: Diagnosis not present

## 2014-07-28 DIAGNOSIS — Z992 Dependence on renal dialysis: Secondary | ICD-10-CM | POA: Diagnosis not present

## 2014-07-28 DIAGNOSIS — N186 End stage renal disease: Secondary | ICD-10-CM | POA: Diagnosis not present

## 2014-07-29 DIAGNOSIS — Z992 Dependence on renal dialysis: Secondary | ICD-10-CM | POA: Diagnosis not present

## 2014-07-29 DIAGNOSIS — M1812 Unilateral primary osteoarthritis of first carpometacarpal joint, left hand: Secondary | ICD-10-CM | POA: Diagnosis not present

## 2014-07-29 DIAGNOSIS — N186 End stage renal disease: Secondary | ICD-10-CM | POA: Diagnosis not present

## 2014-07-30 DIAGNOSIS — Z992 Dependence on renal dialysis: Secondary | ICD-10-CM | POA: Diagnosis not present

## 2014-07-30 DIAGNOSIS — N186 End stage renal disease: Secondary | ICD-10-CM | POA: Diagnosis not present

## 2014-07-31 DIAGNOSIS — Z992 Dependence on renal dialysis: Secondary | ICD-10-CM | POA: Diagnosis not present

## 2014-07-31 DIAGNOSIS — N186 End stage renal disease: Secondary | ICD-10-CM | POA: Diagnosis not present

## 2014-08-01 DIAGNOSIS — N186 End stage renal disease: Secondary | ICD-10-CM | POA: Diagnosis not present

## 2014-08-01 DIAGNOSIS — Z992 Dependence on renal dialysis: Secondary | ICD-10-CM | POA: Diagnosis not present

## 2014-08-02 DIAGNOSIS — N186 End stage renal disease: Secondary | ICD-10-CM | POA: Diagnosis not present

## 2014-08-02 DIAGNOSIS — Z992 Dependence on renal dialysis: Secondary | ICD-10-CM | POA: Diagnosis not present

## 2014-08-03 DIAGNOSIS — N186 End stage renal disease: Secondary | ICD-10-CM | POA: Diagnosis not present

## 2014-08-03 DIAGNOSIS — Z992 Dependence on renal dialysis: Secondary | ICD-10-CM | POA: Diagnosis not present

## 2014-08-04 DIAGNOSIS — Z992 Dependence on renal dialysis: Secondary | ICD-10-CM | POA: Diagnosis not present

## 2014-08-04 DIAGNOSIS — N186 End stage renal disease: Secondary | ICD-10-CM | POA: Diagnosis not present

## 2014-08-05 DIAGNOSIS — N186 End stage renal disease: Secondary | ICD-10-CM | POA: Diagnosis not present

## 2014-08-05 DIAGNOSIS — Z992 Dependence on renal dialysis: Secondary | ICD-10-CM | POA: Diagnosis not present

## 2014-08-06 DIAGNOSIS — N186 End stage renal disease: Secondary | ICD-10-CM | POA: Diagnosis not present

## 2014-08-06 DIAGNOSIS — Z992 Dependence on renal dialysis: Secondary | ICD-10-CM | POA: Diagnosis not present

## 2014-08-07 DIAGNOSIS — Z992 Dependence on renal dialysis: Secondary | ICD-10-CM | POA: Diagnosis not present

## 2014-08-07 DIAGNOSIS — N186 End stage renal disease: Secondary | ICD-10-CM | POA: Diagnosis not present

## 2014-08-08 DIAGNOSIS — Z992 Dependence on renal dialysis: Secondary | ICD-10-CM | POA: Diagnosis not present

## 2014-08-08 DIAGNOSIS — N186 End stage renal disease: Secondary | ICD-10-CM | POA: Diagnosis not present

## 2014-08-09 DIAGNOSIS — N186 End stage renal disease: Secondary | ICD-10-CM | POA: Diagnosis not present

## 2014-08-09 DIAGNOSIS — Z992 Dependence on renal dialysis: Secondary | ICD-10-CM | POA: Diagnosis not present

## 2014-08-09 DIAGNOSIS — N033 Chronic nephritic syndrome with diffuse mesangial proliferative glomerulonephritis: Secondary | ICD-10-CM | POA: Diagnosis not present

## 2014-08-10 DIAGNOSIS — N186 End stage renal disease: Secondary | ICD-10-CM | POA: Diagnosis not present

## 2014-08-10 DIAGNOSIS — Z992 Dependence on renal dialysis: Secondary | ICD-10-CM | POA: Diagnosis not present

## 2014-08-11 DIAGNOSIS — N186 End stage renal disease: Secondary | ICD-10-CM | POA: Diagnosis not present

## 2014-08-11 DIAGNOSIS — Z992 Dependence on renal dialysis: Secondary | ICD-10-CM | POA: Diagnosis not present

## 2014-08-12 DIAGNOSIS — N186 End stage renal disease: Secondary | ICD-10-CM | POA: Diagnosis not present

## 2014-08-12 DIAGNOSIS — Z992 Dependence on renal dialysis: Secondary | ICD-10-CM | POA: Diagnosis not present

## 2014-08-13 DIAGNOSIS — N186 End stage renal disease: Secondary | ICD-10-CM | POA: Diagnosis not present

## 2014-08-13 DIAGNOSIS — Z992 Dependence on renal dialysis: Secondary | ICD-10-CM | POA: Diagnosis not present

## 2014-08-14 DIAGNOSIS — Z992 Dependence on renal dialysis: Secondary | ICD-10-CM | POA: Diagnosis not present

## 2014-08-14 DIAGNOSIS — N186 End stage renal disease: Secondary | ICD-10-CM | POA: Diagnosis not present

## 2014-08-15 DIAGNOSIS — Z992 Dependence on renal dialysis: Secondary | ICD-10-CM | POA: Diagnosis not present

## 2014-08-15 DIAGNOSIS — N186 End stage renal disease: Secondary | ICD-10-CM | POA: Diagnosis not present

## 2014-08-16 DIAGNOSIS — Z992 Dependence on renal dialysis: Secondary | ICD-10-CM | POA: Diagnosis not present

## 2014-08-16 DIAGNOSIS — N186 End stage renal disease: Secondary | ICD-10-CM | POA: Diagnosis not present

## 2014-08-17 DIAGNOSIS — E1121 Type 2 diabetes mellitus with diabetic nephropathy: Secondary | ICD-10-CM | POA: Diagnosis not present

## 2014-08-17 DIAGNOSIS — J449 Chronic obstructive pulmonary disease, unspecified: Secondary | ICD-10-CM | POA: Diagnosis not present

## 2014-08-17 DIAGNOSIS — M159 Polyosteoarthritis, unspecified: Secondary | ICD-10-CM | POA: Diagnosis not present

## 2014-08-17 DIAGNOSIS — N186 End stage renal disease: Secondary | ICD-10-CM | POA: Diagnosis not present

## 2014-08-17 DIAGNOSIS — Z111 Encounter for screening for respiratory tuberculosis: Secondary | ICD-10-CM | POA: Diagnosis not present

## 2014-08-17 DIAGNOSIS — R197 Diarrhea, unspecified: Secondary | ICD-10-CM | POA: Diagnosis not present

## 2014-08-17 DIAGNOSIS — M545 Low back pain: Secondary | ICD-10-CM | POA: Diagnosis not present

## 2014-08-17 DIAGNOSIS — I10 Essential (primary) hypertension: Secondary | ICD-10-CM | POA: Diagnosis not present

## 2014-08-17 DIAGNOSIS — J309 Allergic rhinitis, unspecified: Secondary | ICD-10-CM | POA: Diagnosis not present

## 2014-08-17 DIAGNOSIS — R600 Localized edema: Secondary | ICD-10-CM | POA: Diagnosis not present

## 2014-08-17 DIAGNOSIS — E039 Hypothyroidism, unspecified: Secondary | ICD-10-CM | POA: Diagnosis not present

## 2014-08-17 DIAGNOSIS — Z992 Dependence on renal dialysis: Secondary | ICD-10-CM | POA: Diagnosis not present

## 2014-08-17 DIAGNOSIS — K219 Gastro-esophageal reflux disease without esophagitis: Secondary | ICD-10-CM | POA: Diagnosis not present

## 2014-08-18 DIAGNOSIS — N186 End stage renal disease: Secondary | ICD-10-CM | POA: Diagnosis not present

## 2014-08-18 DIAGNOSIS — Z992 Dependence on renal dialysis: Secondary | ICD-10-CM | POA: Diagnosis not present

## 2014-08-19 DIAGNOSIS — Z992 Dependence on renal dialysis: Secondary | ICD-10-CM | POA: Diagnosis not present

## 2014-08-19 DIAGNOSIS — N186 End stage renal disease: Secondary | ICD-10-CM | POA: Diagnosis not present

## 2014-08-20 DIAGNOSIS — Z992 Dependence on renal dialysis: Secondary | ICD-10-CM | POA: Diagnosis not present

## 2014-08-20 DIAGNOSIS — N186 End stage renal disease: Secondary | ICD-10-CM | POA: Diagnosis not present

## 2014-08-21 DIAGNOSIS — Z992 Dependence on renal dialysis: Secondary | ICD-10-CM | POA: Diagnosis not present

## 2014-08-21 DIAGNOSIS — N186 End stage renal disease: Secondary | ICD-10-CM | POA: Diagnosis not present

## 2014-08-22 DIAGNOSIS — N186 End stage renal disease: Secondary | ICD-10-CM | POA: Diagnosis not present

## 2014-08-22 DIAGNOSIS — Z992 Dependence on renal dialysis: Secondary | ICD-10-CM | POA: Diagnosis not present

## 2014-08-23 DIAGNOSIS — Z992 Dependence on renal dialysis: Secondary | ICD-10-CM | POA: Diagnosis not present

## 2014-08-23 DIAGNOSIS — N186 End stage renal disease: Secondary | ICD-10-CM | POA: Diagnosis not present

## 2014-08-24 DIAGNOSIS — Z992 Dependence on renal dialysis: Secondary | ICD-10-CM | POA: Diagnosis not present

## 2014-08-24 DIAGNOSIS — N186 End stage renal disease: Secondary | ICD-10-CM | POA: Diagnosis not present

## 2014-08-25 DIAGNOSIS — N186 End stage renal disease: Secondary | ICD-10-CM | POA: Diagnosis not present

## 2014-08-25 DIAGNOSIS — Z992 Dependence on renal dialysis: Secondary | ICD-10-CM | POA: Diagnosis not present

## 2014-08-26 DIAGNOSIS — N186 End stage renal disease: Secondary | ICD-10-CM | POA: Diagnosis not present

## 2014-08-26 DIAGNOSIS — Z992 Dependence on renal dialysis: Secondary | ICD-10-CM | POA: Diagnosis not present

## 2014-08-27 DIAGNOSIS — N186 End stage renal disease: Secondary | ICD-10-CM | POA: Diagnosis not present

## 2014-08-27 DIAGNOSIS — Z992 Dependence on renal dialysis: Secondary | ICD-10-CM | POA: Diagnosis not present

## 2014-08-28 DIAGNOSIS — Z992 Dependence on renal dialysis: Secondary | ICD-10-CM | POA: Diagnosis not present

## 2014-08-28 DIAGNOSIS — N186 End stage renal disease: Secondary | ICD-10-CM | POA: Diagnosis not present

## 2014-08-29 DIAGNOSIS — Z992 Dependence on renal dialysis: Secondary | ICD-10-CM | POA: Diagnosis not present

## 2014-08-29 DIAGNOSIS — N186 End stage renal disease: Secondary | ICD-10-CM | POA: Diagnosis not present

## 2014-08-30 DIAGNOSIS — Z992 Dependence on renal dialysis: Secondary | ICD-10-CM | POA: Diagnosis not present

## 2014-08-30 DIAGNOSIS — N186 End stage renal disease: Secondary | ICD-10-CM | POA: Diagnosis not present

## 2014-08-31 DIAGNOSIS — Z992 Dependence on renal dialysis: Secondary | ICD-10-CM | POA: Diagnosis not present

## 2014-08-31 DIAGNOSIS — N186 End stage renal disease: Secondary | ICD-10-CM | POA: Diagnosis not present

## 2014-09-01 DIAGNOSIS — N186 End stage renal disease: Secondary | ICD-10-CM | POA: Diagnosis not present

## 2014-09-01 DIAGNOSIS — Z992 Dependence on renal dialysis: Secondary | ICD-10-CM | POA: Diagnosis not present

## 2014-09-02 DIAGNOSIS — N186 End stage renal disease: Secondary | ICD-10-CM | POA: Diagnosis not present

## 2014-09-02 DIAGNOSIS — Z992 Dependence on renal dialysis: Secondary | ICD-10-CM | POA: Diagnosis not present

## 2014-09-03 DIAGNOSIS — N186 End stage renal disease: Secondary | ICD-10-CM | POA: Diagnosis not present

## 2014-09-03 DIAGNOSIS — Z992 Dependence on renal dialysis: Secondary | ICD-10-CM | POA: Diagnosis not present

## 2014-09-04 DIAGNOSIS — Z992 Dependence on renal dialysis: Secondary | ICD-10-CM | POA: Diagnosis not present

## 2014-09-04 DIAGNOSIS — N186 End stage renal disease: Secondary | ICD-10-CM | POA: Diagnosis not present

## 2014-09-05 DIAGNOSIS — Z992 Dependence on renal dialysis: Secondary | ICD-10-CM | POA: Diagnosis not present

## 2014-09-05 DIAGNOSIS — N186 End stage renal disease: Secondary | ICD-10-CM | POA: Diagnosis not present

## 2014-09-06 DIAGNOSIS — Z992 Dependence on renal dialysis: Secondary | ICD-10-CM | POA: Diagnosis not present

## 2014-09-06 DIAGNOSIS — N186 End stage renal disease: Secondary | ICD-10-CM | POA: Diagnosis not present

## 2014-09-07 DIAGNOSIS — Z992 Dependence on renal dialysis: Secondary | ICD-10-CM | POA: Diagnosis not present

## 2014-09-07 DIAGNOSIS — N033 Chronic nephritic syndrome with diffuse mesangial proliferative glomerulonephritis: Secondary | ICD-10-CM | POA: Diagnosis not present

## 2014-09-07 DIAGNOSIS — N186 End stage renal disease: Secondary | ICD-10-CM | POA: Diagnosis not present

## 2014-09-08 DIAGNOSIS — N186 End stage renal disease: Secondary | ICD-10-CM | POA: Diagnosis not present

## 2014-09-09 DIAGNOSIS — N186 End stage renal disease: Secondary | ICD-10-CM | POA: Diagnosis not present

## 2014-09-10 DIAGNOSIS — N186 End stage renal disease: Secondary | ICD-10-CM | POA: Diagnosis not present

## 2014-09-11 DIAGNOSIS — N186 End stage renal disease: Secondary | ICD-10-CM | POA: Diagnosis not present

## 2014-09-12 DIAGNOSIS — N186 End stage renal disease: Secondary | ICD-10-CM | POA: Diagnosis not present

## 2014-09-13 DIAGNOSIS — N186 End stage renal disease: Secondary | ICD-10-CM | POA: Diagnosis not present

## 2014-09-14 DIAGNOSIS — N186 End stage renal disease: Secondary | ICD-10-CM | POA: Diagnosis not present

## 2014-09-15 DIAGNOSIS — N186 End stage renal disease: Secondary | ICD-10-CM | POA: Diagnosis not present

## 2014-09-16 DIAGNOSIS — N186 End stage renal disease: Secondary | ICD-10-CM | POA: Diagnosis not present

## 2014-09-17 DIAGNOSIS — N186 End stage renal disease: Secondary | ICD-10-CM | POA: Diagnosis not present

## 2014-09-18 DIAGNOSIS — N186 End stage renal disease: Secondary | ICD-10-CM | POA: Diagnosis not present

## 2014-09-19 DIAGNOSIS — N186 End stage renal disease: Secondary | ICD-10-CM | POA: Diagnosis not present

## 2014-09-20 DIAGNOSIS — N186 End stage renal disease: Secondary | ICD-10-CM | POA: Diagnosis not present

## 2014-09-21 DIAGNOSIS — N186 End stage renal disease: Secondary | ICD-10-CM | POA: Diagnosis not present

## 2014-09-22 DIAGNOSIS — N186 End stage renal disease: Secondary | ICD-10-CM | POA: Diagnosis not present

## 2014-09-23 DIAGNOSIS — N186 End stage renal disease: Secondary | ICD-10-CM | POA: Diagnosis not present

## 2014-09-24 DIAGNOSIS — N186 End stage renal disease: Secondary | ICD-10-CM | POA: Diagnosis not present

## 2014-09-24 DIAGNOSIS — H26493 Other secondary cataract, bilateral: Secondary | ICD-10-CM | POA: Diagnosis not present

## 2014-09-25 DIAGNOSIS — N186 End stage renal disease: Secondary | ICD-10-CM | POA: Diagnosis not present

## 2014-09-26 DIAGNOSIS — N186 End stage renal disease: Secondary | ICD-10-CM | POA: Diagnosis not present

## 2014-09-27 DIAGNOSIS — N186 End stage renal disease: Secondary | ICD-10-CM | POA: Diagnosis not present

## 2014-09-28 DIAGNOSIS — N186 End stage renal disease: Secondary | ICD-10-CM | POA: Diagnosis not present

## 2014-09-29 DIAGNOSIS — N186 End stage renal disease: Secondary | ICD-10-CM | POA: Diagnosis not present

## 2014-09-30 DIAGNOSIS — N186 End stage renal disease: Secondary | ICD-10-CM | POA: Diagnosis not present

## 2014-10-01 DIAGNOSIS — N186 End stage renal disease: Secondary | ICD-10-CM | POA: Diagnosis not present

## 2014-10-02 DIAGNOSIS — N186 End stage renal disease: Secondary | ICD-10-CM | POA: Diagnosis not present

## 2014-10-03 DIAGNOSIS — N186 End stage renal disease: Secondary | ICD-10-CM | POA: Diagnosis not present

## 2014-10-04 DIAGNOSIS — N186 End stage renal disease: Secondary | ICD-10-CM | POA: Diagnosis not present

## 2014-10-05 ENCOUNTER — Ambulatory Visit (INDEPENDENT_AMBULATORY_CARE_PROVIDER_SITE_OTHER): Payer: Medicare Other

## 2014-10-05 VITALS — BP 167/70 | HR 75 | Resp 18

## 2014-10-05 DIAGNOSIS — L603 Nail dystrophy: Secondary | ICD-10-CM | POA: Diagnosis not present

## 2014-10-05 DIAGNOSIS — E1140 Type 2 diabetes mellitus with diabetic neuropathy, unspecified: Secondary | ICD-10-CM

## 2014-10-05 DIAGNOSIS — N19 Unspecified kidney failure: Secondary | ICD-10-CM

## 2014-10-05 DIAGNOSIS — N186 End stage renal disease: Secondary | ICD-10-CM | POA: Diagnosis not present

## 2014-10-05 NOTE — Progress Notes (Signed)
   Subjective:    Patient ID: Linda Jordan, female    DOB: 1930/05/01, 81 y.o.   MRN: [id number]  HPI I AM HERE TO GET MY TOENAILS TRIMMED UP    Review of Systems no new findings or systemic changes     Objective:   Physical Exam 81 year old female presents this time for mycotic in footcare patient does have nonspine diabetes is on renal dialysis diet-controlled diabetes. Does have neuropathy with history of burning and stinging paresthesia although diminished there is no ulcers no secondary infection nails thick brittle criptotic and incurvated tender both on palpation and with enclosed shoe wear and ambulation. No secondary infections no open wounds or ulcers are identified. Semirigid digital contractures are hammertoe/Croteau contractures of toes 2 through 5 bilateral.       Assessment & Plan:  Assessment this time his diabetes with history peripheral neuropathy diet-controlled diabetes there is dystrophic friable incurvated nails with nail dystrophy 1 through 5 bilateral these dystrophic nails are debrided return for future palliative care on an as-needed basis  Harriet Masson DPM

## 2014-10-06 DIAGNOSIS — N186 End stage renal disease: Secondary | ICD-10-CM | POA: Diagnosis not present

## 2014-10-07 DIAGNOSIS — N186 End stage renal disease: Secondary | ICD-10-CM | POA: Diagnosis not present

## 2014-10-08 DIAGNOSIS — N033 Chronic nephritic syndrome with diffuse mesangial proliferative glomerulonephritis: Secondary | ICD-10-CM | POA: Diagnosis not present

## 2014-10-08 DIAGNOSIS — N186 End stage renal disease: Secondary | ICD-10-CM | POA: Diagnosis not present

## 2014-10-08 DIAGNOSIS — Z992 Dependence on renal dialysis: Secondary | ICD-10-CM | POA: Diagnosis not present

## 2014-10-09 DIAGNOSIS — N186 End stage renal disease: Secondary | ICD-10-CM | POA: Diagnosis not present

## 2014-10-10 DIAGNOSIS — N186 End stage renal disease: Secondary | ICD-10-CM | POA: Diagnosis not present

## 2014-10-11 DIAGNOSIS — N186 End stage renal disease: Secondary | ICD-10-CM | POA: Diagnosis not present

## 2014-10-12 DIAGNOSIS — N186 End stage renal disease: Secondary | ICD-10-CM | POA: Diagnosis not present

## 2014-10-13 DIAGNOSIS — N186 End stage renal disease: Secondary | ICD-10-CM | POA: Diagnosis not present

## 2014-10-14 DIAGNOSIS — N186 End stage renal disease: Secondary | ICD-10-CM | POA: Diagnosis not present

## 2014-10-15 DIAGNOSIS — J309 Allergic rhinitis, unspecified: Secondary | ICD-10-CM | POA: Diagnosis not present

## 2014-10-15 DIAGNOSIS — J449 Chronic obstructive pulmonary disease, unspecified: Secondary | ICD-10-CM | POA: Diagnosis not present

## 2014-10-15 DIAGNOSIS — Z1389 Encounter for screening for other disorder: Secondary | ICD-10-CM | POA: Diagnosis not present

## 2014-10-15 DIAGNOSIS — E039 Hypothyroidism, unspecified: Secondary | ICD-10-CM | POA: Diagnosis not present

## 2014-10-15 DIAGNOSIS — K219 Gastro-esophageal reflux disease without esophagitis: Secondary | ICD-10-CM | POA: Diagnosis not present

## 2014-10-15 DIAGNOSIS — E1121 Type 2 diabetes mellitus with diabetic nephropathy: Secondary | ICD-10-CM | POA: Diagnosis not present

## 2014-10-15 DIAGNOSIS — I10 Essential (primary) hypertension: Secondary | ICD-10-CM | POA: Diagnosis not present

## 2014-10-15 DIAGNOSIS — N186 End stage renal disease: Secondary | ICD-10-CM | POA: Diagnosis not present

## 2014-10-15 DIAGNOSIS — Z9181 History of falling: Secondary | ICD-10-CM | POA: Diagnosis not present

## 2014-10-15 DIAGNOSIS — R197 Diarrhea, unspecified: Secondary | ICD-10-CM | POA: Diagnosis not present

## 2014-10-15 DIAGNOSIS — R600 Localized edema: Secondary | ICD-10-CM | POA: Diagnosis not present

## 2014-10-15 DIAGNOSIS — M159 Polyosteoarthritis, unspecified: Secondary | ICD-10-CM | POA: Diagnosis not present

## 2014-10-16 DIAGNOSIS — N186 End stage renal disease: Secondary | ICD-10-CM | POA: Diagnosis not present

## 2014-10-16 DIAGNOSIS — K659 Peritonitis, unspecified: Secondary | ICD-10-CM | POA: Diagnosis not present

## 2014-10-16 DIAGNOSIS — I120 Hypertensive chronic kidney disease with stage 5 chronic kidney disease or end stage renal disease: Secondary | ICD-10-CM | POA: Diagnosis not present

## 2014-10-16 DIAGNOSIS — R188 Other ascites: Secondary | ICD-10-CM | POA: Diagnosis not present

## 2014-10-16 DIAGNOSIS — A419 Sepsis, unspecified organism: Secondary | ICD-10-CM | POA: Diagnosis not present

## 2014-10-16 DIAGNOSIS — T8571XA Infection and inflammatory reaction due to peritoneal dialysis catheter, initial encounter: Secondary | ICD-10-CM | POA: Diagnosis not present

## 2014-10-17 DIAGNOSIS — I120 Hypertensive chronic kidney disease with stage 5 chronic kidney disease or end stage renal disease: Secondary | ICD-10-CM | POA: Diagnosis not present

## 2014-10-17 DIAGNOSIS — K838 Other specified diseases of biliary tract: Secondary | ICD-10-CM | POA: Diagnosis not present

## 2014-10-17 DIAGNOSIS — M1990 Unspecified osteoarthritis, unspecified site: Secondary | ICD-10-CM | POA: Diagnosis present

## 2014-10-17 DIAGNOSIS — B9561 Methicillin susceptible Staphylococcus aureus infection as the cause of diseases classified elsewhere: Secondary | ICD-10-CM | POA: Diagnosis not present

## 2014-10-17 DIAGNOSIS — Z992 Dependence on renal dialysis: Secondary | ICD-10-CM

## 2014-10-17 DIAGNOSIS — T8571XA Infection and inflammatory reaction due to peritoneal dialysis catheter, initial encounter: Secondary | ICD-10-CM | POA: Diagnosis present

## 2014-10-17 DIAGNOSIS — N186 End stage renal disease: Secondary | ICD-10-CM | POA: Insufficient documentation

## 2014-10-17 DIAGNOSIS — N281 Cyst of kidney, acquired: Secondary | ICD-10-CM | POA: Diagnosis not present

## 2014-10-17 DIAGNOSIS — R197 Diarrhea, unspecified: Secondary | ICD-10-CM | POA: Diagnosis not present

## 2014-10-17 DIAGNOSIS — K659 Peritonitis, unspecified: Secondary | ICD-10-CM | POA: Diagnosis present

## 2014-10-17 DIAGNOSIS — R0602 Shortness of breath: Secondary | ICD-10-CM | POA: Diagnosis not present

## 2014-10-17 DIAGNOSIS — I4949 Other premature depolarization: Secondary | ICD-10-CM | POA: Diagnosis not present

## 2014-10-17 DIAGNOSIS — R109 Unspecified abdominal pain: Secondary | ICD-10-CM | POA: Diagnosis not present

## 2014-10-17 DIAGNOSIS — E039 Hypothyroidism, unspecified: Secondary | ICD-10-CM | POA: Diagnosis present

## 2014-10-17 DIAGNOSIS — R188 Other ascites: Secondary | ICD-10-CM | POA: Diagnosis not present

## 2014-10-17 DIAGNOSIS — Z86718 Personal history of other venous thrombosis and embolism: Secondary | ICD-10-CM | POA: Diagnosis not present

## 2014-10-17 DIAGNOSIS — Z882 Allergy status to sulfonamides status: Secondary | ICD-10-CM | POA: Diagnosis not present

## 2014-10-17 DIAGNOSIS — A419 Sepsis, unspecified organism: Secondary | ICD-10-CM | POA: Diagnosis not present

## 2014-10-19 DIAGNOSIS — N186 End stage renal disease: Secondary | ICD-10-CM | POA: Diagnosis not present

## 2014-10-20 DIAGNOSIS — N186 End stage renal disease: Secondary | ICD-10-CM | POA: Diagnosis not present

## 2014-10-21 DIAGNOSIS — N186 End stage renal disease: Secondary | ICD-10-CM | POA: Diagnosis not present

## 2014-10-22 DIAGNOSIS — N186 End stage renal disease: Secondary | ICD-10-CM | POA: Diagnosis not present

## 2014-10-23 DIAGNOSIS — N186 End stage renal disease: Secondary | ICD-10-CM | POA: Diagnosis not present

## 2014-10-24 DIAGNOSIS — N186 End stage renal disease: Secondary | ICD-10-CM | POA: Diagnosis not present

## 2014-10-25 DIAGNOSIS — N186 End stage renal disease: Secondary | ICD-10-CM | POA: Diagnosis not present

## 2014-10-26 DIAGNOSIS — N186 End stage renal disease: Secondary | ICD-10-CM | POA: Diagnosis not present

## 2014-10-27 DIAGNOSIS — N186 End stage renal disease: Secondary | ICD-10-CM | POA: Diagnosis not present

## 2014-10-28 DIAGNOSIS — N186 End stage renal disease: Secondary | ICD-10-CM | POA: Diagnosis not present

## 2014-10-29 DIAGNOSIS — N186 End stage renal disease: Secondary | ICD-10-CM | POA: Diagnosis not present

## 2014-10-30 DIAGNOSIS — N186 End stage renal disease: Secondary | ICD-10-CM | POA: Diagnosis not present

## 2014-10-31 DIAGNOSIS — N186 End stage renal disease: Secondary | ICD-10-CM | POA: Diagnosis not present

## 2014-11-01 DIAGNOSIS — N186 End stage renal disease: Secondary | ICD-10-CM | POA: Diagnosis not present

## 2014-11-02 DIAGNOSIS — N186 End stage renal disease: Secondary | ICD-10-CM | POA: Diagnosis not present

## 2014-11-03 DIAGNOSIS — N186 End stage renal disease: Secondary | ICD-10-CM | POA: Diagnosis not present

## 2014-11-04 DIAGNOSIS — N186 End stage renal disease: Secondary | ICD-10-CM | POA: Diagnosis not present

## 2014-11-05 DIAGNOSIS — N186 End stage renal disease: Secondary | ICD-10-CM | POA: Diagnosis not present

## 2014-11-06 DIAGNOSIS — N186 End stage renal disease: Secondary | ICD-10-CM | POA: Diagnosis not present

## 2014-11-06 DIAGNOSIS — R880 Cloudy (hemodialysis) (peritoneal) dialysis effluent: Secondary | ICD-10-CM | POA: Diagnosis not present

## 2014-11-07 DIAGNOSIS — N186 End stage renal disease: Secondary | ICD-10-CM | POA: Diagnosis not present

## 2014-11-07 DIAGNOSIS — N033 Chronic nephritic syndrome with diffuse mesangial proliferative glomerulonephritis: Secondary | ICD-10-CM | POA: Diagnosis not present

## 2014-11-07 DIAGNOSIS — Z992 Dependence on renal dialysis: Secondary | ICD-10-CM | POA: Diagnosis not present

## 2014-11-08 DIAGNOSIS — N186 End stage renal disease: Secondary | ICD-10-CM | POA: Diagnosis not present

## 2014-11-08 DIAGNOSIS — R880 Cloudy (hemodialysis) (peritoneal) dialysis effluent: Secondary | ICD-10-CM | POA: Diagnosis not present

## 2014-11-08 DIAGNOSIS — D509 Iron deficiency anemia, unspecified: Secondary | ICD-10-CM | POA: Diagnosis not present

## 2014-11-09 DIAGNOSIS — R880 Cloudy (hemodialysis) (peritoneal) dialysis effluent: Secondary | ICD-10-CM | POA: Diagnosis not present

## 2014-11-09 DIAGNOSIS — N186 End stage renal disease: Secondary | ICD-10-CM | POA: Diagnosis not present

## 2014-11-09 DIAGNOSIS — D509 Iron deficiency anemia, unspecified: Secondary | ICD-10-CM | POA: Diagnosis not present

## 2014-11-10 DIAGNOSIS — R880 Cloudy (hemodialysis) (peritoneal) dialysis effluent: Secondary | ICD-10-CM | POA: Diagnosis not present

## 2014-11-10 DIAGNOSIS — N186 End stage renal disease: Secondary | ICD-10-CM | POA: Diagnosis not present

## 2014-11-10 DIAGNOSIS — D509 Iron deficiency anemia, unspecified: Secondary | ICD-10-CM | POA: Diagnosis not present

## 2014-11-11 DIAGNOSIS — D509 Iron deficiency anemia, unspecified: Secondary | ICD-10-CM | POA: Diagnosis not present

## 2014-11-11 DIAGNOSIS — R880 Cloudy (hemodialysis) (peritoneal) dialysis effluent: Secondary | ICD-10-CM | POA: Diagnosis not present

## 2014-11-11 DIAGNOSIS — N186 End stage renal disease: Secondary | ICD-10-CM | POA: Diagnosis not present

## 2014-11-12 DIAGNOSIS — N186 End stage renal disease: Secondary | ICD-10-CM | POA: Diagnosis not present

## 2014-11-12 DIAGNOSIS — D509 Iron deficiency anemia, unspecified: Secondary | ICD-10-CM | POA: Diagnosis not present

## 2014-11-12 DIAGNOSIS — R880 Cloudy (hemodialysis) (peritoneal) dialysis effluent: Secondary | ICD-10-CM | POA: Diagnosis not present

## 2014-11-13 DIAGNOSIS — D509 Iron deficiency anemia, unspecified: Secondary | ICD-10-CM | POA: Diagnosis not present

## 2014-11-13 DIAGNOSIS — R880 Cloudy (hemodialysis) (peritoneal) dialysis effluent: Secondary | ICD-10-CM | POA: Diagnosis not present

## 2014-11-13 DIAGNOSIS — N186 End stage renal disease: Secondary | ICD-10-CM | POA: Diagnosis not present

## 2014-11-14 DIAGNOSIS — R880 Cloudy (hemodialysis) (peritoneal) dialysis effluent: Secondary | ICD-10-CM | POA: Diagnosis not present

## 2014-11-14 DIAGNOSIS — D509 Iron deficiency anemia, unspecified: Secondary | ICD-10-CM | POA: Diagnosis not present

## 2014-11-14 DIAGNOSIS — N186 End stage renal disease: Secondary | ICD-10-CM | POA: Diagnosis not present

## 2014-11-15 DIAGNOSIS — D509 Iron deficiency anemia, unspecified: Secondary | ICD-10-CM | POA: Diagnosis not present

## 2014-11-15 DIAGNOSIS — N186 End stage renal disease: Secondary | ICD-10-CM | POA: Diagnosis not present

## 2014-11-15 DIAGNOSIS — R880 Cloudy (hemodialysis) (peritoneal) dialysis effluent: Secondary | ICD-10-CM | POA: Diagnosis not present

## 2014-11-16 DIAGNOSIS — R880 Cloudy (hemodialysis) (peritoneal) dialysis effluent: Secondary | ICD-10-CM | POA: Diagnosis not present

## 2014-11-16 DIAGNOSIS — N186 End stage renal disease: Secondary | ICD-10-CM | POA: Diagnosis not present

## 2014-11-16 DIAGNOSIS — D509 Iron deficiency anemia, unspecified: Secondary | ICD-10-CM | POA: Diagnosis not present

## 2014-11-17 DIAGNOSIS — D509 Iron deficiency anemia, unspecified: Secondary | ICD-10-CM | POA: Diagnosis not present

## 2014-11-17 DIAGNOSIS — N186 End stage renal disease: Secondary | ICD-10-CM | POA: Diagnosis not present

## 2014-11-17 DIAGNOSIS — R880 Cloudy (hemodialysis) (peritoneal) dialysis effluent: Secondary | ICD-10-CM | POA: Diagnosis not present

## 2014-11-18 DIAGNOSIS — D509 Iron deficiency anemia, unspecified: Secondary | ICD-10-CM | POA: Diagnosis not present

## 2014-11-18 DIAGNOSIS — N186 End stage renal disease: Secondary | ICD-10-CM | POA: Diagnosis not present

## 2014-11-18 DIAGNOSIS — R880 Cloudy (hemodialysis) (peritoneal) dialysis effluent: Secondary | ICD-10-CM | POA: Diagnosis not present

## 2014-11-19 DIAGNOSIS — R880 Cloudy (hemodialysis) (peritoneal) dialysis effluent: Secondary | ICD-10-CM | POA: Diagnosis not present

## 2014-11-19 DIAGNOSIS — D509 Iron deficiency anemia, unspecified: Secondary | ICD-10-CM | POA: Diagnosis not present

## 2014-11-19 DIAGNOSIS — N186 End stage renal disease: Secondary | ICD-10-CM | POA: Diagnosis not present

## 2014-11-20 DIAGNOSIS — R880 Cloudy (hemodialysis) (peritoneal) dialysis effluent: Secondary | ICD-10-CM | POA: Diagnosis not present

## 2014-11-20 DIAGNOSIS — N186 End stage renal disease: Secondary | ICD-10-CM | POA: Diagnosis not present

## 2014-11-20 DIAGNOSIS — D509 Iron deficiency anemia, unspecified: Secondary | ICD-10-CM | POA: Diagnosis not present

## 2014-11-21 DIAGNOSIS — N186 End stage renal disease: Secondary | ICD-10-CM | POA: Diagnosis not present

## 2014-11-21 DIAGNOSIS — R880 Cloudy (hemodialysis) (peritoneal) dialysis effluent: Secondary | ICD-10-CM | POA: Diagnosis not present

## 2014-11-21 DIAGNOSIS — D509 Iron deficiency anemia, unspecified: Secondary | ICD-10-CM | POA: Diagnosis not present

## 2014-11-22 DIAGNOSIS — R880 Cloudy (hemodialysis) (peritoneal) dialysis effluent: Secondary | ICD-10-CM | POA: Diagnosis not present

## 2014-11-22 DIAGNOSIS — D509 Iron deficiency anemia, unspecified: Secondary | ICD-10-CM | POA: Diagnosis not present

## 2014-11-22 DIAGNOSIS — N186 End stage renal disease: Secondary | ICD-10-CM | POA: Diagnosis not present

## 2014-11-23 DIAGNOSIS — E785 Hyperlipidemia, unspecified: Secondary | ICD-10-CM | POA: Diagnosis not present

## 2014-11-23 DIAGNOSIS — K219 Gastro-esophageal reflux disease without esophagitis: Secondary | ICD-10-CM | POA: Diagnosis not present

## 2014-11-23 DIAGNOSIS — I82409 Acute embolism and thrombosis of unspecified deep veins of unspecified lower extremity: Secondary | ICD-10-CM | POA: Diagnosis not present

## 2014-11-23 DIAGNOSIS — J309 Allergic rhinitis, unspecified: Secondary | ICD-10-CM | POA: Diagnosis not present

## 2014-11-23 DIAGNOSIS — E1121 Type 2 diabetes mellitus with diabetic nephropathy: Secondary | ICD-10-CM | POA: Diagnosis not present

## 2014-11-23 DIAGNOSIS — B0229 Other postherpetic nervous system involvement: Secondary | ICD-10-CM | POA: Diagnosis not present

## 2014-11-23 DIAGNOSIS — Z1389 Encounter for screening for other disorder: Secondary | ICD-10-CM | POA: Diagnosis not present

## 2014-11-23 DIAGNOSIS — D509 Iron deficiency anemia, unspecified: Secondary | ICD-10-CM | POA: Diagnosis not present

## 2014-11-23 DIAGNOSIS — R600 Localized edema: Secondary | ICD-10-CM | POA: Diagnosis not present

## 2014-11-23 DIAGNOSIS — E039 Hypothyroidism, unspecified: Secondary | ICD-10-CM | POA: Diagnosis not present

## 2014-11-23 DIAGNOSIS — N186 End stage renal disease: Secondary | ICD-10-CM | POA: Diagnosis not present

## 2014-11-23 DIAGNOSIS — J449 Chronic obstructive pulmonary disease, unspecified: Secondary | ICD-10-CM | POA: Diagnosis not present

## 2014-11-23 DIAGNOSIS — I10 Essential (primary) hypertension: Secondary | ICD-10-CM | POA: Diagnosis not present

## 2014-11-23 DIAGNOSIS — M545 Low back pain: Secondary | ICD-10-CM | POA: Diagnosis not present

## 2014-11-23 DIAGNOSIS — R880 Cloudy (hemodialysis) (peritoneal) dialysis effluent: Secondary | ICD-10-CM | POA: Diagnosis not present

## 2014-11-24 DIAGNOSIS — R880 Cloudy (hemodialysis) (peritoneal) dialysis effluent: Secondary | ICD-10-CM | POA: Diagnosis not present

## 2014-11-24 DIAGNOSIS — N186 End stage renal disease: Secondary | ICD-10-CM | POA: Diagnosis not present

## 2014-11-24 DIAGNOSIS — D509 Iron deficiency anemia, unspecified: Secondary | ICD-10-CM | POA: Diagnosis not present

## 2014-11-25 DIAGNOSIS — R880 Cloudy (hemodialysis) (peritoneal) dialysis effluent: Secondary | ICD-10-CM | POA: Diagnosis not present

## 2014-11-25 DIAGNOSIS — N186 End stage renal disease: Secondary | ICD-10-CM | POA: Diagnosis not present

## 2014-11-25 DIAGNOSIS — D509 Iron deficiency anemia, unspecified: Secondary | ICD-10-CM | POA: Diagnosis not present

## 2014-11-26 DIAGNOSIS — R880 Cloudy (hemodialysis) (peritoneal) dialysis effluent: Secondary | ICD-10-CM | POA: Diagnosis not present

## 2014-11-26 DIAGNOSIS — N186 End stage renal disease: Secondary | ICD-10-CM | POA: Diagnosis not present

## 2014-11-26 DIAGNOSIS — D509 Iron deficiency anemia, unspecified: Secondary | ICD-10-CM | POA: Diagnosis not present

## 2014-11-27 DIAGNOSIS — N186 End stage renal disease: Secondary | ICD-10-CM | POA: Diagnosis not present

## 2014-11-27 DIAGNOSIS — D509 Iron deficiency anemia, unspecified: Secondary | ICD-10-CM | POA: Diagnosis not present

## 2014-11-27 DIAGNOSIS — R880 Cloudy (hemodialysis) (peritoneal) dialysis effluent: Secondary | ICD-10-CM | POA: Diagnosis not present

## 2014-11-28 DIAGNOSIS — R880 Cloudy (hemodialysis) (peritoneal) dialysis effluent: Secondary | ICD-10-CM | POA: Diagnosis not present

## 2014-11-28 DIAGNOSIS — N186 End stage renal disease: Secondary | ICD-10-CM | POA: Diagnosis not present

## 2014-11-28 DIAGNOSIS — D509 Iron deficiency anemia, unspecified: Secondary | ICD-10-CM | POA: Diagnosis not present

## 2014-11-29 DIAGNOSIS — R880 Cloudy (hemodialysis) (peritoneal) dialysis effluent: Secondary | ICD-10-CM | POA: Diagnosis not present

## 2014-11-29 DIAGNOSIS — N186 End stage renal disease: Secondary | ICD-10-CM | POA: Diagnosis not present

## 2014-11-29 DIAGNOSIS — D509 Iron deficiency anemia, unspecified: Secondary | ICD-10-CM | POA: Diagnosis not present

## 2014-11-30 DIAGNOSIS — N186 End stage renal disease: Secondary | ICD-10-CM | POA: Diagnosis not present

## 2014-11-30 DIAGNOSIS — D509 Iron deficiency anemia, unspecified: Secondary | ICD-10-CM | POA: Diagnosis not present

## 2014-11-30 DIAGNOSIS — R880 Cloudy (hemodialysis) (peritoneal) dialysis effluent: Secondary | ICD-10-CM | POA: Diagnosis not present

## 2014-12-01 DIAGNOSIS — N186 End stage renal disease: Secondary | ICD-10-CM | POA: Diagnosis not present

## 2014-12-01 DIAGNOSIS — R880 Cloudy (hemodialysis) (peritoneal) dialysis effluent: Secondary | ICD-10-CM | POA: Diagnosis not present

## 2014-12-01 DIAGNOSIS — D509 Iron deficiency anemia, unspecified: Secondary | ICD-10-CM | POA: Diagnosis not present

## 2014-12-02 DIAGNOSIS — D509 Iron deficiency anemia, unspecified: Secondary | ICD-10-CM | POA: Diagnosis not present

## 2014-12-02 DIAGNOSIS — R880 Cloudy (hemodialysis) (peritoneal) dialysis effluent: Secondary | ICD-10-CM | POA: Diagnosis not present

## 2014-12-02 DIAGNOSIS — N186 End stage renal disease: Secondary | ICD-10-CM | POA: Diagnosis not present

## 2014-12-03 DIAGNOSIS — D509 Iron deficiency anemia, unspecified: Secondary | ICD-10-CM | POA: Diagnosis not present

## 2014-12-03 DIAGNOSIS — N186 End stage renal disease: Secondary | ICD-10-CM | POA: Diagnosis not present

## 2014-12-03 DIAGNOSIS — R880 Cloudy (hemodialysis) (peritoneal) dialysis effluent: Secondary | ICD-10-CM | POA: Diagnosis not present

## 2014-12-04 DIAGNOSIS — D509 Iron deficiency anemia, unspecified: Secondary | ICD-10-CM | POA: Diagnosis not present

## 2014-12-04 DIAGNOSIS — R880 Cloudy (hemodialysis) (peritoneal) dialysis effluent: Secondary | ICD-10-CM | POA: Diagnosis not present

## 2014-12-04 DIAGNOSIS — N186 End stage renal disease: Secondary | ICD-10-CM | POA: Diagnosis not present

## 2014-12-05 DIAGNOSIS — N186 End stage renal disease: Secondary | ICD-10-CM | POA: Diagnosis not present

## 2014-12-05 DIAGNOSIS — R880 Cloudy (hemodialysis) (peritoneal) dialysis effluent: Secondary | ICD-10-CM | POA: Diagnosis not present

## 2014-12-05 DIAGNOSIS — D509 Iron deficiency anemia, unspecified: Secondary | ICD-10-CM | POA: Diagnosis not present

## 2014-12-06 DIAGNOSIS — R880 Cloudy (hemodialysis) (peritoneal) dialysis effluent: Secondary | ICD-10-CM | POA: Diagnosis not present

## 2014-12-06 DIAGNOSIS — N186 End stage renal disease: Secondary | ICD-10-CM | POA: Diagnosis not present

## 2014-12-06 DIAGNOSIS — D509 Iron deficiency anemia, unspecified: Secondary | ICD-10-CM | POA: Diagnosis not present

## 2014-12-07 DIAGNOSIS — D509 Iron deficiency anemia, unspecified: Secondary | ICD-10-CM | POA: Diagnosis not present

## 2014-12-07 DIAGNOSIS — N186 End stage renal disease: Secondary | ICD-10-CM | POA: Diagnosis not present

## 2014-12-07 DIAGNOSIS — R880 Cloudy (hemodialysis) (peritoneal) dialysis effluent: Secondary | ICD-10-CM | POA: Diagnosis not present

## 2014-12-08 DIAGNOSIS — D509 Iron deficiency anemia, unspecified: Secondary | ICD-10-CM | POA: Diagnosis not present

## 2014-12-08 DIAGNOSIS — N033 Chronic nephritic syndrome with diffuse mesangial proliferative glomerulonephritis: Secondary | ICD-10-CM | POA: Diagnosis not present

## 2014-12-08 DIAGNOSIS — R880 Cloudy (hemodialysis) (peritoneal) dialysis effluent: Secondary | ICD-10-CM | POA: Diagnosis not present

## 2014-12-08 DIAGNOSIS — Z992 Dependence on renal dialysis: Secondary | ICD-10-CM | POA: Diagnosis not present

## 2014-12-08 DIAGNOSIS — N186 End stage renal disease: Secondary | ICD-10-CM | POA: Diagnosis not present

## 2014-12-09 DIAGNOSIS — N186 End stage renal disease: Secondary | ICD-10-CM | POA: Diagnosis not present

## 2014-12-10 DIAGNOSIS — N186 End stage renal disease: Secondary | ICD-10-CM | POA: Diagnosis not present

## 2014-12-11 DIAGNOSIS — N186 End stage renal disease: Secondary | ICD-10-CM | POA: Diagnosis not present

## 2014-12-12 DIAGNOSIS — N186 End stage renal disease: Secondary | ICD-10-CM | POA: Diagnosis not present

## 2014-12-13 DIAGNOSIS — N186 End stage renal disease: Secondary | ICD-10-CM | POA: Diagnosis not present

## 2014-12-14 DIAGNOSIS — N186 End stage renal disease: Secondary | ICD-10-CM | POA: Diagnosis not present

## 2014-12-15 DIAGNOSIS — N186 End stage renal disease: Secondary | ICD-10-CM | POA: Diagnosis not present

## 2014-12-16 DIAGNOSIS — N186 End stage renal disease: Secondary | ICD-10-CM | POA: Diagnosis not present

## 2014-12-17 DIAGNOSIS — N186 End stage renal disease: Secondary | ICD-10-CM | POA: Diagnosis not present

## 2014-12-18 DIAGNOSIS — N186 End stage renal disease: Secondary | ICD-10-CM | POA: Diagnosis not present

## 2014-12-19 DIAGNOSIS — N186 End stage renal disease: Secondary | ICD-10-CM | POA: Diagnosis not present

## 2014-12-20 DIAGNOSIS — N186 End stage renal disease: Secondary | ICD-10-CM | POA: Diagnosis not present

## 2014-12-21 DIAGNOSIS — N186 End stage renal disease: Secondary | ICD-10-CM | POA: Diagnosis not present

## 2014-12-22 DIAGNOSIS — N186 End stage renal disease: Secondary | ICD-10-CM | POA: Diagnosis not present

## 2014-12-23 DIAGNOSIS — N186 End stage renal disease: Secondary | ICD-10-CM | POA: Diagnosis not present

## 2014-12-24 DIAGNOSIS — I82409 Acute embolism and thrombosis of unspecified deep veins of unspecified lower extremity: Secondary | ICD-10-CM | POA: Diagnosis not present

## 2014-12-24 DIAGNOSIS — M159 Polyosteoarthritis, unspecified: Secondary | ICD-10-CM | POA: Diagnosis not present

## 2014-12-24 DIAGNOSIS — E1121 Type 2 diabetes mellitus with diabetic nephropathy: Secondary | ICD-10-CM | POA: Diagnosis not present

## 2014-12-24 DIAGNOSIS — K219 Gastro-esophageal reflux disease without esophagitis: Secondary | ICD-10-CM | POA: Diagnosis not present

## 2014-12-24 DIAGNOSIS — J449 Chronic obstructive pulmonary disease, unspecified: Secondary | ICD-10-CM | POA: Diagnosis not present

## 2014-12-24 DIAGNOSIS — N186 End stage renal disease: Secondary | ICD-10-CM | POA: Diagnosis not present

## 2014-12-24 DIAGNOSIS — E039 Hypothyroidism, unspecified: Secondary | ICD-10-CM | POA: Diagnosis not present

## 2014-12-24 DIAGNOSIS — R600 Localized edema: Secondary | ICD-10-CM | POA: Diagnosis not present

## 2014-12-24 DIAGNOSIS — B0229 Other postherpetic nervous system involvement: Secondary | ICD-10-CM | POA: Diagnosis not present

## 2014-12-24 DIAGNOSIS — I10 Essential (primary) hypertension: Secondary | ICD-10-CM | POA: Diagnosis not present

## 2014-12-24 DIAGNOSIS — J309 Allergic rhinitis, unspecified: Secondary | ICD-10-CM | POA: Diagnosis not present

## 2014-12-24 DIAGNOSIS — M545 Low back pain: Secondary | ICD-10-CM | POA: Diagnosis not present

## 2014-12-25 DIAGNOSIS — N186 End stage renal disease: Secondary | ICD-10-CM | POA: Diagnosis not present

## 2014-12-26 DIAGNOSIS — N186 End stage renal disease: Secondary | ICD-10-CM | POA: Diagnosis not present

## 2014-12-27 DIAGNOSIS — N186 End stage renal disease: Secondary | ICD-10-CM | POA: Diagnosis not present

## 2014-12-28 DIAGNOSIS — N186 End stage renal disease: Secondary | ICD-10-CM | POA: Diagnosis not present

## 2014-12-29 DIAGNOSIS — N186 End stage renal disease: Secondary | ICD-10-CM | POA: Diagnosis not present

## 2014-12-30 DIAGNOSIS — N186 End stage renal disease: Secondary | ICD-10-CM | POA: Diagnosis not present

## 2014-12-31 DIAGNOSIS — N186 End stage renal disease: Secondary | ICD-10-CM | POA: Diagnosis not present

## 2015-01-01 DIAGNOSIS — N186 End stage renal disease: Secondary | ICD-10-CM | POA: Diagnosis not present

## 2015-01-02 DIAGNOSIS — N186 End stage renal disease: Secondary | ICD-10-CM | POA: Diagnosis not present

## 2015-01-03 DIAGNOSIS — N186 End stage renal disease: Secondary | ICD-10-CM | POA: Diagnosis not present

## 2015-01-04 ENCOUNTER — Ambulatory Visit: Payer: Medicare Other

## 2015-01-04 DIAGNOSIS — N186 End stage renal disease: Secondary | ICD-10-CM | POA: Diagnosis not present

## 2015-01-05 DIAGNOSIS — N186 End stage renal disease: Secondary | ICD-10-CM | POA: Diagnosis not present

## 2015-01-06 ENCOUNTER — Ambulatory Visit (INDEPENDENT_AMBULATORY_CARE_PROVIDER_SITE_OTHER): Payer: Medicare Other | Admitting: Podiatry

## 2015-01-06 ENCOUNTER — Encounter: Payer: Self-pay | Admitting: Podiatry

## 2015-01-06 VITALS — BP 172/90 | HR 79 | Resp 18

## 2015-01-06 DIAGNOSIS — E1140 Type 2 diabetes mellitus with diabetic neuropathy, unspecified: Secondary | ICD-10-CM

## 2015-01-06 DIAGNOSIS — M79675 Pain in left toe(s): Secondary | ICD-10-CM | POA: Diagnosis not present

## 2015-01-06 DIAGNOSIS — M79674 Pain in right toe(s): Secondary | ICD-10-CM

## 2015-01-06 DIAGNOSIS — B351 Tinea unguium: Secondary | ICD-10-CM | POA: Diagnosis not present

## 2015-01-06 DIAGNOSIS — M79676 Pain in unspecified toe(s): Secondary | ICD-10-CM

## 2015-01-06 DIAGNOSIS — N186 End stage renal disease: Secondary | ICD-10-CM | POA: Diagnosis not present

## 2015-01-06 NOTE — Progress Notes (Signed)
Patient ID: Linda Jordan, female   DOB: 1930/03/16, 81 y.o.   MRN: [id number] Complaint:  Visit Type: Patient returns to my office for continued preventative foot care services. Complaint: Patient states" my nails have grown long and thick and become painful to walk and wear shoes" Patient has been diagnosed with DM with no complications. He presents for preventative foot care services. No changes to ROS  Podiatric Exam: Vascular: dorsalis pedis and posterior tibial pulses are palpable bilateral. Capillary return is immediate. Temperature gradient is WNL. Skin turgor WNL  Sensorium: Normal Semmes Weinstein monofilament test. Normal tactile sensation bilaterally. Nail Exam: Pt has thick disfigured discolored nails with subungual debris noted bilateral entire nail hallux through fifth toenails Ulcer Exam: There is no evidence of ulcer or pre-ulcerative changes or infection. Orthopedic Exam: Muscle tone and strength are WNL. No limitations in general ROM. No crepitus or effusions noted. Foot type and digits show no abnormalities. Bony prominences are unremarkable. Toe contractures.  No pain. Skin: No Porokeratosis. No infection or ulcers  Diagnosis:  Tinea unguium, Pain in right toe, pain in left toes  Treatment & Plan Procedures and Treatment: Consent by patient was obtained for treatment procedures. The patient understood the discussion of treatment and procedures well. All questions were answered thoroughly reviewed. Debridement of mycotic and hypertrophic toenails, 1 through 5 bilateral and clearing of subungual debris. No ulceration, no infection noted.  Return Visit-Office Procedure: Patient instructed to return to the office for a follow up visit 3 months for continued evaluation and treatment.

## 2015-01-07 DIAGNOSIS — N033 Chronic nephritic syndrome with diffuse mesangial proliferative glomerulonephritis: Secondary | ICD-10-CM | POA: Diagnosis not present

## 2015-01-07 DIAGNOSIS — Z992 Dependence on renal dialysis: Secondary | ICD-10-CM | POA: Diagnosis not present

## 2015-01-07 DIAGNOSIS — N186 End stage renal disease: Secondary | ICD-10-CM | POA: Diagnosis not present

## 2015-01-08 DIAGNOSIS — N186 End stage renal disease: Secondary | ICD-10-CM | POA: Diagnosis not present

## 2015-01-09 DIAGNOSIS — N186 End stage renal disease: Secondary | ICD-10-CM | POA: Diagnosis not present

## 2015-01-10 DIAGNOSIS — N186 End stage renal disease: Secondary | ICD-10-CM | POA: Diagnosis not present

## 2015-01-11 DIAGNOSIS — N186 End stage renal disease: Secondary | ICD-10-CM | POA: Diagnosis not present

## 2015-01-12 DIAGNOSIS — N186 End stage renal disease: Secondary | ICD-10-CM | POA: Diagnosis not present

## 2015-01-13 DIAGNOSIS — N186 End stage renal disease: Secondary | ICD-10-CM | POA: Diagnosis not present

## 2015-01-14 DIAGNOSIS — N186 End stage renal disease: Secondary | ICD-10-CM | POA: Diagnosis not present

## 2015-01-15 DIAGNOSIS — N186 End stage renal disease: Secondary | ICD-10-CM | POA: Diagnosis not present

## 2015-01-16 DIAGNOSIS — N186 End stage renal disease: Secondary | ICD-10-CM | POA: Diagnosis not present

## 2015-01-17 DIAGNOSIS — N186 End stage renal disease: Secondary | ICD-10-CM | POA: Diagnosis not present

## 2015-01-18 DIAGNOSIS — N186 End stage renal disease: Secondary | ICD-10-CM | POA: Diagnosis not present

## 2015-01-19 DIAGNOSIS — N186 End stage renal disease: Secondary | ICD-10-CM | POA: Diagnosis not present

## 2015-01-20 DIAGNOSIS — N186 End stage renal disease: Secondary | ICD-10-CM | POA: Diagnosis not present

## 2015-01-21 DIAGNOSIS — N186 End stage renal disease: Secondary | ICD-10-CM | POA: Diagnosis not present

## 2015-01-22 DIAGNOSIS — N186 End stage renal disease: Secondary | ICD-10-CM | POA: Diagnosis not present

## 2015-01-23 DIAGNOSIS — N186 End stage renal disease: Secondary | ICD-10-CM | POA: Diagnosis not present

## 2015-01-24 DIAGNOSIS — N186 End stage renal disease: Secondary | ICD-10-CM | POA: Diagnosis not present

## 2015-01-25 DIAGNOSIS — N186 End stage renal disease: Secondary | ICD-10-CM | POA: Diagnosis not present

## 2015-01-26 DIAGNOSIS — N186 End stage renal disease: Secondary | ICD-10-CM | POA: Diagnosis not present

## 2015-01-27 DIAGNOSIS — N186 End stage renal disease: Secondary | ICD-10-CM | POA: Diagnosis not present

## 2015-01-28 DIAGNOSIS — N186 End stage renal disease: Secondary | ICD-10-CM | POA: Diagnosis not present

## 2015-01-29 DIAGNOSIS — N186 End stage renal disease: Secondary | ICD-10-CM | POA: Diagnosis not present

## 2015-01-30 DIAGNOSIS — N186 End stage renal disease: Secondary | ICD-10-CM | POA: Diagnosis not present

## 2015-01-31 DIAGNOSIS — N186 End stage renal disease: Secondary | ICD-10-CM | POA: Diagnosis not present

## 2015-02-01 DIAGNOSIS — N186 End stage renal disease: Secondary | ICD-10-CM | POA: Diagnosis not present

## 2015-02-02 DIAGNOSIS — N186 End stage renal disease: Secondary | ICD-10-CM | POA: Diagnosis not present

## 2015-02-03 DIAGNOSIS — N186 End stage renal disease: Secondary | ICD-10-CM | POA: Diagnosis not present

## 2015-02-04 DIAGNOSIS — N186 End stage renal disease: Secondary | ICD-10-CM | POA: Diagnosis not present

## 2015-02-05 DIAGNOSIS — N186 End stage renal disease: Secondary | ICD-10-CM | POA: Diagnosis not present

## 2015-02-06 DIAGNOSIS — N186 End stage renal disease: Secondary | ICD-10-CM | POA: Diagnosis not present

## 2015-02-07 DIAGNOSIS — Z992 Dependence on renal dialysis: Secondary | ICD-10-CM | POA: Diagnosis not present

## 2015-02-07 DIAGNOSIS — N033 Chronic nephritic syndrome with diffuse mesangial proliferative glomerulonephritis: Secondary | ICD-10-CM | POA: Diagnosis not present

## 2015-02-07 DIAGNOSIS — N186 End stage renal disease: Secondary | ICD-10-CM | POA: Diagnosis not present

## 2015-02-08 DIAGNOSIS — N186 End stage renal disease: Secondary | ICD-10-CM | POA: Diagnosis not present

## 2015-02-09 DIAGNOSIS — N186 End stage renal disease: Secondary | ICD-10-CM | POA: Diagnosis not present

## 2015-02-10 DIAGNOSIS — N186 End stage renal disease: Secondary | ICD-10-CM | POA: Diagnosis not present

## 2015-02-11 DIAGNOSIS — N186 End stage renal disease: Secondary | ICD-10-CM | POA: Diagnosis not present

## 2015-02-12 DIAGNOSIS — N186 End stage renal disease: Secondary | ICD-10-CM | POA: Diagnosis not present

## 2015-02-13 DIAGNOSIS — N186 End stage renal disease: Secondary | ICD-10-CM | POA: Diagnosis not present

## 2015-02-14 DIAGNOSIS — N186 End stage renal disease: Secondary | ICD-10-CM | POA: Diagnosis not present

## 2015-02-15 DIAGNOSIS — N186 End stage renal disease: Secondary | ICD-10-CM | POA: Diagnosis not present

## 2015-02-16 DIAGNOSIS — M9901 Segmental and somatic dysfunction of cervical region: Secondary | ICD-10-CM | POA: Diagnosis not present

## 2015-02-16 DIAGNOSIS — N186 End stage renal disease: Secondary | ICD-10-CM | POA: Diagnosis not present

## 2015-02-16 DIAGNOSIS — M9902 Segmental and somatic dysfunction of thoracic region: Secondary | ICD-10-CM | POA: Diagnosis not present

## 2015-02-16 DIAGNOSIS — M542 Cervicalgia: Secondary | ICD-10-CM | POA: Diagnosis not present

## 2015-02-17 DIAGNOSIS — N186 End stage renal disease: Secondary | ICD-10-CM | POA: Diagnosis not present

## 2015-02-18 DIAGNOSIS — M9902 Segmental and somatic dysfunction of thoracic region: Secondary | ICD-10-CM | POA: Diagnosis not present

## 2015-02-18 DIAGNOSIS — N186 End stage renal disease: Secondary | ICD-10-CM | POA: Diagnosis not present

## 2015-02-18 DIAGNOSIS — M542 Cervicalgia: Secondary | ICD-10-CM | POA: Diagnosis not present

## 2015-02-18 DIAGNOSIS — M9901 Segmental and somatic dysfunction of cervical region: Secondary | ICD-10-CM | POA: Diagnosis not present

## 2015-02-19 DIAGNOSIS — N186 End stage renal disease: Secondary | ICD-10-CM | POA: Diagnosis not present

## 2015-02-20 DIAGNOSIS — N186 End stage renal disease: Secondary | ICD-10-CM | POA: Diagnosis not present

## 2015-02-21 DIAGNOSIS — N186 End stage renal disease: Secondary | ICD-10-CM | POA: Diagnosis not present

## 2015-02-22 DIAGNOSIS — M159 Polyosteoarthritis, unspecified: Secondary | ICD-10-CM | POA: Diagnosis not present

## 2015-02-22 DIAGNOSIS — J449 Chronic obstructive pulmonary disease, unspecified: Secondary | ICD-10-CM | POA: Diagnosis not present

## 2015-02-22 DIAGNOSIS — R600 Localized edema: Secondary | ICD-10-CM | POA: Diagnosis not present

## 2015-02-22 DIAGNOSIS — K219 Gastro-esophageal reflux disease without esophagitis: Secondary | ICD-10-CM | POA: Diagnosis not present

## 2015-02-22 DIAGNOSIS — I10 Essential (primary) hypertension: Secondary | ICD-10-CM | POA: Diagnosis not present

## 2015-02-22 DIAGNOSIS — E039 Hypothyroidism, unspecified: Secondary | ICD-10-CM | POA: Diagnosis not present

## 2015-02-22 DIAGNOSIS — B0229 Other postherpetic nervous system involvement: Secondary | ICD-10-CM | POA: Diagnosis not present

## 2015-02-22 DIAGNOSIS — M545 Low back pain: Secondary | ICD-10-CM | POA: Diagnosis not present

## 2015-02-22 DIAGNOSIS — E1121 Type 2 diabetes mellitus with diabetic nephropathy: Secondary | ICD-10-CM | POA: Diagnosis not present

## 2015-02-22 DIAGNOSIS — N186 End stage renal disease: Secondary | ICD-10-CM | POA: Diagnosis not present

## 2015-02-22 DIAGNOSIS — J309 Allergic rhinitis, unspecified: Secondary | ICD-10-CM | POA: Diagnosis not present

## 2015-02-22 DIAGNOSIS — I82409 Acute embolism and thrombosis of unspecified deep veins of unspecified lower extremity: Secondary | ICD-10-CM | POA: Diagnosis not present

## 2015-02-23 DIAGNOSIS — N186 End stage renal disease: Secondary | ICD-10-CM | POA: Diagnosis not present

## 2015-02-24 DIAGNOSIS — N186 End stage renal disease: Secondary | ICD-10-CM | POA: Diagnosis not present

## 2015-02-25 DIAGNOSIS — N186 End stage renal disease: Secondary | ICD-10-CM | POA: Diagnosis not present

## 2015-02-26 DIAGNOSIS — N186 End stage renal disease: Secondary | ICD-10-CM | POA: Diagnosis not present

## 2015-02-27 DIAGNOSIS — N186 End stage renal disease: Secondary | ICD-10-CM | POA: Diagnosis not present

## 2015-02-28 DIAGNOSIS — N186 End stage renal disease: Secondary | ICD-10-CM | POA: Diagnosis not present

## 2015-03-01 DIAGNOSIS — N186 End stage renal disease: Secondary | ICD-10-CM | POA: Diagnosis not present

## 2015-03-02 DIAGNOSIS — N186 End stage renal disease: Secondary | ICD-10-CM | POA: Diagnosis not present

## 2015-03-03 DIAGNOSIS — N186 End stage renal disease: Secondary | ICD-10-CM | POA: Diagnosis not present

## 2015-03-04 DIAGNOSIS — N186 End stage renal disease: Secondary | ICD-10-CM | POA: Diagnosis not present

## 2015-03-05 DIAGNOSIS — N186 End stage renal disease: Secondary | ICD-10-CM | POA: Diagnosis not present

## 2015-03-06 DIAGNOSIS — N186 End stage renal disease: Secondary | ICD-10-CM | POA: Diagnosis not present

## 2015-03-07 DIAGNOSIS — N186 End stage renal disease: Secondary | ICD-10-CM | POA: Diagnosis not present

## 2015-03-08 DIAGNOSIS — N186 End stage renal disease: Secondary | ICD-10-CM | POA: Diagnosis not present

## 2015-03-09 DIAGNOSIS — N186 End stage renal disease: Secondary | ICD-10-CM | POA: Diagnosis not present

## 2015-03-10 DIAGNOSIS — N033 Chronic nephritic syndrome with diffuse mesangial proliferative glomerulonephritis: Secondary | ICD-10-CM | POA: Diagnosis not present

## 2015-03-10 DIAGNOSIS — Z992 Dependence on renal dialysis: Secondary | ICD-10-CM | POA: Diagnosis not present

## 2015-03-10 DIAGNOSIS — N186 End stage renal disease: Secondary | ICD-10-CM | POA: Diagnosis not present

## 2015-03-11 DIAGNOSIS — N186 End stage renal disease: Secondary | ICD-10-CM | POA: Diagnosis not present

## 2015-03-12 DIAGNOSIS — N186 End stage renal disease: Secondary | ICD-10-CM | POA: Diagnosis not present

## 2015-03-13 DIAGNOSIS — N186 End stage renal disease: Secondary | ICD-10-CM | POA: Diagnosis not present

## 2015-03-14 DIAGNOSIS — N186 End stage renal disease: Secondary | ICD-10-CM | POA: Diagnosis not present

## 2015-03-15 DIAGNOSIS — N186 End stage renal disease: Secondary | ICD-10-CM | POA: Diagnosis not present

## 2015-03-16 DIAGNOSIS — N186 End stage renal disease: Secondary | ICD-10-CM | POA: Diagnosis not present

## 2015-03-17 DIAGNOSIS — N186 End stage renal disease: Secondary | ICD-10-CM | POA: Diagnosis not present

## 2015-03-18 DIAGNOSIS — N186 End stage renal disease: Secondary | ICD-10-CM | POA: Diagnosis not present

## 2015-03-19 DIAGNOSIS — N186 End stage renal disease: Secondary | ICD-10-CM | POA: Diagnosis not present

## 2015-03-20 DIAGNOSIS — N186 End stage renal disease: Secondary | ICD-10-CM | POA: Diagnosis not present

## 2015-03-21 DIAGNOSIS — N186 End stage renal disease: Secondary | ICD-10-CM | POA: Diagnosis not present

## 2015-03-22 DIAGNOSIS — N186 End stage renal disease: Secondary | ICD-10-CM | POA: Diagnosis not present

## 2015-03-23 DIAGNOSIS — N186 End stage renal disease: Secondary | ICD-10-CM | POA: Diagnosis not present

## 2015-03-24 DIAGNOSIS — N186 End stage renal disease: Secondary | ICD-10-CM | POA: Diagnosis not present

## 2015-03-25 DIAGNOSIS — N186 End stage renal disease: Secondary | ICD-10-CM | POA: Diagnosis not present

## 2015-03-26 DIAGNOSIS — N186 End stage renal disease: Secondary | ICD-10-CM | POA: Diagnosis not present

## 2015-03-27 DIAGNOSIS — N186 End stage renal disease: Secondary | ICD-10-CM | POA: Diagnosis not present

## 2015-03-28 DIAGNOSIS — N186 End stage renal disease: Secondary | ICD-10-CM | POA: Diagnosis not present

## 2015-03-29 DIAGNOSIS — N186 End stage renal disease: Secondary | ICD-10-CM | POA: Diagnosis not present

## 2015-03-30 DIAGNOSIS — N186 End stage renal disease: Secondary | ICD-10-CM | POA: Diagnosis not present

## 2015-03-31 DIAGNOSIS — N186 End stage renal disease: Secondary | ICD-10-CM | POA: Diagnosis not present

## 2015-04-01 DIAGNOSIS — N186 End stage renal disease: Secondary | ICD-10-CM | POA: Diagnosis not present

## 2015-04-02 DIAGNOSIS — N186 End stage renal disease: Secondary | ICD-10-CM | POA: Diagnosis not present

## 2015-04-03 DIAGNOSIS — N186 End stage renal disease: Secondary | ICD-10-CM | POA: Diagnosis not present

## 2015-04-04 DIAGNOSIS — N186 End stage renal disease: Secondary | ICD-10-CM | POA: Diagnosis not present

## 2015-04-05 DIAGNOSIS — N186 End stage renal disease: Secondary | ICD-10-CM | POA: Diagnosis not present

## 2015-04-06 DIAGNOSIS — N186 End stage renal disease: Secondary | ICD-10-CM | POA: Diagnosis not present

## 2015-04-07 DIAGNOSIS — N186 End stage renal disease: Secondary | ICD-10-CM | POA: Diagnosis not present

## 2015-04-08 DIAGNOSIS — N186 End stage renal disease: Secondary | ICD-10-CM | POA: Diagnosis not present

## 2015-04-09 DIAGNOSIS — N033 Chronic nephritic syndrome with diffuse mesangial proliferative glomerulonephritis: Secondary | ICD-10-CM | POA: Diagnosis not present

## 2015-04-09 DIAGNOSIS — N186 End stage renal disease: Secondary | ICD-10-CM | POA: Diagnosis not present

## 2015-04-09 DIAGNOSIS — Z992 Dependence on renal dialysis: Secondary | ICD-10-CM | POA: Diagnosis not present

## 2015-04-10 DIAGNOSIS — N186 End stage renal disease: Secondary | ICD-10-CM | POA: Diagnosis not present

## 2015-04-11 DIAGNOSIS — N186 End stage renal disease: Secondary | ICD-10-CM | POA: Diagnosis not present

## 2015-04-12 ENCOUNTER — Ambulatory Visit (INDEPENDENT_AMBULATORY_CARE_PROVIDER_SITE_OTHER): Payer: Medicare Other

## 2015-04-12 ENCOUNTER — Encounter: Payer: Self-pay | Admitting: Podiatry

## 2015-04-12 ENCOUNTER — Ambulatory Visit (INDEPENDENT_AMBULATORY_CARE_PROVIDER_SITE_OTHER): Payer: Medicare Other | Admitting: Podiatry

## 2015-04-12 DIAGNOSIS — M129 Arthropathy, unspecified: Secondary | ICD-10-CM

## 2015-04-12 DIAGNOSIS — M79676 Pain in unspecified toe(s): Secondary | ICD-10-CM

## 2015-04-12 DIAGNOSIS — M19079 Primary osteoarthritis, unspecified ankle and foot: Secondary | ICD-10-CM

## 2015-04-12 DIAGNOSIS — N186 End stage renal disease: Secondary | ICD-10-CM | POA: Diagnosis not present

## 2015-04-12 DIAGNOSIS — R52 Pain, unspecified: Secondary | ICD-10-CM

## 2015-04-12 DIAGNOSIS — M79674 Pain in right toe(s): Secondary | ICD-10-CM

## 2015-04-12 DIAGNOSIS — M79675 Pain in left toe(s): Secondary | ICD-10-CM

## 2015-04-12 DIAGNOSIS — B351 Tinea unguium: Secondary | ICD-10-CM | POA: Diagnosis not present

## 2015-04-12 DIAGNOSIS — E1140 Type 2 diabetes mellitus with diabetic neuropathy, unspecified: Secondary | ICD-10-CM

## 2015-04-12 NOTE — Progress Notes (Signed)
   Subjective:    Patient ID: Linda Jordan, female    DOB: 1930-04-28, 81 y.o.   MRN: [id number]  HPI This patient present to the office with long thick nails.  She says the nails are painful walking and wearing her shoes.   She says she is on kidney dialysis.  She presenrs for preventive foot care services.    Review of Systems  All other systems reviewed and are negative.      Objective:   Physical Exam  Exam: Vascular: dorsalis pedis and posterior tibial pulses are palpable bilateral. Capillary return is immediate. Temperature gradient is WNL. Skin turgor WNL  Sensorium: Normal Semmes Weinstein monofilament test. Normal tactile sensation bilaterally. Nail Exam: Pt has thick disfigured discolored nails with subungual debris noted bilateral entire nail hallux through fifth toenails Ulcer Exam: There is no evidence of ulcer or pre-ulcerative changes or infection. Orthopedic Exam: Muscle tone and strength are WNL. No limitations in general ROM. No crepitus or effusions noted. Foot type and digits show no abnormalities. Bony prominences are unremarkable. Toe contractures. No pain. Hammer toes left foot.Skin: No Porokeratosis. No infection or ulcers      Assessment & Plan:  Pain due to onychomycosis  Arthritis Rearfoot left.    Debride painful onychomycotic nails. X-ray taken revealing calcification at insertion plantar fascia left,  Hammer toe left and arthritis midfoot left.

## 2015-04-13 DIAGNOSIS — N186 End stage renal disease: Secondary | ICD-10-CM | POA: Diagnosis not present

## 2015-04-14 DIAGNOSIS — R059 Cough, unspecified: Secondary | ICD-10-CM | POA: Insufficient documentation

## 2015-04-14 DIAGNOSIS — R05 Cough: Secondary | ICD-10-CM | POA: Insufficient documentation

## 2015-04-14 DIAGNOSIS — N186 End stage renal disease: Secondary | ICD-10-CM | POA: Diagnosis not present

## 2015-04-15 DIAGNOSIS — N186 End stage renal disease: Secondary | ICD-10-CM | POA: Diagnosis not present

## 2015-04-16 DIAGNOSIS — N186 End stage renal disease: Secondary | ICD-10-CM | POA: Diagnosis not present

## 2015-04-17 DIAGNOSIS — N186 End stage renal disease: Secondary | ICD-10-CM | POA: Diagnosis not present

## 2015-04-18 DIAGNOSIS — N186 End stage renal disease: Secondary | ICD-10-CM | POA: Diagnosis not present

## 2015-04-19 DIAGNOSIS — N186 End stage renal disease: Secondary | ICD-10-CM | POA: Diagnosis not present

## 2015-04-20 DIAGNOSIS — N186 End stage renal disease: Secondary | ICD-10-CM | POA: Diagnosis not present

## 2015-04-21 DIAGNOSIS — N186 End stage renal disease: Secondary | ICD-10-CM | POA: Diagnosis not present

## 2015-04-22 DIAGNOSIS — N186 End stage renal disease: Secondary | ICD-10-CM | POA: Diagnosis not present

## 2015-04-23 DIAGNOSIS — N186 End stage renal disease: Secondary | ICD-10-CM | POA: Diagnosis not present

## 2015-04-24 DIAGNOSIS — N186 End stage renal disease: Secondary | ICD-10-CM | POA: Diagnosis not present

## 2015-04-25 DIAGNOSIS — N186 End stage renal disease: Secondary | ICD-10-CM | POA: Diagnosis not present

## 2015-04-26 DIAGNOSIS — N186 End stage renal disease: Secondary | ICD-10-CM | POA: Diagnosis not present

## 2015-04-27 DIAGNOSIS — N186 End stage renal disease: Secondary | ICD-10-CM | POA: Diagnosis not present

## 2015-04-28 DIAGNOSIS — N186 End stage renal disease: Secondary | ICD-10-CM | POA: Diagnosis not present

## 2015-04-29 DIAGNOSIS — M159 Polyosteoarthritis, unspecified: Secondary | ICD-10-CM | POA: Diagnosis not present

## 2015-04-29 DIAGNOSIS — M545 Low back pain: Secondary | ICD-10-CM | POA: Diagnosis not present

## 2015-04-29 DIAGNOSIS — N186 End stage renal disease: Secondary | ICD-10-CM | POA: Diagnosis not present

## 2015-04-29 DIAGNOSIS — Z23 Encounter for immunization: Secondary | ICD-10-CM | POA: Diagnosis not present

## 2015-04-29 DIAGNOSIS — J309 Allergic rhinitis, unspecified: Secondary | ICD-10-CM | POA: Diagnosis not present

## 2015-04-29 DIAGNOSIS — I82409 Acute embolism and thrombosis of unspecified deep veins of unspecified lower extremity: Secondary | ICD-10-CM | POA: Diagnosis not present

## 2015-04-29 DIAGNOSIS — B0229 Other postherpetic nervous system involvement: Secondary | ICD-10-CM | POA: Diagnosis not present

## 2015-04-29 DIAGNOSIS — K219 Gastro-esophageal reflux disease without esophagitis: Secondary | ICD-10-CM | POA: Diagnosis not present

## 2015-04-29 DIAGNOSIS — E1121 Type 2 diabetes mellitus with diabetic nephropathy: Secondary | ICD-10-CM | POA: Diagnosis not present

## 2015-04-29 DIAGNOSIS — J449 Chronic obstructive pulmonary disease, unspecified: Secondary | ICD-10-CM | POA: Diagnosis not present

## 2015-04-29 DIAGNOSIS — R600 Localized edema: Secondary | ICD-10-CM | POA: Diagnosis not present

## 2015-04-29 DIAGNOSIS — E785 Hyperlipidemia, unspecified: Secondary | ICD-10-CM | POA: Diagnosis not present

## 2015-04-30 DIAGNOSIS — N186 End stage renal disease: Secondary | ICD-10-CM | POA: Diagnosis not present

## 2015-05-01 DIAGNOSIS — N186 End stage renal disease: Secondary | ICD-10-CM | POA: Diagnosis not present

## 2015-05-02 DIAGNOSIS — N186 End stage renal disease: Secondary | ICD-10-CM | POA: Diagnosis not present

## 2015-05-03 DIAGNOSIS — N186 End stage renal disease: Secondary | ICD-10-CM | POA: Diagnosis not present

## 2015-05-04 DIAGNOSIS — N186 End stage renal disease: Secondary | ICD-10-CM | POA: Diagnosis not present

## 2015-05-05 DIAGNOSIS — N186 End stage renal disease: Secondary | ICD-10-CM | POA: Diagnosis not present

## 2015-05-06 DIAGNOSIS — N186 End stage renal disease: Secondary | ICD-10-CM | POA: Diagnosis not present

## 2015-05-07 DIAGNOSIS — N186 End stage renal disease: Secondary | ICD-10-CM | POA: Diagnosis not present

## 2015-05-08 DIAGNOSIS — N186 End stage renal disease: Secondary | ICD-10-CM | POA: Diagnosis not present

## 2015-05-09 DIAGNOSIS — N186 End stage renal disease: Secondary | ICD-10-CM | POA: Diagnosis not present

## 2015-05-10 DIAGNOSIS — N186 End stage renal disease: Secondary | ICD-10-CM | POA: Diagnosis not present

## 2015-05-10 DIAGNOSIS — Z992 Dependence on renal dialysis: Secondary | ICD-10-CM | POA: Diagnosis not present

## 2015-05-10 DIAGNOSIS — N033 Chronic nephritic syndrome with diffuse mesangial proliferative glomerulonephritis: Secondary | ICD-10-CM | POA: Diagnosis not present

## 2015-05-11 DIAGNOSIS — E876 Hypokalemia: Secondary | ICD-10-CM | POA: Diagnosis not present

## 2015-05-11 DIAGNOSIS — N186 End stage renal disease: Secondary | ICD-10-CM | POA: Diagnosis not present

## 2015-05-12 DIAGNOSIS — E876 Hypokalemia: Secondary | ICD-10-CM | POA: Diagnosis not present

## 2015-05-12 DIAGNOSIS — N186 End stage renal disease: Secondary | ICD-10-CM | POA: Diagnosis not present

## 2015-05-13 DIAGNOSIS — N186 End stage renal disease: Secondary | ICD-10-CM | POA: Diagnosis not present

## 2015-05-13 DIAGNOSIS — E876 Hypokalemia: Secondary | ICD-10-CM | POA: Diagnosis not present

## 2015-05-14 DIAGNOSIS — E876 Hypokalemia: Secondary | ICD-10-CM | POA: Diagnosis not present

## 2015-05-14 DIAGNOSIS — N186 End stage renal disease: Secondary | ICD-10-CM | POA: Diagnosis not present

## 2015-05-15 DIAGNOSIS — E876 Hypokalemia: Secondary | ICD-10-CM | POA: Diagnosis not present

## 2015-05-15 DIAGNOSIS — N186 End stage renal disease: Secondary | ICD-10-CM | POA: Diagnosis not present

## 2015-05-16 DIAGNOSIS — N186 End stage renal disease: Secondary | ICD-10-CM | POA: Diagnosis not present

## 2015-05-16 DIAGNOSIS — E876 Hypokalemia: Secondary | ICD-10-CM | POA: Diagnosis not present

## 2015-05-17 DIAGNOSIS — E876 Hypokalemia: Secondary | ICD-10-CM | POA: Diagnosis not present

## 2015-05-17 DIAGNOSIS — N186 End stage renal disease: Secondary | ICD-10-CM | POA: Diagnosis not present

## 2015-05-18 DIAGNOSIS — E876 Hypokalemia: Secondary | ICD-10-CM | POA: Diagnosis not present

## 2015-05-18 DIAGNOSIS — N186 End stage renal disease: Secondary | ICD-10-CM | POA: Diagnosis not present

## 2015-05-19 DIAGNOSIS — E876 Hypokalemia: Secondary | ICD-10-CM | POA: Diagnosis not present

## 2015-05-19 DIAGNOSIS — N186 End stage renal disease: Secondary | ICD-10-CM | POA: Diagnosis not present

## 2015-05-20 DIAGNOSIS — N186 End stage renal disease: Secondary | ICD-10-CM | POA: Diagnosis not present

## 2015-05-20 DIAGNOSIS — E876 Hypokalemia: Secondary | ICD-10-CM | POA: Diagnosis not present

## 2015-05-21 DIAGNOSIS — N186 End stage renal disease: Secondary | ICD-10-CM | POA: Diagnosis not present

## 2015-05-21 DIAGNOSIS — E876 Hypokalemia: Secondary | ICD-10-CM | POA: Diagnosis not present

## 2015-05-22 DIAGNOSIS — N186 End stage renal disease: Secondary | ICD-10-CM | POA: Diagnosis not present

## 2015-05-22 DIAGNOSIS — E876 Hypokalemia: Secondary | ICD-10-CM | POA: Diagnosis not present

## 2015-05-23 DIAGNOSIS — E876 Hypokalemia: Secondary | ICD-10-CM | POA: Diagnosis not present

## 2015-05-23 DIAGNOSIS — N186 End stage renal disease: Secondary | ICD-10-CM | POA: Diagnosis not present

## 2015-05-24 DIAGNOSIS — E1121 Type 2 diabetes mellitus with diabetic nephropathy: Secondary | ICD-10-CM | POA: Diagnosis not present

## 2015-05-24 DIAGNOSIS — N186 End stage renal disease: Secondary | ICD-10-CM | POA: Diagnosis not present

## 2015-05-24 DIAGNOSIS — B0229 Other postherpetic nervous system involvement: Secondary | ICD-10-CM | POA: Diagnosis not present

## 2015-05-24 DIAGNOSIS — K219 Gastro-esophageal reflux disease without esophagitis: Secondary | ICD-10-CM | POA: Diagnosis not present

## 2015-05-24 DIAGNOSIS — I82409 Acute embolism and thrombosis of unspecified deep veins of unspecified lower extremity: Secondary | ICD-10-CM | POA: Diagnosis not present

## 2015-05-24 DIAGNOSIS — R600 Localized edema: Secondary | ICD-10-CM | POA: Diagnosis not present

## 2015-05-24 DIAGNOSIS — E876 Hypokalemia: Secondary | ICD-10-CM | POA: Diagnosis not present

## 2015-05-24 DIAGNOSIS — M159 Polyosteoarthritis, unspecified: Secondary | ICD-10-CM | POA: Diagnosis not present

## 2015-05-24 DIAGNOSIS — I10 Essential (primary) hypertension: Secondary | ICD-10-CM | POA: Diagnosis not present

## 2015-05-24 DIAGNOSIS — M545 Low back pain: Secondary | ICD-10-CM | POA: Diagnosis not present

## 2015-05-24 DIAGNOSIS — J449 Chronic obstructive pulmonary disease, unspecified: Secondary | ICD-10-CM | POA: Diagnosis not present

## 2015-05-24 DIAGNOSIS — E785 Hyperlipidemia, unspecified: Secondary | ICD-10-CM | POA: Diagnosis not present

## 2015-05-24 DIAGNOSIS — J309 Allergic rhinitis, unspecified: Secondary | ICD-10-CM | POA: Diagnosis not present

## 2015-05-25 DIAGNOSIS — E876 Hypokalemia: Secondary | ICD-10-CM | POA: Diagnosis not present

## 2015-05-25 DIAGNOSIS — N186 End stage renal disease: Secondary | ICD-10-CM | POA: Diagnosis not present

## 2015-05-26 DIAGNOSIS — N186 End stage renal disease: Secondary | ICD-10-CM | POA: Diagnosis not present

## 2015-05-26 DIAGNOSIS — E876 Hypokalemia: Secondary | ICD-10-CM | POA: Diagnosis not present

## 2015-05-27 DIAGNOSIS — E876 Hypokalemia: Secondary | ICD-10-CM | POA: Diagnosis not present

## 2015-05-27 DIAGNOSIS — N186 End stage renal disease: Secondary | ICD-10-CM | POA: Diagnosis not present

## 2015-05-28 DIAGNOSIS — E876 Hypokalemia: Secondary | ICD-10-CM | POA: Diagnosis not present

## 2015-05-28 DIAGNOSIS — N186 End stage renal disease: Secondary | ICD-10-CM | POA: Diagnosis not present

## 2015-05-29 DIAGNOSIS — N186 End stage renal disease: Secondary | ICD-10-CM | POA: Diagnosis not present

## 2015-05-29 DIAGNOSIS — E876 Hypokalemia: Secondary | ICD-10-CM | POA: Diagnosis not present

## 2015-05-30 DIAGNOSIS — N186 End stage renal disease: Secondary | ICD-10-CM | POA: Diagnosis not present

## 2015-05-30 DIAGNOSIS — E876 Hypokalemia: Secondary | ICD-10-CM | POA: Diagnosis not present

## 2015-05-31 DIAGNOSIS — I10 Essential (primary) hypertension: Secondary | ICD-10-CM | POA: Diagnosis not present

## 2015-05-31 DIAGNOSIS — I82409 Acute embolism and thrombosis of unspecified deep veins of unspecified lower extremity: Secondary | ICD-10-CM | POA: Diagnosis not present

## 2015-05-31 DIAGNOSIS — E039 Hypothyroidism, unspecified: Secondary | ICD-10-CM | POA: Diagnosis not present

## 2015-05-31 DIAGNOSIS — B0229 Other postherpetic nervous system involvement: Secondary | ICD-10-CM | POA: Diagnosis not present

## 2015-05-31 DIAGNOSIS — E876 Hypokalemia: Secondary | ICD-10-CM | POA: Diagnosis not present

## 2015-05-31 DIAGNOSIS — M159 Polyosteoarthritis, unspecified: Secondary | ICD-10-CM | POA: Diagnosis not present

## 2015-05-31 DIAGNOSIS — K219 Gastro-esophageal reflux disease without esophagitis: Secondary | ICD-10-CM | POA: Diagnosis not present

## 2015-05-31 DIAGNOSIS — J449 Chronic obstructive pulmonary disease, unspecified: Secondary | ICD-10-CM | POA: Diagnosis not present

## 2015-05-31 DIAGNOSIS — J309 Allergic rhinitis, unspecified: Secondary | ICD-10-CM | POA: Diagnosis not present

## 2015-05-31 DIAGNOSIS — E785 Hyperlipidemia, unspecified: Secondary | ICD-10-CM | POA: Diagnosis not present

## 2015-05-31 DIAGNOSIS — N186 End stage renal disease: Secondary | ICD-10-CM | POA: Diagnosis not present

## 2015-05-31 DIAGNOSIS — R600 Localized edema: Secondary | ICD-10-CM | POA: Diagnosis not present

## 2015-05-31 DIAGNOSIS — E1121 Type 2 diabetes mellitus with diabetic nephropathy: Secondary | ICD-10-CM | POA: Diagnosis not present

## 2015-05-31 DIAGNOSIS — M545 Low back pain: Secondary | ICD-10-CM | POA: Diagnosis not present

## 2015-06-01 DIAGNOSIS — E876 Hypokalemia: Secondary | ICD-10-CM | POA: Diagnosis not present

## 2015-06-01 DIAGNOSIS — N186 End stage renal disease: Secondary | ICD-10-CM | POA: Diagnosis not present

## 2015-06-02 DIAGNOSIS — N186 End stage renal disease: Secondary | ICD-10-CM | POA: Diagnosis not present

## 2015-06-02 DIAGNOSIS — E876 Hypokalemia: Secondary | ICD-10-CM | POA: Diagnosis not present

## 2015-06-03 DIAGNOSIS — E876 Hypokalemia: Secondary | ICD-10-CM | POA: Diagnosis not present

## 2015-06-03 DIAGNOSIS — N186 End stage renal disease: Secondary | ICD-10-CM | POA: Diagnosis not present

## 2015-06-04 DIAGNOSIS — N186 End stage renal disease: Secondary | ICD-10-CM | POA: Diagnosis not present

## 2015-06-04 DIAGNOSIS — E876 Hypokalemia: Secondary | ICD-10-CM | POA: Diagnosis not present

## 2015-06-05 DIAGNOSIS — E876 Hypokalemia: Secondary | ICD-10-CM | POA: Diagnosis not present

## 2015-06-05 DIAGNOSIS — N186 End stage renal disease: Secondary | ICD-10-CM | POA: Diagnosis not present

## 2015-06-06 DIAGNOSIS — E876 Hypokalemia: Secondary | ICD-10-CM | POA: Diagnosis not present

## 2015-06-06 DIAGNOSIS — N186 End stage renal disease: Secondary | ICD-10-CM | POA: Diagnosis not present

## 2015-06-07 DIAGNOSIS — E876 Hypokalemia: Secondary | ICD-10-CM | POA: Diagnosis not present

## 2015-06-07 DIAGNOSIS — N186 End stage renal disease: Secondary | ICD-10-CM | POA: Diagnosis not present

## 2015-06-08 DIAGNOSIS — E876 Hypokalemia: Secondary | ICD-10-CM | POA: Diagnosis not present

## 2015-06-08 DIAGNOSIS — N186 End stage renal disease: Secondary | ICD-10-CM | POA: Diagnosis not present

## 2015-06-09 DIAGNOSIS — Z992 Dependence on renal dialysis: Secondary | ICD-10-CM | POA: Diagnosis not present

## 2015-06-09 DIAGNOSIS — N186 End stage renal disease: Secondary | ICD-10-CM | POA: Diagnosis not present

## 2015-06-09 DIAGNOSIS — N033 Chronic nephritic syndrome with diffuse mesangial proliferative glomerulonephritis: Secondary | ICD-10-CM | POA: Diagnosis not present

## 2015-06-09 DIAGNOSIS — E876 Hypokalemia: Secondary | ICD-10-CM | POA: Diagnosis not present

## 2015-06-10 DIAGNOSIS — N186 End stage renal disease: Secondary | ICD-10-CM | POA: Diagnosis not present

## 2015-06-11 DIAGNOSIS — N186 End stage renal disease: Secondary | ICD-10-CM | POA: Diagnosis not present

## 2015-06-12 DIAGNOSIS — N186 End stage renal disease: Secondary | ICD-10-CM | POA: Diagnosis not present

## 2015-06-13 DIAGNOSIS — N186 End stage renal disease: Secondary | ICD-10-CM | POA: Diagnosis not present

## 2015-06-14 DIAGNOSIS — N186 End stage renal disease: Secondary | ICD-10-CM | POA: Diagnosis not present

## 2015-06-15 DIAGNOSIS — N186 End stage renal disease: Secondary | ICD-10-CM | POA: Diagnosis not present

## 2015-06-16 DIAGNOSIS — E079 Disorder of thyroid, unspecified: Secondary | ICD-10-CM | POA: Diagnosis not present

## 2015-06-16 DIAGNOSIS — N186 End stage renal disease: Secondary | ICD-10-CM | POA: Diagnosis not present

## 2015-06-17 DIAGNOSIS — R600 Localized edema: Secondary | ICD-10-CM | POA: Diagnosis not present

## 2015-06-17 DIAGNOSIS — E1121 Type 2 diabetes mellitus with diabetic nephropathy: Secondary | ICD-10-CM | POA: Diagnosis not present

## 2015-06-17 DIAGNOSIS — N186 End stage renal disease: Secondary | ICD-10-CM | POA: Diagnosis not present

## 2015-06-17 DIAGNOSIS — N63 Unspecified lump in breast: Secondary | ICD-10-CM | POA: Diagnosis not present

## 2015-06-17 DIAGNOSIS — I82409 Acute embolism and thrombosis of unspecified deep veins of unspecified lower extremity: Secondary | ICD-10-CM | POA: Diagnosis not present

## 2015-06-17 DIAGNOSIS — M159 Polyosteoarthritis, unspecified: Secondary | ICD-10-CM | POA: Diagnosis not present

## 2015-06-17 DIAGNOSIS — K219 Gastro-esophageal reflux disease without esophagitis: Secondary | ICD-10-CM | POA: Diagnosis not present

## 2015-06-17 DIAGNOSIS — J309 Allergic rhinitis, unspecified: Secondary | ICD-10-CM | POA: Diagnosis not present

## 2015-06-17 DIAGNOSIS — B0229 Other postherpetic nervous system involvement: Secondary | ICD-10-CM | POA: Diagnosis not present

## 2015-06-17 DIAGNOSIS — J449 Chronic obstructive pulmonary disease, unspecified: Secondary | ICD-10-CM | POA: Diagnosis not present

## 2015-06-17 DIAGNOSIS — M545 Low back pain: Secondary | ICD-10-CM | POA: Diagnosis not present

## 2015-06-17 DIAGNOSIS — Z1389 Encounter for screening for other disorder: Secondary | ICD-10-CM | POA: Diagnosis not present

## 2015-06-18 DIAGNOSIS — N186 End stage renal disease: Secondary | ICD-10-CM | POA: Diagnosis not present

## 2015-06-19 DIAGNOSIS — N186 End stage renal disease: Secondary | ICD-10-CM | POA: Diagnosis not present

## 2015-06-20 DIAGNOSIS — N186 End stage renal disease: Secondary | ICD-10-CM | POA: Diagnosis not present

## 2015-06-21 DIAGNOSIS — N186 End stage renal disease: Secondary | ICD-10-CM | POA: Diagnosis not present

## 2015-06-22 DIAGNOSIS — N186 End stage renal disease: Secondary | ICD-10-CM | POA: Diagnosis not present

## 2015-06-23 DIAGNOSIS — N186 End stage renal disease: Secondary | ICD-10-CM | POA: Diagnosis not present

## 2015-06-24 DIAGNOSIS — N186 End stage renal disease: Secondary | ICD-10-CM | POA: Diagnosis not present

## 2015-06-25 DIAGNOSIS — N186 End stage renal disease: Secondary | ICD-10-CM | POA: Diagnosis not present

## 2015-06-26 DIAGNOSIS — N186 End stage renal disease: Secondary | ICD-10-CM | POA: Diagnosis not present

## 2015-06-27 DIAGNOSIS — N186 End stage renal disease: Secondary | ICD-10-CM | POA: Diagnosis not present

## 2015-06-28 DIAGNOSIS — N186 End stage renal disease: Secondary | ICD-10-CM | POA: Diagnosis not present

## 2015-06-29 DIAGNOSIS — N186 End stage renal disease: Secondary | ICD-10-CM | POA: Diagnosis not present

## 2015-06-30 DIAGNOSIS — R600 Localized edema: Secondary | ICD-10-CM | POA: Diagnosis not present

## 2015-06-30 DIAGNOSIS — M545 Low back pain: Secondary | ICD-10-CM | POA: Diagnosis not present

## 2015-06-30 DIAGNOSIS — E785 Hyperlipidemia, unspecified: Secondary | ICD-10-CM | POA: Diagnosis not present

## 2015-06-30 DIAGNOSIS — J309 Allergic rhinitis, unspecified: Secondary | ICD-10-CM | POA: Diagnosis not present

## 2015-06-30 DIAGNOSIS — J449 Chronic obstructive pulmonary disease, unspecified: Secondary | ICD-10-CM | POA: Diagnosis not present

## 2015-06-30 DIAGNOSIS — N186 End stage renal disease: Secondary | ICD-10-CM | POA: Diagnosis not present

## 2015-06-30 DIAGNOSIS — I82409 Acute embolism and thrombosis of unspecified deep veins of unspecified lower extremity: Secondary | ICD-10-CM | POA: Diagnosis not present

## 2015-06-30 DIAGNOSIS — B0229 Other postherpetic nervous system involvement: Secondary | ICD-10-CM | POA: Diagnosis not present

## 2015-06-30 DIAGNOSIS — Z1389 Encounter for screening for other disorder: Secondary | ICD-10-CM | POA: Diagnosis not present

## 2015-06-30 DIAGNOSIS — M159 Polyosteoarthritis, unspecified: Secondary | ICD-10-CM | POA: Diagnosis not present

## 2015-06-30 DIAGNOSIS — K219 Gastro-esophageal reflux disease without esophagitis: Secondary | ICD-10-CM | POA: Diagnosis not present

## 2015-06-30 DIAGNOSIS — E039 Hypothyroidism, unspecified: Secondary | ICD-10-CM | POA: Diagnosis not present

## 2015-06-30 DIAGNOSIS — N63 Unspecified lump in breast: Secondary | ICD-10-CM | POA: Diagnosis not present

## 2015-06-30 DIAGNOSIS — E1121 Type 2 diabetes mellitus with diabetic nephropathy: Secondary | ICD-10-CM | POA: Diagnosis not present

## 2015-07-01 DIAGNOSIS — N186 End stage renal disease: Secondary | ICD-10-CM | POA: Diagnosis not present

## 2015-07-02 DIAGNOSIS — N186 End stage renal disease: Secondary | ICD-10-CM | POA: Diagnosis not present

## 2015-07-03 DIAGNOSIS — N186 End stage renal disease: Secondary | ICD-10-CM | POA: Diagnosis not present

## 2015-07-04 DIAGNOSIS — N186 End stage renal disease: Secondary | ICD-10-CM | POA: Diagnosis not present

## 2015-07-05 DIAGNOSIS — N186 End stage renal disease: Secondary | ICD-10-CM | POA: Diagnosis not present

## 2015-07-06 DIAGNOSIS — N186 End stage renal disease: Secondary | ICD-10-CM | POA: Diagnosis not present

## 2015-07-07 DIAGNOSIS — N186 End stage renal disease: Secondary | ICD-10-CM | POA: Diagnosis not present

## 2015-07-08 DIAGNOSIS — N186 End stage renal disease: Secondary | ICD-10-CM | POA: Diagnosis not present

## 2015-07-09 DIAGNOSIS — N186 End stage renal disease: Secondary | ICD-10-CM | POA: Diagnosis not present

## 2015-07-10 DIAGNOSIS — N033 Chronic nephritic syndrome with diffuse mesangial proliferative glomerulonephritis: Secondary | ICD-10-CM | POA: Diagnosis not present

## 2015-07-10 DIAGNOSIS — N186 End stage renal disease: Secondary | ICD-10-CM | POA: Diagnosis not present

## 2015-07-10 DIAGNOSIS — Z992 Dependence on renal dialysis: Secondary | ICD-10-CM | POA: Diagnosis not present

## 2015-07-11 DIAGNOSIS — N186 End stage renal disease: Secondary | ICD-10-CM | POA: Diagnosis not present

## 2015-07-12 DIAGNOSIS — N186 End stage renal disease: Secondary | ICD-10-CM | POA: Diagnosis not present

## 2015-07-13 DIAGNOSIS — N186 End stage renal disease: Secondary | ICD-10-CM | POA: Diagnosis not present

## 2015-07-14 DIAGNOSIS — N186 End stage renal disease: Secondary | ICD-10-CM | POA: Diagnosis not present

## 2015-07-15 DIAGNOSIS — N186 End stage renal disease: Secondary | ICD-10-CM | POA: Diagnosis not present

## 2015-07-16 DIAGNOSIS — N186 End stage renal disease: Secondary | ICD-10-CM | POA: Diagnosis not present

## 2015-07-17 DIAGNOSIS — N186 End stage renal disease: Secondary | ICD-10-CM | POA: Diagnosis not present

## 2015-07-18 DIAGNOSIS — N186 End stage renal disease: Secondary | ICD-10-CM | POA: Diagnosis not present

## 2015-07-19 ENCOUNTER — Ambulatory Visit: Payer: Medicare Other | Admitting: Podiatry

## 2015-07-19 ENCOUNTER — Other Ambulatory Visit: Payer: Self-pay

## 2015-07-19 DIAGNOSIS — C50412 Malignant neoplasm of upper-outer quadrant of left female breast: Secondary | ICD-10-CM | POA: Diagnosis not present

## 2015-07-19 DIAGNOSIS — N186 End stage renal disease: Secondary | ICD-10-CM | POA: Diagnosis not present

## 2015-07-19 DIAGNOSIS — N63 Unspecified lump in breast: Secondary | ICD-10-CM | POA: Diagnosis not present

## 2015-07-20 DIAGNOSIS — N186 End stage renal disease: Secondary | ICD-10-CM | POA: Diagnosis not present

## 2015-07-21 DIAGNOSIS — N186 End stage renal disease: Secondary | ICD-10-CM | POA: Diagnosis not present

## 2015-07-22 DIAGNOSIS — M545 Low back pain: Secondary | ICD-10-CM | POA: Diagnosis not present

## 2015-07-22 DIAGNOSIS — K219 Gastro-esophageal reflux disease without esophagitis: Secondary | ICD-10-CM | POA: Diagnosis not present

## 2015-07-22 DIAGNOSIS — J309 Allergic rhinitis, unspecified: Secondary | ICD-10-CM | POA: Diagnosis not present

## 2015-07-22 DIAGNOSIS — E1121 Type 2 diabetes mellitus with diabetic nephropathy: Secondary | ICD-10-CM | POA: Diagnosis not present

## 2015-07-22 DIAGNOSIS — R600 Localized edema: Secondary | ICD-10-CM | POA: Diagnosis not present

## 2015-07-22 DIAGNOSIS — D0582 Other specified type of carcinoma in situ of left breast: Secondary | ICD-10-CM | POA: Diagnosis not present

## 2015-07-22 DIAGNOSIS — B0229 Other postherpetic nervous system involvement: Secondary | ICD-10-CM | POA: Diagnosis not present

## 2015-07-22 DIAGNOSIS — E785 Hyperlipidemia, unspecified: Secondary | ICD-10-CM | POA: Diagnosis not present

## 2015-07-22 DIAGNOSIS — N186 End stage renal disease: Secondary | ICD-10-CM | POA: Diagnosis not present

## 2015-07-22 DIAGNOSIS — J449 Chronic obstructive pulmonary disease, unspecified: Secondary | ICD-10-CM | POA: Diagnosis not present

## 2015-07-22 DIAGNOSIS — M159 Polyosteoarthritis, unspecified: Secondary | ICD-10-CM | POA: Diagnosis not present

## 2015-07-22 DIAGNOSIS — I82409 Acute embolism and thrombosis of unspecified deep veins of unspecified lower extremity: Secondary | ICD-10-CM | POA: Diagnosis not present

## 2015-07-23 DIAGNOSIS — N186 End stage renal disease: Secondary | ICD-10-CM | POA: Diagnosis not present

## 2015-07-24 DIAGNOSIS — N186 End stage renal disease: Secondary | ICD-10-CM | POA: Diagnosis not present

## 2015-07-25 DIAGNOSIS — N186 End stage renal disease: Secondary | ICD-10-CM | POA: Diagnosis not present

## 2015-07-26 ENCOUNTER — Encounter: Payer: Self-pay | Admitting: Podiatry

## 2015-07-26 ENCOUNTER — Ambulatory Visit (INDEPENDENT_AMBULATORY_CARE_PROVIDER_SITE_OTHER): Payer: Medicare Other | Admitting: Podiatry

## 2015-07-26 DIAGNOSIS — E1140 Type 2 diabetes mellitus with diabetic neuropathy, unspecified: Secondary | ICD-10-CM | POA: Diagnosis not present

## 2015-07-26 DIAGNOSIS — M79676 Pain in unspecified toe(s): Secondary | ICD-10-CM | POA: Diagnosis not present

## 2015-07-26 DIAGNOSIS — B351 Tinea unguium: Secondary | ICD-10-CM

## 2015-07-26 DIAGNOSIS — N186 End stage renal disease: Secondary | ICD-10-CM | POA: Diagnosis not present

## 2015-07-26 DIAGNOSIS — M79675 Pain in left toe(s): Secondary | ICD-10-CM | POA: Diagnosis not present

## 2015-07-26 NOTE — Progress Notes (Signed)
Patient ID: Linda Jordan, female   DOB: 08/06/1929, 82 y.o.   MRN: [id number] Complaint:  Visit Type: Patient returns to my office for continued preventative foot care services. Complaint: Patient states" my nails have grown long and thick and become painful to walk and wear shoes" Patient has been diagnosed with DM with no complications. He presents for preventative foot care services. No changes to ROS  Podiatric Exam: Vascular: dorsalis pedis and posterior tibial pulses are palpable bilateral. Capillary return is immediate. Temperature gradient is WNL. Skin turgor WNL  Sensorium: Normal Semmes Weinstein monofilament test. Normal tactile sensation bilaterally. Nail Exam: Pt has thick disfigured discolored nails with subungual debris noted bilateral entire nail hallux through fifth toenails Ulcer Exam: There is no evidence of ulcer or pre-ulcerative changes or infection. Orthopedic Exam: Muscle tone and strength are WNL. No limitations in general ROM. No crepitus or effusions noted. Foot type and digits show no abnormalities. Bony prominences are unremarkable. Toe contractures.  No pain. Skin: No Porokeratosis. No infection or ulcers  Diagnosis:  Tinea unguium, Pain in right toe, pain in left toes  Treatment & Plan Procedures and Treatment: Consent by patient was obtained for treatment procedures. The patient understood the discussion of treatment and procedures well. All questions were answered thoroughly reviewed. Debridement of mycotic and hypertrophic toenails, 1 through 5 bilateral and clearing of subungual debris. No ulceration, no infection noted.  Return Visit-Office Procedure: Patient instructed to return to the office for a follow up visit 3 months for continued evaluation and treatment.   Reiss Mowrey DPM 

## 2015-07-27 DIAGNOSIS — N186 End stage renal disease: Secondary | ICD-10-CM | POA: Diagnosis not present

## 2015-07-28 DIAGNOSIS — N186 End stage renal disease: Secondary | ICD-10-CM | POA: Diagnosis not present

## 2015-07-28 DIAGNOSIS — C50419 Malignant neoplasm of upper-outer quadrant of unspecified female breast: Secondary | ICD-10-CM | POA: Diagnosis not present

## 2015-07-28 DIAGNOSIS — I10 Essential (primary) hypertension: Secondary | ICD-10-CM | POA: Diagnosis not present

## 2015-07-28 DIAGNOSIS — Z6824 Body mass index (BMI) 24.0-24.9, adult: Secondary | ICD-10-CM | POA: Diagnosis not present

## 2015-07-29 DIAGNOSIS — N186 End stage renal disease: Secondary | ICD-10-CM | POA: Diagnosis not present

## 2015-07-30 DIAGNOSIS — N186 End stage renal disease: Secondary | ICD-10-CM | POA: Diagnosis not present

## 2015-07-31 DIAGNOSIS — N186 End stage renal disease: Secondary | ICD-10-CM | POA: Diagnosis not present

## 2015-08-01 DIAGNOSIS — N186 End stage renal disease: Secondary | ICD-10-CM | POA: Diagnosis not present

## 2015-08-02 DIAGNOSIS — M159 Polyosteoarthritis, unspecified: Secondary | ICD-10-CM | POA: Diagnosis not present

## 2015-08-02 DIAGNOSIS — Z7982 Long term (current) use of aspirin: Secondary | ICD-10-CM | POA: Diagnosis not present

## 2015-08-02 DIAGNOSIS — R600 Localized edema: Secondary | ICD-10-CM | POA: Diagnosis not present

## 2015-08-02 DIAGNOSIS — I120 Hypertensive chronic kidney disease with stage 5 chronic kidney disease or end stage renal disease: Secondary | ICD-10-CM | POA: Diagnosis not present

## 2015-08-02 DIAGNOSIS — K219 Gastro-esophageal reflux disease without esophagitis: Secondary | ICD-10-CM | POA: Diagnosis not present

## 2015-08-02 DIAGNOSIS — J309 Allergic rhinitis, unspecified: Secondary | ICD-10-CM | POA: Diagnosis not present

## 2015-08-02 DIAGNOSIS — Z79899 Other long term (current) drug therapy: Secondary | ICD-10-CM | POA: Diagnosis not present

## 2015-08-02 DIAGNOSIS — N186 End stage renal disease: Secondary | ICD-10-CM | POA: Diagnosis not present

## 2015-08-02 DIAGNOSIS — E1121 Type 2 diabetes mellitus with diabetic nephropathy: Secondary | ICD-10-CM | POA: Diagnosis not present

## 2015-08-02 DIAGNOSIS — E039 Hypothyroidism, unspecified: Secondary | ICD-10-CM | POA: Diagnosis not present

## 2015-08-02 DIAGNOSIS — Z992 Dependence on renal dialysis: Secondary | ICD-10-CM | POA: Diagnosis not present

## 2015-08-02 DIAGNOSIS — C50412 Malignant neoplasm of upper-outer quadrant of left female breast: Secondary | ICD-10-CM | POA: Diagnosis not present

## 2015-08-02 DIAGNOSIS — E1122 Type 2 diabetes mellitus with diabetic chronic kidney disease: Secondary | ICD-10-CM | POA: Diagnosis not present

## 2015-08-02 DIAGNOSIS — E785 Hyperlipidemia, unspecified: Secondary | ICD-10-CM | POA: Diagnosis not present

## 2015-08-02 DIAGNOSIS — M545 Low back pain: Secondary | ICD-10-CM | POA: Diagnosis not present

## 2015-08-02 DIAGNOSIS — J449 Chronic obstructive pulmonary disease, unspecified: Secondary | ICD-10-CM | POA: Diagnosis not present

## 2015-08-02 DIAGNOSIS — I82409 Acute embolism and thrombosis of unspecified deep veins of unspecified lower extremity: Secondary | ICD-10-CM | POA: Diagnosis not present

## 2015-08-02 DIAGNOSIS — Z803 Family history of malignant neoplasm of breast: Secondary | ICD-10-CM | POA: Diagnosis not present

## 2015-08-02 DIAGNOSIS — Z170 Estrogen receptor positive status [ER+]: Secondary | ICD-10-CM | POA: Diagnosis not present

## 2015-08-02 DIAGNOSIS — E7800 Pure hypercholesterolemia, unspecified: Secondary | ICD-10-CM | POA: Diagnosis not present

## 2015-08-02 DIAGNOSIS — B0229 Other postherpetic nervous system involvement: Secondary | ICD-10-CM | POA: Diagnosis not present

## 2015-08-03 DIAGNOSIS — N186 End stage renal disease: Secondary | ICD-10-CM | POA: Diagnosis not present

## 2015-08-04 DIAGNOSIS — N186 End stage renal disease: Secondary | ICD-10-CM | POA: Diagnosis not present

## 2015-08-05 DIAGNOSIS — N186 End stage renal disease: Secondary | ICD-10-CM | POA: Diagnosis not present

## 2015-08-06 ENCOUNTER — Other Ambulatory Visit: Payer: Self-pay

## 2015-08-06 DIAGNOSIS — G629 Polyneuropathy, unspecified: Secondary | ICD-10-CM | POA: Diagnosis not present

## 2015-08-06 DIAGNOSIS — M1990 Unspecified osteoarthritis, unspecified site: Secondary | ICD-10-CM | POA: Diagnosis not present

## 2015-08-06 DIAGNOSIS — K219 Gastro-esophageal reflux disease without esophagitis: Secondary | ICD-10-CM | POA: Diagnosis not present

## 2015-08-06 DIAGNOSIS — E785 Hyperlipidemia, unspecified: Secondary | ICD-10-CM | POA: Diagnosis not present

## 2015-08-06 DIAGNOSIS — E119 Type 2 diabetes mellitus without complications: Secondary | ICD-10-CM | POA: Diagnosis not present

## 2015-08-06 DIAGNOSIS — E039 Hypothyroidism, unspecified: Secondary | ICD-10-CM | POA: Diagnosis not present

## 2015-08-06 DIAGNOSIS — C50912 Malignant neoplasm of unspecified site of left female breast: Secondary | ICD-10-CM | POA: Diagnosis not present

## 2015-08-06 DIAGNOSIS — N186 End stage renal disease: Secondary | ICD-10-CM | POA: Diagnosis not present

## 2015-08-06 DIAGNOSIS — I120 Hypertensive chronic kidney disease with stage 5 chronic kidney disease or end stage renal disease: Secondary | ICD-10-CM | POA: Diagnosis not present

## 2015-08-06 DIAGNOSIS — Z79899 Other long term (current) drug therapy: Secondary | ICD-10-CM | POA: Diagnosis not present

## 2015-08-06 DIAGNOSIS — Z992 Dependence on renal dialysis: Secondary | ICD-10-CM | POA: Diagnosis not present

## 2015-08-06 DIAGNOSIS — J449 Chronic obstructive pulmonary disease, unspecified: Secondary | ICD-10-CM | POA: Diagnosis not present

## 2015-08-06 DIAGNOSIS — C50412 Malignant neoplasm of upper-outer quadrant of left female breast: Secondary | ICD-10-CM | POA: Diagnosis not present

## 2015-08-06 DIAGNOSIS — I10 Essential (primary) hypertension: Secondary | ICD-10-CM | POA: Diagnosis not present

## 2015-08-07 DIAGNOSIS — N186 End stage renal disease: Secondary | ICD-10-CM | POA: Diagnosis not present

## 2015-08-08 DIAGNOSIS — N186 End stage renal disease: Secondary | ICD-10-CM | POA: Diagnosis not present

## 2015-08-09 DIAGNOSIS — N186 End stage renal disease: Secondary | ICD-10-CM | POA: Diagnosis not present

## 2015-08-10 DIAGNOSIS — Z992 Dependence on renal dialysis: Secondary | ICD-10-CM | POA: Diagnosis not present

## 2015-08-10 DIAGNOSIS — N033 Chronic nephritic syndrome with diffuse mesangial proliferative glomerulonephritis: Secondary | ICD-10-CM | POA: Diagnosis not present

## 2015-08-10 DIAGNOSIS — N186 End stage renal disease: Secondary | ICD-10-CM | POA: Diagnosis not present

## 2015-08-11 DIAGNOSIS — N186 End stage renal disease: Secondary | ICD-10-CM | POA: Diagnosis not present

## 2015-08-12 DIAGNOSIS — N186 End stage renal disease: Secondary | ICD-10-CM | POA: Diagnosis not present

## 2015-08-13 DIAGNOSIS — N186 End stage renal disease: Secondary | ICD-10-CM | POA: Diagnosis not present

## 2015-08-14 DIAGNOSIS — N186 End stage renal disease: Secondary | ICD-10-CM | POA: Diagnosis not present

## 2015-08-15 DIAGNOSIS — N186 End stage renal disease: Secondary | ICD-10-CM | POA: Diagnosis not present

## 2015-08-16 DIAGNOSIS — N186 End stage renal disease: Secondary | ICD-10-CM | POA: Diagnosis not present

## 2015-08-17 DIAGNOSIS — N186 End stage renal disease: Secondary | ICD-10-CM | POA: Diagnosis not present

## 2015-08-18 DIAGNOSIS — N186 End stage renal disease: Secondary | ICD-10-CM | POA: Diagnosis not present

## 2015-08-19 DIAGNOSIS — N186 End stage renal disease: Secondary | ICD-10-CM | POA: Diagnosis not present

## 2015-08-20 DIAGNOSIS — N186 End stage renal disease: Secondary | ICD-10-CM | POA: Diagnosis not present

## 2015-08-21 DIAGNOSIS — N186 End stage renal disease: Secondary | ICD-10-CM | POA: Diagnosis not present

## 2015-08-22 DIAGNOSIS — N186 End stage renal disease: Secondary | ICD-10-CM | POA: Diagnosis not present

## 2015-08-23 DIAGNOSIS — Z170 Estrogen receptor positive status [ER+]: Secondary | ICD-10-CM | POA: Diagnosis not present

## 2015-08-23 DIAGNOSIS — N186 End stage renal disease: Secondary | ICD-10-CM | POA: Diagnosis not present

## 2015-08-23 DIAGNOSIS — C50919 Malignant neoplasm of unspecified site of unspecified female breast: Secondary | ICD-10-CM | POA: Diagnosis not present

## 2015-08-23 DIAGNOSIS — C50412 Malignant neoplasm of upper-outer quadrant of left female breast: Secondary | ICD-10-CM | POA: Diagnosis not present

## 2015-08-24 DIAGNOSIS — N186 End stage renal disease: Secondary | ICD-10-CM | POA: Diagnosis not present

## 2015-08-25 DIAGNOSIS — N186 End stage renal disease: Secondary | ICD-10-CM | POA: Diagnosis not present

## 2015-08-26 DIAGNOSIS — N186 End stage renal disease: Secondary | ICD-10-CM | POA: Diagnosis not present

## 2015-08-26 DIAGNOSIS — C50412 Malignant neoplasm of upper-outer quadrant of left female breast: Secondary | ICD-10-CM | POA: Diagnosis not present

## 2015-08-27 DIAGNOSIS — N186 End stage renal disease: Secondary | ICD-10-CM | POA: Diagnosis not present

## 2015-08-28 DIAGNOSIS — N186 End stage renal disease: Secondary | ICD-10-CM | POA: Diagnosis not present

## 2015-08-29 DIAGNOSIS — N186 End stage renal disease: Secondary | ICD-10-CM | POA: Diagnosis not present

## 2015-08-30 DIAGNOSIS — N186 End stage renal disease: Secondary | ICD-10-CM | POA: Diagnosis not present

## 2015-08-31 DIAGNOSIS — J309 Allergic rhinitis, unspecified: Secondary | ICD-10-CM | POA: Diagnosis not present

## 2015-08-31 DIAGNOSIS — M545 Low back pain: Secondary | ICD-10-CM | POA: Diagnosis not present

## 2015-08-31 DIAGNOSIS — E039 Hypothyroidism, unspecified: Secondary | ICD-10-CM | POA: Diagnosis not present

## 2015-08-31 DIAGNOSIS — I82409 Acute embolism and thrombosis of unspecified deep veins of unspecified lower extremity: Secondary | ICD-10-CM | POA: Diagnosis not present

## 2015-08-31 DIAGNOSIS — J449 Chronic obstructive pulmonary disease, unspecified: Secondary | ICD-10-CM | POA: Diagnosis not present

## 2015-08-31 DIAGNOSIS — R600 Localized edema: Secondary | ICD-10-CM | POA: Diagnosis not present

## 2015-08-31 DIAGNOSIS — B0229 Other postherpetic nervous system involvement: Secondary | ICD-10-CM | POA: Diagnosis not present

## 2015-08-31 DIAGNOSIS — M159 Polyosteoarthritis, unspecified: Secondary | ICD-10-CM | POA: Diagnosis not present

## 2015-08-31 DIAGNOSIS — E785 Hyperlipidemia, unspecified: Secondary | ICD-10-CM | POA: Diagnosis not present

## 2015-08-31 DIAGNOSIS — E1121 Type 2 diabetes mellitus with diabetic nephropathy: Secondary | ICD-10-CM | POA: Diagnosis not present

## 2015-08-31 DIAGNOSIS — N186 End stage renal disease: Secondary | ICD-10-CM | POA: Diagnosis not present

## 2015-09-01 DIAGNOSIS — N186 End stage renal disease: Secondary | ICD-10-CM | POA: Diagnosis not present

## 2015-09-02 DIAGNOSIS — N186 End stage renal disease: Secondary | ICD-10-CM | POA: Diagnosis not present

## 2015-09-03 DIAGNOSIS — N186 End stage renal disease: Secondary | ICD-10-CM | POA: Diagnosis not present

## 2015-09-04 DIAGNOSIS — N186 End stage renal disease: Secondary | ICD-10-CM | POA: Diagnosis not present

## 2015-09-05 DIAGNOSIS — N186 End stage renal disease: Secondary | ICD-10-CM | POA: Diagnosis not present

## 2015-09-06 DIAGNOSIS — N186 End stage renal disease: Secondary | ICD-10-CM | POA: Diagnosis not present

## 2015-09-07 DIAGNOSIS — N186 End stage renal disease: Secondary | ICD-10-CM | POA: Diagnosis not present

## 2015-09-07 DIAGNOSIS — N033 Chronic nephritic syndrome with diffuse mesangial proliferative glomerulonephritis: Secondary | ICD-10-CM | POA: Diagnosis not present

## 2015-09-07 DIAGNOSIS — Z992 Dependence on renal dialysis: Secondary | ICD-10-CM | POA: Diagnosis not present

## 2015-09-08 DIAGNOSIS — N186 End stage renal disease: Secondary | ICD-10-CM | POA: Diagnosis not present

## 2015-09-08 DIAGNOSIS — N2581 Secondary hyperparathyroidism of renal origin: Secondary | ICD-10-CM | POA: Diagnosis not present

## 2015-09-09 DIAGNOSIS — N186 End stage renal disease: Secondary | ICD-10-CM | POA: Diagnosis not present

## 2015-09-09 DIAGNOSIS — N2581 Secondary hyperparathyroidism of renal origin: Secondary | ICD-10-CM | POA: Diagnosis not present

## 2015-09-09 DIAGNOSIS — Z111 Encounter for screening for respiratory tuberculosis: Secondary | ICD-10-CM | POA: Diagnosis not present

## 2015-09-10 DIAGNOSIS — N186 End stage renal disease: Secondary | ICD-10-CM | POA: Diagnosis not present

## 2015-09-10 DIAGNOSIS — N2581 Secondary hyperparathyroidism of renal origin: Secondary | ICD-10-CM | POA: Diagnosis not present

## 2015-09-11 DIAGNOSIS — N2581 Secondary hyperparathyroidism of renal origin: Secondary | ICD-10-CM | POA: Diagnosis not present

## 2015-09-11 DIAGNOSIS — N186 End stage renal disease: Secondary | ICD-10-CM | POA: Diagnosis not present

## 2015-09-12 DIAGNOSIS — N186 End stage renal disease: Secondary | ICD-10-CM | POA: Diagnosis not present

## 2015-09-12 DIAGNOSIS — N2581 Secondary hyperparathyroidism of renal origin: Secondary | ICD-10-CM | POA: Diagnosis not present

## 2015-09-13 DIAGNOSIS — N2581 Secondary hyperparathyroidism of renal origin: Secondary | ICD-10-CM | POA: Diagnosis not present

## 2015-09-13 DIAGNOSIS — N186 End stage renal disease: Secondary | ICD-10-CM | POA: Diagnosis not present

## 2015-09-14 DIAGNOSIS — N186 End stage renal disease: Secondary | ICD-10-CM | POA: Diagnosis not present

## 2015-09-14 DIAGNOSIS — N2581 Secondary hyperparathyroidism of renal origin: Secondary | ICD-10-CM | POA: Diagnosis not present

## 2015-09-15 DIAGNOSIS — N2581 Secondary hyperparathyroidism of renal origin: Secondary | ICD-10-CM | POA: Diagnosis not present

## 2015-09-15 DIAGNOSIS — N186 End stage renal disease: Secondary | ICD-10-CM | POA: Diagnosis not present

## 2015-09-16 DIAGNOSIS — N2581 Secondary hyperparathyroidism of renal origin: Secondary | ICD-10-CM | POA: Diagnosis not present

## 2015-09-16 DIAGNOSIS — N186 End stage renal disease: Secondary | ICD-10-CM | POA: Diagnosis not present

## 2015-09-17 DIAGNOSIS — N186 End stage renal disease: Secondary | ICD-10-CM | POA: Diagnosis not present

## 2015-09-17 DIAGNOSIS — N2581 Secondary hyperparathyroidism of renal origin: Secondary | ICD-10-CM | POA: Diagnosis not present

## 2015-09-18 DIAGNOSIS — N2581 Secondary hyperparathyroidism of renal origin: Secondary | ICD-10-CM | POA: Diagnosis not present

## 2015-09-18 DIAGNOSIS — N186 End stage renal disease: Secondary | ICD-10-CM | POA: Diagnosis not present

## 2015-09-19 DIAGNOSIS — N2581 Secondary hyperparathyroidism of renal origin: Secondary | ICD-10-CM | POA: Diagnosis not present

## 2015-09-19 DIAGNOSIS — N186 End stage renal disease: Secondary | ICD-10-CM | POA: Diagnosis not present

## 2015-09-20 DIAGNOSIS — N2581 Secondary hyperparathyroidism of renal origin: Secondary | ICD-10-CM | POA: Diagnosis not present

## 2015-09-20 DIAGNOSIS — N186 End stage renal disease: Secondary | ICD-10-CM | POA: Diagnosis not present

## 2015-09-21 DIAGNOSIS — N186 End stage renal disease: Secondary | ICD-10-CM | POA: Diagnosis not present

## 2015-09-21 DIAGNOSIS — N2581 Secondary hyperparathyroidism of renal origin: Secondary | ICD-10-CM | POA: Diagnosis not present

## 2015-09-22 DIAGNOSIS — N2581 Secondary hyperparathyroidism of renal origin: Secondary | ICD-10-CM | POA: Diagnosis not present

## 2015-09-22 DIAGNOSIS — N186 End stage renal disease: Secondary | ICD-10-CM | POA: Diagnosis not present

## 2015-09-23 DIAGNOSIS — N2581 Secondary hyperparathyroidism of renal origin: Secondary | ICD-10-CM | POA: Diagnosis not present

## 2015-09-23 DIAGNOSIS — N186 End stage renal disease: Secondary | ICD-10-CM | POA: Diagnosis not present

## 2015-09-24 DIAGNOSIS — N186 End stage renal disease: Secondary | ICD-10-CM | POA: Diagnosis not present

## 2015-09-24 DIAGNOSIS — N2581 Secondary hyperparathyroidism of renal origin: Secondary | ICD-10-CM | POA: Diagnosis not present

## 2015-09-25 DIAGNOSIS — N186 End stage renal disease: Secondary | ICD-10-CM | POA: Diagnosis not present

## 2015-09-25 DIAGNOSIS — N2581 Secondary hyperparathyroidism of renal origin: Secondary | ICD-10-CM | POA: Diagnosis not present

## 2015-09-26 DIAGNOSIS — N2581 Secondary hyperparathyroidism of renal origin: Secondary | ICD-10-CM | POA: Diagnosis not present

## 2015-09-26 DIAGNOSIS — N186 End stage renal disease: Secondary | ICD-10-CM | POA: Diagnosis not present

## 2015-09-27 DIAGNOSIS — N186 End stage renal disease: Secondary | ICD-10-CM | POA: Diagnosis not present

## 2015-09-27 DIAGNOSIS — N2581 Secondary hyperparathyroidism of renal origin: Secondary | ICD-10-CM | POA: Diagnosis not present

## 2015-09-28 DIAGNOSIS — N2581 Secondary hyperparathyroidism of renal origin: Secondary | ICD-10-CM | POA: Diagnosis not present

## 2015-09-28 DIAGNOSIS — N186 End stage renal disease: Secondary | ICD-10-CM | POA: Diagnosis not present

## 2015-09-29 DIAGNOSIS — N186 End stage renal disease: Secondary | ICD-10-CM | POA: Diagnosis not present

## 2015-09-29 DIAGNOSIS — N2581 Secondary hyperparathyroidism of renal origin: Secondary | ICD-10-CM | POA: Diagnosis not present

## 2015-09-30 DIAGNOSIS — N186 End stage renal disease: Secondary | ICD-10-CM | POA: Diagnosis not present

## 2015-09-30 DIAGNOSIS — N2581 Secondary hyperparathyroidism of renal origin: Secondary | ICD-10-CM | POA: Diagnosis not present

## 2015-09-30 DIAGNOSIS — H26493 Other secondary cataract, bilateral: Secondary | ICD-10-CM | POA: Diagnosis not present

## 2015-10-01 DIAGNOSIS — N186 End stage renal disease: Secondary | ICD-10-CM | POA: Diagnosis not present

## 2015-10-01 DIAGNOSIS — N2581 Secondary hyperparathyroidism of renal origin: Secondary | ICD-10-CM | POA: Diagnosis not present

## 2015-10-02 DIAGNOSIS — N2581 Secondary hyperparathyroidism of renal origin: Secondary | ICD-10-CM | POA: Diagnosis not present

## 2015-10-02 DIAGNOSIS — N186 End stage renal disease: Secondary | ICD-10-CM | POA: Diagnosis not present

## 2015-10-03 DIAGNOSIS — N186 End stage renal disease: Secondary | ICD-10-CM | POA: Diagnosis not present

## 2015-10-03 DIAGNOSIS — N2581 Secondary hyperparathyroidism of renal origin: Secondary | ICD-10-CM | POA: Diagnosis not present

## 2015-10-04 DIAGNOSIS — N186 End stage renal disease: Secondary | ICD-10-CM | POA: Diagnosis not present

## 2015-10-04 DIAGNOSIS — N2581 Secondary hyperparathyroidism of renal origin: Secondary | ICD-10-CM | POA: Diagnosis not present

## 2015-10-05 DIAGNOSIS — N186 End stage renal disease: Secondary | ICD-10-CM | POA: Diagnosis not present

## 2015-10-05 DIAGNOSIS — N2581 Secondary hyperparathyroidism of renal origin: Secondary | ICD-10-CM | POA: Diagnosis not present

## 2015-10-06 DIAGNOSIS — N186 End stage renal disease: Secondary | ICD-10-CM | POA: Diagnosis not present

## 2015-10-06 DIAGNOSIS — N2581 Secondary hyperparathyroidism of renal origin: Secondary | ICD-10-CM | POA: Diagnosis not present

## 2015-10-07 DIAGNOSIS — N186 End stage renal disease: Secondary | ICD-10-CM | POA: Diagnosis not present

## 2015-10-07 DIAGNOSIS — N2581 Secondary hyperparathyroidism of renal origin: Secondary | ICD-10-CM | POA: Diagnosis not present

## 2015-10-08 DIAGNOSIS — N186 End stage renal disease: Secondary | ICD-10-CM | POA: Diagnosis not present

## 2015-10-08 DIAGNOSIS — N033 Chronic nephritic syndrome with diffuse mesangial proliferative glomerulonephritis: Secondary | ICD-10-CM | POA: Diagnosis not present

## 2015-10-08 DIAGNOSIS — Z992 Dependence on renal dialysis: Secondary | ICD-10-CM | POA: Diagnosis not present

## 2015-10-08 DIAGNOSIS — N2581 Secondary hyperparathyroidism of renal origin: Secondary | ICD-10-CM | POA: Diagnosis not present

## 2015-10-09 DIAGNOSIS — N186 End stage renal disease: Secondary | ICD-10-CM | POA: Diagnosis not present

## 2015-10-09 DIAGNOSIS — N2581 Secondary hyperparathyroidism of renal origin: Secondary | ICD-10-CM | POA: Diagnosis not present

## 2015-10-10 DIAGNOSIS — N186 End stage renal disease: Secondary | ICD-10-CM | POA: Diagnosis not present

## 2015-10-10 DIAGNOSIS — N2581 Secondary hyperparathyroidism of renal origin: Secondary | ICD-10-CM | POA: Diagnosis not present

## 2015-10-11 DIAGNOSIS — N186 End stage renal disease: Secondary | ICD-10-CM | POA: Diagnosis not present

## 2015-10-11 DIAGNOSIS — N2581 Secondary hyperparathyroidism of renal origin: Secondary | ICD-10-CM | POA: Diagnosis not present

## 2015-10-12 DIAGNOSIS — N186 End stage renal disease: Secondary | ICD-10-CM | POA: Diagnosis not present

## 2015-10-12 DIAGNOSIS — N2581 Secondary hyperparathyroidism of renal origin: Secondary | ICD-10-CM | POA: Diagnosis not present

## 2015-10-13 DIAGNOSIS — T85611A Breakdown (mechanical) of intraperitoneal dialysis catheter, initial encounter: Secondary | ICD-10-CM | POA: Insufficient documentation

## 2015-10-13 DIAGNOSIS — R109 Unspecified abdominal pain: Secondary | ICD-10-CM | POA: Diagnosis not present

## 2015-10-13 DIAGNOSIS — R1900 Intra-abdominal and pelvic swelling, mass and lump, unspecified site: Secondary | ICD-10-CM | POA: Insufficient documentation

## 2015-10-13 DIAGNOSIS — Z4902 Encounter for fitting and adjustment of peritoneal dialysis catheter: Secondary | ICD-10-CM | POA: Diagnosis not present

## 2015-10-13 DIAGNOSIS — I7090 Unspecified atherosclerosis: Secondary | ICD-10-CM | POA: Diagnosis not present

## 2015-10-13 DIAGNOSIS — N186 End stage renal disease: Secondary | ICD-10-CM | POA: Diagnosis not present

## 2015-10-13 DIAGNOSIS — Z452 Encounter for adjustment and management of vascular access device: Secondary | ICD-10-CM | POA: Diagnosis not present

## 2015-10-13 DIAGNOSIS — M7989 Other specified soft tissue disorders: Secondary | ICD-10-CM | POA: Insufficient documentation

## 2015-10-13 DIAGNOSIS — N2581 Secondary hyperparathyroidism of renal origin: Secondary | ICD-10-CM | POA: Diagnosis not present

## 2015-10-14 DIAGNOSIS — N186 End stage renal disease: Secondary | ICD-10-CM | POA: Diagnosis not present

## 2015-10-14 DIAGNOSIS — N2581 Secondary hyperparathyroidism of renal origin: Secondary | ICD-10-CM | POA: Diagnosis not present

## 2015-10-15 DIAGNOSIS — N186 End stage renal disease: Secondary | ICD-10-CM | POA: Diagnosis not present

## 2015-10-15 DIAGNOSIS — N2581 Secondary hyperparathyroidism of renal origin: Secondary | ICD-10-CM | POA: Diagnosis not present

## 2015-10-16 DIAGNOSIS — N186 End stage renal disease: Secondary | ICD-10-CM | POA: Diagnosis not present

## 2015-10-16 DIAGNOSIS — N2581 Secondary hyperparathyroidism of renal origin: Secondary | ICD-10-CM | POA: Diagnosis not present

## 2015-10-17 DIAGNOSIS — N2581 Secondary hyperparathyroidism of renal origin: Secondary | ICD-10-CM | POA: Diagnosis not present

## 2015-10-17 DIAGNOSIS — N186 End stage renal disease: Secondary | ICD-10-CM | POA: Diagnosis not present

## 2015-10-18 DIAGNOSIS — N2581 Secondary hyperparathyroidism of renal origin: Secondary | ICD-10-CM | POA: Diagnosis not present

## 2015-10-18 DIAGNOSIS — N186 End stage renal disease: Secondary | ICD-10-CM | POA: Diagnosis not present

## 2015-10-19 DIAGNOSIS — R188 Other ascites: Secondary | ICD-10-CM | POA: Diagnosis not present

## 2015-10-19 DIAGNOSIS — N186 End stage renal disease: Secondary | ICD-10-CM | POA: Diagnosis not present

## 2015-10-19 DIAGNOSIS — Z95828 Presence of other vascular implants and grafts: Secondary | ICD-10-CM | POA: Diagnosis not present

## 2015-10-19 DIAGNOSIS — K838 Other specified diseases of biliary tract: Secondary | ICD-10-CM | POA: Diagnosis not present

## 2015-10-19 DIAGNOSIS — N261 Atrophy of kidney (terminal): Secondary | ICD-10-CM | POA: Diagnosis not present

## 2015-10-19 DIAGNOSIS — T85611A Breakdown (mechanical) of intraperitoneal dialysis catheter, initial encounter: Secondary | ICD-10-CM | POA: Diagnosis not present

## 2015-10-19 DIAGNOSIS — N2581 Secondary hyperparathyroidism of renal origin: Secondary | ICD-10-CM | POA: Diagnosis not present

## 2015-10-19 DIAGNOSIS — K7689 Other specified diseases of liver: Secondary | ICD-10-CM | POA: Diagnosis not present

## 2015-10-20 DIAGNOSIS — N186 End stage renal disease: Secondary | ICD-10-CM | POA: Diagnosis not present

## 2015-10-20 DIAGNOSIS — N2581 Secondary hyperparathyroidism of renal origin: Secondary | ICD-10-CM | POA: Diagnosis not present

## 2015-10-21 DIAGNOSIS — N186 End stage renal disease: Secondary | ICD-10-CM | POA: Diagnosis not present

## 2015-10-21 DIAGNOSIS — N2581 Secondary hyperparathyroidism of renal origin: Secondary | ICD-10-CM | POA: Diagnosis not present

## 2015-10-22 DIAGNOSIS — N186 End stage renal disease: Secondary | ICD-10-CM | POA: Diagnosis not present

## 2015-10-22 DIAGNOSIS — N2581 Secondary hyperparathyroidism of renal origin: Secondary | ICD-10-CM | POA: Diagnosis not present

## 2015-10-23 DIAGNOSIS — N2581 Secondary hyperparathyroidism of renal origin: Secondary | ICD-10-CM | POA: Diagnosis not present

## 2015-10-23 DIAGNOSIS — N186 End stage renal disease: Secondary | ICD-10-CM | POA: Diagnosis not present

## 2015-10-24 DIAGNOSIS — N2581 Secondary hyperparathyroidism of renal origin: Secondary | ICD-10-CM | POA: Diagnosis not present

## 2015-10-24 DIAGNOSIS — N186 End stage renal disease: Secondary | ICD-10-CM | POA: Diagnosis not present

## 2015-10-25 ENCOUNTER — Ambulatory Visit (INDEPENDENT_AMBULATORY_CARE_PROVIDER_SITE_OTHER): Payer: Medicare Other | Admitting: Podiatry

## 2015-10-25 DIAGNOSIS — M79674 Pain in right toe(s): Secondary | ICD-10-CM

## 2015-10-25 DIAGNOSIS — M79676 Pain in unspecified toe(s): Secondary | ICD-10-CM

## 2015-10-25 DIAGNOSIS — E1140 Type 2 diabetes mellitus with diabetic neuropathy, unspecified: Secondary | ICD-10-CM | POA: Diagnosis not present

## 2015-10-25 DIAGNOSIS — N2581 Secondary hyperparathyroidism of renal origin: Secondary | ICD-10-CM | POA: Diagnosis not present

## 2015-10-25 DIAGNOSIS — B351 Tinea unguium: Secondary | ICD-10-CM

## 2015-10-25 DIAGNOSIS — N186 End stage renal disease: Secondary | ICD-10-CM | POA: Diagnosis not present

## 2015-10-25 NOTE — Progress Notes (Signed)
Patient ID: Linda Jordan, female   DOB: 10/29/1929, 82 y.o.   MRN: [id number] Complaint:  Visit Type: Patient returns to my office for continued preventative foot care services. Complaint: Patient states" my nails have grown long and thick and become painful to walk and wear shoes" Patient has been diagnosed with DM with no complications. He presents for preventative foot care services. No changes to ROS  Podiatric Exam: Vascular: dorsalis pedis and posterior tibial pulses are palpable bilateral. Capillary return is immediate. Temperature gradient is WNL. Skin turgor WNL  Sensorium: Normal Semmes Weinstein monofilament test. Normal tactile sensation bilaterally. Nail Exam: Pt has thick disfigured discolored nails with subungual debris noted bilateral entire nail hallux through fifth toenails Ulcer Exam: There is no evidence of ulcer or pre-ulcerative changes or infection. Orthopedic Exam: Muscle tone and strength are WNL. No limitations in general ROM. No crepitus or effusions noted. Foot type and digits show no abnormalities. Bony prominences are unremarkable. Toe contractures.  No pain. Skin: No Porokeratosis. No infection or ulcers  Diagnosis:  Tinea unguium, Pain in right toe, pain in left toes  Treatment & Plan Procedures and Treatment: Consent by patient was obtained for treatment procedures. The patient understood the discussion of treatment and procedures well. All questions were answered thoroughly reviewed. Debridement of mycotic and hypertrophic toenails, 1 through 5 bilateral and clearing of subungual debris. No ulceration, no infection noted.  Return Visit-Office Procedure: Patient instructed to return to the office for a follow up visit 3 months for continued evaluation and treatment.   Trystin Hargrove DPM 

## 2015-10-26 DIAGNOSIS — N2581 Secondary hyperparathyroidism of renal origin: Secondary | ICD-10-CM | POA: Diagnosis not present

## 2015-10-26 DIAGNOSIS — M7989 Other specified soft tissue disorders: Secondary | ICD-10-CM | POA: Diagnosis not present

## 2015-10-26 DIAGNOSIS — N186 End stage renal disease: Secondary | ICD-10-CM | POA: Diagnosis not present

## 2015-10-27 DIAGNOSIS — N2581 Secondary hyperparathyroidism of renal origin: Secondary | ICD-10-CM | POA: Diagnosis not present

## 2015-10-27 DIAGNOSIS — N186 End stage renal disease: Secondary | ICD-10-CM | POA: Diagnosis not present

## 2015-10-28 DIAGNOSIS — I82409 Acute embolism and thrombosis of unspecified deep veins of unspecified lower extremity: Secondary | ICD-10-CM | POA: Diagnosis not present

## 2015-10-28 DIAGNOSIS — M545 Low back pain: Secondary | ICD-10-CM | POA: Diagnosis not present

## 2015-10-28 DIAGNOSIS — E785 Hyperlipidemia, unspecified: Secondary | ICD-10-CM | POA: Diagnosis not present

## 2015-10-28 DIAGNOSIS — N2581 Secondary hyperparathyroidism of renal origin: Secondary | ICD-10-CM | POA: Diagnosis not present

## 2015-10-28 DIAGNOSIS — R600 Localized edema: Secondary | ICD-10-CM | POA: Diagnosis not present

## 2015-10-28 DIAGNOSIS — N186 End stage renal disease: Secondary | ICD-10-CM | POA: Diagnosis not present

## 2015-10-28 DIAGNOSIS — M159 Polyosteoarthritis, unspecified: Secondary | ICD-10-CM | POA: Diagnosis not present

## 2015-10-28 DIAGNOSIS — J309 Allergic rhinitis, unspecified: Secondary | ICD-10-CM | POA: Diagnosis not present

## 2015-10-28 DIAGNOSIS — J449 Chronic obstructive pulmonary disease, unspecified: Secondary | ICD-10-CM | POA: Diagnosis not present

## 2015-10-28 DIAGNOSIS — E1121 Type 2 diabetes mellitus with diabetic nephropathy: Secondary | ICD-10-CM | POA: Diagnosis not present

## 2015-10-28 DIAGNOSIS — Z9181 History of falling: Secondary | ICD-10-CM | POA: Diagnosis not present

## 2015-10-28 DIAGNOSIS — K219 Gastro-esophageal reflux disease without esophagitis: Secondary | ICD-10-CM | POA: Diagnosis not present

## 2015-10-28 DIAGNOSIS — I10 Essential (primary) hypertension: Secondary | ICD-10-CM | POA: Diagnosis not present

## 2015-10-28 DIAGNOSIS — B0229 Other postherpetic nervous system involvement: Secondary | ICD-10-CM | POA: Diagnosis not present

## 2015-10-29 DIAGNOSIS — N2581 Secondary hyperparathyroidism of renal origin: Secondary | ICD-10-CM | POA: Diagnosis not present

## 2015-10-29 DIAGNOSIS — N186 End stage renal disease: Secondary | ICD-10-CM | POA: Diagnosis not present

## 2015-10-30 DIAGNOSIS — N186 End stage renal disease: Secondary | ICD-10-CM | POA: Diagnosis not present

## 2015-10-30 DIAGNOSIS — N2581 Secondary hyperparathyroidism of renal origin: Secondary | ICD-10-CM | POA: Diagnosis not present

## 2015-10-31 DIAGNOSIS — N186 End stage renal disease: Secondary | ICD-10-CM | POA: Diagnosis not present

## 2015-10-31 DIAGNOSIS — N2581 Secondary hyperparathyroidism of renal origin: Secondary | ICD-10-CM | POA: Diagnosis not present

## 2015-11-01 DIAGNOSIS — N2581 Secondary hyperparathyroidism of renal origin: Secondary | ICD-10-CM | POA: Diagnosis not present

## 2015-11-01 DIAGNOSIS — N186 End stage renal disease: Secondary | ICD-10-CM | POA: Diagnosis not present

## 2015-11-02 DIAGNOSIS — N186 End stage renal disease: Secondary | ICD-10-CM | POA: Diagnosis not present

## 2015-11-02 DIAGNOSIS — N2581 Secondary hyperparathyroidism of renal origin: Secondary | ICD-10-CM | POA: Diagnosis not present

## 2015-11-03 DIAGNOSIS — N2581 Secondary hyperparathyroidism of renal origin: Secondary | ICD-10-CM | POA: Diagnosis not present

## 2015-11-03 DIAGNOSIS — N186 End stage renal disease: Secondary | ICD-10-CM | POA: Diagnosis not present

## 2015-11-04 DIAGNOSIS — N2581 Secondary hyperparathyroidism of renal origin: Secondary | ICD-10-CM | POA: Diagnosis not present

## 2015-11-04 DIAGNOSIS — N186 End stage renal disease: Secondary | ICD-10-CM | POA: Diagnosis not present

## 2015-11-05 DIAGNOSIS — N2581 Secondary hyperparathyroidism of renal origin: Secondary | ICD-10-CM | POA: Diagnosis not present

## 2015-11-05 DIAGNOSIS — N186 End stage renal disease: Secondary | ICD-10-CM | POA: Diagnosis not present

## 2015-11-06 DIAGNOSIS — N2581 Secondary hyperparathyroidism of renal origin: Secondary | ICD-10-CM | POA: Diagnosis not present

## 2015-11-06 DIAGNOSIS — N186 End stage renal disease: Secondary | ICD-10-CM | POA: Diagnosis not present

## 2015-11-07 DIAGNOSIS — Z992 Dependence on renal dialysis: Secondary | ICD-10-CM | POA: Diagnosis not present

## 2015-11-07 DIAGNOSIS — N186 End stage renal disease: Secondary | ICD-10-CM | POA: Diagnosis not present

## 2015-11-07 DIAGNOSIS — N2581 Secondary hyperparathyroidism of renal origin: Secondary | ICD-10-CM | POA: Diagnosis not present

## 2015-11-07 DIAGNOSIS — N033 Chronic nephritic syndrome with diffuse mesangial proliferative glomerulonephritis: Secondary | ICD-10-CM | POA: Diagnosis not present

## 2015-11-08 DIAGNOSIS — N186 End stage renal disease: Secondary | ICD-10-CM | POA: Diagnosis not present

## 2015-11-08 DIAGNOSIS — N033 Chronic nephritic syndrome with diffuse mesangial proliferative glomerulonephritis: Secondary | ICD-10-CM | POA: Diagnosis not present

## 2015-11-08 DIAGNOSIS — Z992 Dependence on renal dialysis: Secondary | ICD-10-CM | POA: Diagnosis not present

## 2015-11-08 DIAGNOSIS — N2581 Secondary hyperparathyroidism of renal origin: Secondary | ICD-10-CM | POA: Diagnosis not present

## 2015-11-08 DIAGNOSIS — K659 Peritonitis, unspecified: Secondary | ICD-10-CM | POA: Diagnosis not present

## 2015-11-09 DIAGNOSIS — Z992 Dependence on renal dialysis: Secondary | ICD-10-CM | POA: Diagnosis not present

## 2015-11-09 DIAGNOSIS — K659 Peritonitis, unspecified: Secondary | ICD-10-CM | POA: Diagnosis not present

## 2015-11-09 DIAGNOSIS — N033 Chronic nephritic syndrome with diffuse mesangial proliferative glomerulonephritis: Secondary | ICD-10-CM | POA: Diagnosis not present

## 2015-11-09 DIAGNOSIS — N2581 Secondary hyperparathyroidism of renal origin: Secondary | ICD-10-CM | POA: Diagnosis not present

## 2015-11-09 DIAGNOSIS — N186 End stage renal disease: Secondary | ICD-10-CM | POA: Diagnosis not present

## 2015-11-10 DIAGNOSIS — K659 Peritonitis, unspecified: Secondary | ICD-10-CM | POA: Diagnosis not present

## 2015-11-10 DIAGNOSIS — N033 Chronic nephritic syndrome with diffuse mesangial proliferative glomerulonephritis: Secondary | ICD-10-CM | POA: Diagnosis not present

## 2015-11-10 DIAGNOSIS — Z992 Dependence on renal dialysis: Secondary | ICD-10-CM | POA: Diagnosis not present

## 2015-11-10 DIAGNOSIS — N186 End stage renal disease: Secondary | ICD-10-CM | POA: Diagnosis not present

## 2015-11-10 DIAGNOSIS — N2581 Secondary hyperparathyroidism of renal origin: Secondary | ICD-10-CM | POA: Diagnosis not present

## 2015-11-11 DIAGNOSIS — N033 Chronic nephritic syndrome with diffuse mesangial proliferative glomerulonephritis: Secondary | ICD-10-CM | POA: Diagnosis not present

## 2015-11-11 DIAGNOSIS — K659 Peritonitis, unspecified: Secondary | ICD-10-CM | POA: Diagnosis not present

## 2015-11-11 DIAGNOSIS — N186 End stage renal disease: Secondary | ICD-10-CM | POA: Diagnosis not present

## 2015-11-11 DIAGNOSIS — N2581 Secondary hyperparathyroidism of renal origin: Secondary | ICD-10-CM | POA: Diagnosis not present

## 2015-11-11 DIAGNOSIS — Z992 Dependence on renal dialysis: Secondary | ICD-10-CM | POA: Diagnosis not present

## 2015-11-12 DIAGNOSIS — Z992 Dependence on renal dialysis: Secondary | ICD-10-CM | POA: Diagnosis not present

## 2015-11-12 DIAGNOSIS — N186 End stage renal disease: Secondary | ICD-10-CM | POA: Diagnosis not present

## 2015-11-12 DIAGNOSIS — K659 Peritonitis, unspecified: Secondary | ICD-10-CM | POA: Diagnosis not present

## 2015-11-12 DIAGNOSIS — N2581 Secondary hyperparathyroidism of renal origin: Secondary | ICD-10-CM | POA: Diagnosis not present

## 2015-11-12 DIAGNOSIS — N033 Chronic nephritic syndrome with diffuse mesangial proliferative glomerulonephritis: Secondary | ICD-10-CM | POA: Diagnosis not present

## 2015-11-13 DIAGNOSIS — Z992 Dependence on renal dialysis: Secondary | ICD-10-CM | POA: Diagnosis not present

## 2015-11-13 DIAGNOSIS — N186 End stage renal disease: Secondary | ICD-10-CM | POA: Diagnosis not present

## 2015-11-13 DIAGNOSIS — K659 Peritonitis, unspecified: Secondary | ICD-10-CM | POA: Diagnosis not present

## 2015-11-13 DIAGNOSIS — N2581 Secondary hyperparathyroidism of renal origin: Secondary | ICD-10-CM | POA: Diagnosis not present

## 2015-11-13 DIAGNOSIS — N033 Chronic nephritic syndrome with diffuse mesangial proliferative glomerulonephritis: Secondary | ICD-10-CM | POA: Diagnosis not present

## 2015-11-14 DIAGNOSIS — N033 Chronic nephritic syndrome with diffuse mesangial proliferative glomerulonephritis: Secondary | ICD-10-CM | POA: Diagnosis not present

## 2015-11-14 DIAGNOSIS — Z992 Dependence on renal dialysis: Secondary | ICD-10-CM | POA: Diagnosis not present

## 2015-11-14 DIAGNOSIS — N186 End stage renal disease: Secondary | ICD-10-CM | POA: Diagnosis not present

## 2015-11-14 DIAGNOSIS — N2581 Secondary hyperparathyroidism of renal origin: Secondary | ICD-10-CM | POA: Diagnosis not present

## 2015-11-14 DIAGNOSIS — K659 Peritonitis, unspecified: Secondary | ICD-10-CM | POA: Diagnosis not present

## 2015-11-15 DIAGNOSIS — N2581 Secondary hyperparathyroidism of renal origin: Secondary | ICD-10-CM | POA: Diagnosis not present

## 2015-11-15 DIAGNOSIS — Z992 Dependence on renal dialysis: Secondary | ICD-10-CM | POA: Diagnosis not present

## 2015-11-15 DIAGNOSIS — N186 End stage renal disease: Secondary | ICD-10-CM | POA: Diagnosis not present

## 2015-11-15 DIAGNOSIS — K659 Peritonitis, unspecified: Secondary | ICD-10-CM | POA: Diagnosis not present

## 2015-11-15 DIAGNOSIS — N033 Chronic nephritic syndrome with diffuse mesangial proliferative glomerulonephritis: Secondary | ICD-10-CM | POA: Diagnosis not present

## 2015-11-16 DIAGNOSIS — N2581 Secondary hyperparathyroidism of renal origin: Secondary | ICD-10-CM | POA: Diagnosis not present

## 2015-11-16 DIAGNOSIS — N186 End stage renal disease: Secondary | ICD-10-CM | POA: Diagnosis not present

## 2015-11-16 DIAGNOSIS — K659 Peritonitis, unspecified: Secondary | ICD-10-CM | POA: Diagnosis not present

## 2015-11-16 DIAGNOSIS — N033 Chronic nephritic syndrome with diffuse mesangial proliferative glomerulonephritis: Secondary | ICD-10-CM | POA: Diagnosis not present

## 2015-11-16 DIAGNOSIS — Z992 Dependence on renal dialysis: Secondary | ICD-10-CM | POA: Diagnosis not present

## 2015-11-17 DIAGNOSIS — K219 Gastro-esophageal reflux disease without esophagitis: Secondary | ICD-10-CM | POA: Diagnosis present

## 2015-11-17 DIAGNOSIS — I120 Hypertensive chronic kidney disease with stage 5 chronic kidney disease or end stage renal disease: Secondary | ICD-10-CM | POA: Diagnosis not present

## 2015-11-17 DIAGNOSIS — Z4902 Encounter for fitting and adjustment of peritoneal dialysis catheter: Secondary | ICD-10-CM | POA: Diagnosis not present

## 2015-11-17 DIAGNOSIS — R188 Other ascites: Secondary | ICD-10-CM | POA: Diagnosis not present

## 2015-11-17 DIAGNOSIS — N2581 Secondary hyperparathyroidism of renal origin: Secondary | ICD-10-CM | POA: Diagnosis not present

## 2015-11-17 DIAGNOSIS — Z7982 Long term (current) use of aspirin: Secondary | ICD-10-CM | POA: Diagnosis not present

## 2015-11-17 DIAGNOSIS — N186 End stage renal disease: Secondary | ICD-10-CM | POA: Diagnosis present

## 2015-11-17 DIAGNOSIS — E039 Hypothyroidism, unspecified: Secondary | ICD-10-CM | POA: Diagnosis not present

## 2015-11-17 DIAGNOSIS — E785 Hyperlipidemia, unspecified: Secondary | ICD-10-CM | POA: Diagnosis present

## 2015-11-17 DIAGNOSIS — Z992 Dependence on renal dialysis: Secondary | ICD-10-CM | POA: Diagnosis not present

## 2015-11-17 DIAGNOSIS — Z86718 Personal history of other venous thrombosis and embolism: Secondary | ICD-10-CM | POA: Diagnosis not present

## 2015-11-17 DIAGNOSIS — J9811 Atelectasis: Secondary | ICD-10-CM | POA: Diagnosis not present

## 2015-11-17 DIAGNOSIS — R000 Tachycardia, unspecified: Secondary | ICD-10-CM | POA: Diagnosis not present

## 2015-11-17 DIAGNOSIS — R109 Unspecified abdominal pain: Secondary | ICD-10-CM | POA: Diagnosis not present

## 2015-11-17 DIAGNOSIS — K7689 Other specified diseases of liver: Secondary | ICD-10-CM | POA: Diagnosis not present

## 2015-11-17 DIAGNOSIS — D631 Anemia in chronic kidney disease: Secondary | ICD-10-CM | POA: Diagnosis not present

## 2015-11-17 DIAGNOSIS — R1084 Generalized abdominal pain: Secondary | ICD-10-CM | POA: Diagnosis not present

## 2015-11-17 DIAGNOSIS — K5900 Constipation, unspecified: Secondary | ICD-10-CM | POA: Diagnosis not present

## 2015-11-17 DIAGNOSIS — K567 Ileus, unspecified: Secondary | ICD-10-CM | POA: Diagnosis present

## 2015-11-17 DIAGNOSIS — R509 Fever, unspecified: Secondary | ICD-10-CM | POA: Diagnosis not present

## 2015-11-17 DIAGNOSIS — K560 Paralytic ileus: Secondary | ICD-10-CM | POA: Diagnosis not present

## 2015-11-17 DIAGNOSIS — F329 Major depressive disorder, single episode, unspecified: Secondary | ICD-10-CM | POA: Diagnosis not present

## 2015-11-17 DIAGNOSIS — Z4682 Encounter for fitting and adjustment of non-vascular catheter: Secondary | ICD-10-CM | POA: Diagnosis not present

## 2015-11-17 DIAGNOSIS — K5730 Diverticulosis of large intestine without perforation or abscess without bleeding: Secondary | ICD-10-CM | POA: Diagnosis not present

## 2015-11-17 DIAGNOSIS — I878 Other specified disorders of veins: Secondary | ICD-10-CM | POA: Diagnosis not present

## 2015-11-17 DIAGNOSIS — M1990 Unspecified osteoarthritis, unspecified site: Secondary | ICD-10-CM | POA: Diagnosis present

## 2015-11-17 DIAGNOSIS — Z882 Allergy status to sulfonamides status: Secondary | ICD-10-CM | POA: Diagnosis not present

## 2015-11-17 DIAGNOSIS — E875 Hyperkalemia: Secondary | ICD-10-CM | POA: Diagnosis not present

## 2015-11-17 DIAGNOSIS — K659 Peritonitis, unspecified: Secondary | ICD-10-CM | POA: Diagnosis not present

## 2015-11-17 DIAGNOSIS — Z79811 Long term (current) use of aromatase inhibitors: Secondary | ICD-10-CM | POA: Diagnosis not present

## 2015-11-17 DIAGNOSIS — A419 Sepsis, unspecified organism: Secondary | ICD-10-CM | POA: Diagnosis not present

## 2015-11-17 DIAGNOSIS — R0989 Other specified symptoms and signs involving the circulatory and respiratory systems: Secondary | ICD-10-CM | POA: Diagnosis not present

## 2015-11-17 DIAGNOSIS — K6389 Other specified diseases of intestine: Secondary | ICD-10-CM | POA: Diagnosis not present

## 2015-11-17 DIAGNOSIS — K589 Irritable bowel syndrome without diarrhea: Secondary | ICD-10-CM | POA: Diagnosis present

## 2015-11-17 DIAGNOSIS — R918 Other nonspecific abnormal finding of lung field: Secondary | ICD-10-CM | POA: Diagnosis not present

## 2015-11-17 DIAGNOSIS — Z79891 Long term (current) use of opiate analgesic: Secondary | ICD-10-CM | POA: Diagnosis not present

## 2015-11-17 DIAGNOSIS — K658 Other peritonitis: Secondary | ICD-10-CM | POA: Diagnosis not present

## 2015-12-01 DIAGNOSIS — N2581 Secondary hyperparathyroidism of renal origin: Secondary | ICD-10-CM | POA: Diagnosis not present

## 2015-12-01 DIAGNOSIS — K659 Peritonitis, unspecified: Secondary | ICD-10-CM | POA: Diagnosis not present

## 2015-12-01 DIAGNOSIS — N186 End stage renal disease: Secondary | ICD-10-CM | POA: Diagnosis not present

## 2015-12-02 DIAGNOSIS — Z992 Dependence on renal dialysis: Secondary | ICD-10-CM | POA: Diagnosis not present

## 2015-12-02 DIAGNOSIS — K659 Peritonitis, unspecified: Secondary | ICD-10-CM | POA: Diagnosis not present

## 2015-12-02 DIAGNOSIS — N033 Chronic nephritic syndrome with diffuse mesangial proliferative glomerulonephritis: Secondary | ICD-10-CM | POA: Diagnosis not present

## 2015-12-02 DIAGNOSIS — N186 End stage renal disease: Secondary | ICD-10-CM | POA: Diagnosis not present

## 2015-12-02 DIAGNOSIS — N2581 Secondary hyperparathyroidism of renal origin: Secondary | ICD-10-CM | POA: Diagnosis not present

## 2015-12-03 DIAGNOSIS — N2581 Secondary hyperparathyroidism of renal origin: Secondary | ICD-10-CM | POA: Diagnosis not present

## 2015-12-03 DIAGNOSIS — K659 Peritonitis, unspecified: Secondary | ICD-10-CM | POA: Diagnosis not present

## 2015-12-03 DIAGNOSIS — Z992 Dependence on renal dialysis: Secondary | ICD-10-CM | POA: Diagnosis not present

## 2015-12-03 DIAGNOSIS — N033 Chronic nephritic syndrome with diffuse mesangial proliferative glomerulonephritis: Secondary | ICD-10-CM | POA: Diagnosis not present

## 2015-12-03 DIAGNOSIS — N186 End stage renal disease: Secondary | ICD-10-CM | POA: Diagnosis not present

## 2015-12-04 DIAGNOSIS — N186 End stage renal disease: Secondary | ICD-10-CM | POA: Diagnosis not present

## 2015-12-04 DIAGNOSIS — N033 Chronic nephritic syndrome with diffuse mesangial proliferative glomerulonephritis: Secondary | ICD-10-CM | POA: Diagnosis not present

## 2015-12-04 DIAGNOSIS — K659 Peritonitis, unspecified: Secondary | ICD-10-CM | POA: Diagnosis not present

## 2015-12-04 DIAGNOSIS — N2581 Secondary hyperparathyroidism of renal origin: Secondary | ICD-10-CM | POA: Diagnosis not present

## 2015-12-04 DIAGNOSIS — Z992 Dependence on renal dialysis: Secondary | ICD-10-CM | POA: Diagnosis not present

## 2015-12-05 DIAGNOSIS — K659 Peritonitis, unspecified: Secondary | ICD-10-CM | POA: Diagnosis not present

## 2015-12-05 DIAGNOSIS — N186 End stage renal disease: Secondary | ICD-10-CM | POA: Diagnosis not present

## 2015-12-05 DIAGNOSIS — N2581 Secondary hyperparathyroidism of renal origin: Secondary | ICD-10-CM | POA: Diagnosis not present

## 2015-12-05 DIAGNOSIS — Z992 Dependence on renal dialysis: Secondary | ICD-10-CM | POA: Diagnosis not present

## 2015-12-05 DIAGNOSIS — N033 Chronic nephritic syndrome with diffuse mesangial proliferative glomerulonephritis: Secondary | ICD-10-CM | POA: Diagnosis not present

## 2015-12-06 DIAGNOSIS — K659 Peritonitis, unspecified: Secondary | ICD-10-CM | POA: Diagnosis not present

## 2015-12-06 DIAGNOSIS — N033 Chronic nephritic syndrome with diffuse mesangial proliferative glomerulonephritis: Secondary | ICD-10-CM | POA: Diagnosis not present

## 2015-12-06 DIAGNOSIS — N186 End stage renal disease: Secondary | ICD-10-CM | POA: Diagnosis not present

## 2015-12-06 DIAGNOSIS — Z992 Dependence on renal dialysis: Secondary | ICD-10-CM | POA: Diagnosis not present

## 2015-12-06 DIAGNOSIS — N2581 Secondary hyperparathyroidism of renal origin: Secondary | ICD-10-CM | POA: Diagnosis not present

## 2015-12-07 DIAGNOSIS — N033 Chronic nephritic syndrome with diffuse mesangial proliferative glomerulonephritis: Secondary | ICD-10-CM | POA: Diagnosis not present

## 2015-12-07 DIAGNOSIS — N186 End stage renal disease: Secondary | ICD-10-CM | POA: Diagnosis not present

## 2015-12-07 DIAGNOSIS — Z992 Dependence on renal dialysis: Secondary | ICD-10-CM | POA: Diagnosis not present

## 2015-12-07 DIAGNOSIS — N2581 Secondary hyperparathyroidism of renal origin: Secondary | ICD-10-CM | POA: Diagnosis not present

## 2015-12-07 DIAGNOSIS — K659 Peritonitis, unspecified: Secondary | ICD-10-CM | POA: Diagnosis not present

## 2015-12-08 DIAGNOSIS — N186 End stage renal disease: Secondary | ICD-10-CM | POA: Diagnosis not present

## 2015-12-08 DIAGNOSIS — Z992 Dependence on renal dialysis: Secondary | ICD-10-CM | POA: Diagnosis not present

## 2015-12-08 DIAGNOSIS — N033 Chronic nephritic syndrome with diffuse mesangial proliferative glomerulonephritis: Secondary | ICD-10-CM | POA: Diagnosis not present

## 2015-12-08 DIAGNOSIS — K659 Peritonitis, unspecified: Secondary | ICD-10-CM | POA: Diagnosis not present

## 2015-12-08 DIAGNOSIS — N2581 Secondary hyperparathyroidism of renal origin: Secondary | ICD-10-CM | POA: Diagnosis not present

## 2015-12-09 DIAGNOSIS — D631 Anemia in chronic kidney disease: Secondary | ICD-10-CM | POA: Diagnosis not present

## 2015-12-09 DIAGNOSIS — N186 End stage renal disease: Secondary | ICD-10-CM | POA: Diagnosis not present

## 2015-12-09 DIAGNOSIS — K659 Peritonitis, unspecified: Secondary | ICD-10-CM | POA: Diagnosis not present

## 2015-12-09 DIAGNOSIS — N2581 Secondary hyperparathyroidism of renal origin: Secondary | ICD-10-CM | POA: Diagnosis not present

## 2015-12-09 DIAGNOSIS — D509 Iron deficiency anemia, unspecified: Secondary | ICD-10-CM | POA: Diagnosis not present

## 2015-12-10 DIAGNOSIS — D509 Iron deficiency anemia, unspecified: Secondary | ICD-10-CM | POA: Diagnosis not present

## 2015-12-10 DIAGNOSIS — N2581 Secondary hyperparathyroidism of renal origin: Secondary | ICD-10-CM | POA: Diagnosis not present

## 2015-12-10 DIAGNOSIS — K659 Peritonitis, unspecified: Secondary | ICD-10-CM | POA: Diagnosis not present

## 2015-12-10 DIAGNOSIS — D631 Anemia in chronic kidney disease: Secondary | ICD-10-CM | POA: Diagnosis not present

## 2015-12-10 DIAGNOSIS — N186 End stage renal disease: Secondary | ICD-10-CM | POA: Diagnosis not present

## 2015-12-11 DIAGNOSIS — K659 Peritonitis, unspecified: Secondary | ICD-10-CM | POA: Diagnosis not present

## 2015-12-11 DIAGNOSIS — D509 Iron deficiency anemia, unspecified: Secondary | ICD-10-CM | POA: Diagnosis not present

## 2015-12-11 DIAGNOSIS — N186 End stage renal disease: Secondary | ICD-10-CM | POA: Diagnosis not present

## 2015-12-11 DIAGNOSIS — N2581 Secondary hyperparathyroidism of renal origin: Secondary | ICD-10-CM | POA: Diagnosis not present

## 2015-12-11 DIAGNOSIS — D631 Anemia in chronic kidney disease: Secondary | ICD-10-CM | POA: Diagnosis not present

## 2015-12-12 DIAGNOSIS — D631 Anemia in chronic kidney disease: Secondary | ICD-10-CM | POA: Diagnosis not present

## 2015-12-12 DIAGNOSIS — N2581 Secondary hyperparathyroidism of renal origin: Secondary | ICD-10-CM | POA: Diagnosis not present

## 2015-12-12 DIAGNOSIS — D509 Iron deficiency anemia, unspecified: Secondary | ICD-10-CM | POA: Diagnosis not present

## 2015-12-12 DIAGNOSIS — N186 End stage renal disease: Secondary | ICD-10-CM | POA: Diagnosis not present

## 2015-12-12 DIAGNOSIS — K659 Peritonitis, unspecified: Secondary | ICD-10-CM | POA: Diagnosis not present

## 2015-12-13 DIAGNOSIS — D631 Anemia in chronic kidney disease: Secondary | ICD-10-CM | POA: Diagnosis not present

## 2015-12-13 DIAGNOSIS — K659 Peritonitis, unspecified: Secondary | ICD-10-CM | POA: Diagnosis not present

## 2015-12-13 DIAGNOSIS — N2581 Secondary hyperparathyroidism of renal origin: Secondary | ICD-10-CM | POA: Diagnosis not present

## 2015-12-13 DIAGNOSIS — N186 End stage renal disease: Secondary | ICD-10-CM | POA: Diagnosis not present

## 2015-12-13 DIAGNOSIS — D509 Iron deficiency anemia, unspecified: Secondary | ICD-10-CM | POA: Diagnosis not present

## 2015-12-14 DIAGNOSIS — D631 Anemia in chronic kidney disease: Secondary | ICD-10-CM | POA: Diagnosis not present

## 2015-12-14 DIAGNOSIS — N186 End stage renal disease: Secondary | ICD-10-CM | POA: Diagnosis not present

## 2015-12-14 DIAGNOSIS — N2581 Secondary hyperparathyroidism of renal origin: Secondary | ICD-10-CM | POA: Diagnosis not present

## 2015-12-14 DIAGNOSIS — D509 Iron deficiency anemia, unspecified: Secondary | ICD-10-CM | POA: Diagnosis not present

## 2015-12-14 DIAGNOSIS — K659 Peritonitis, unspecified: Secondary | ICD-10-CM | POA: Diagnosis not present

## 2015-12-15 DIAGNOSIS — D509 Iron deficiency anemia, unspecified: Secondary | ICD-10-CM | POA: Diagnosis not present

## 2015-12-15 DIAGNOSIS — N2581 Secondary hyperparathyroidism of renal origin: Secondary | ICD-10-CM | POA: Diagnosis not present

## 2015-12-15 DIAGNOSIS — K659 Peritonitis, unspecified: Secondary | ICD-10-CM | POA: Diagnosis not present

## 2015-12-15 DIAGNOSIS — N186 End stage renal disease: Secondary | ICD-10-CM | POA: Diagnosis not present

## 2015-12-15 DIAGNOSIS — D631 Anemia in chronic kidney disease: Secondary | ICD-10-CM | POA: Diagnosis not present

## 2015-12-16 DIAGNOSIS — N186 End stage renal disease: Secondary | ICD-10-CM | POA: Diagnosis not present

## 2015-12-16 DIAGNOSIS — K659 Peritonitis, unspecified: Secondary | ICD-10-CM | POA: Diagnosis not present

## 2015-12-16 DIAGNOSIS — N2581 Secondary hyperparathyroidism of renal origin: Secondary | ICD-10-CM | POA: Diagnosis not present

## 2015-12-16 DIAGNOSIS — D509 Iron deficiency anemia, unspecified: Secondary | ICD-10-CM | POA: Diagnosis not present

## 2015-12-16 DIAGNOSIS — D631 Anemia in chronic kidney disease: Secondary | ICD-10-CM | POA: Diagnosis not present

## 2015-12-17 DIAGNOSIS — K659 Peritonitis, unspecified: Secondary | ICD-10-CM | POA: Diagnosis not present

## 2015-12-17 DIAGNOSIS — N2581 Secondary hyperparathyroidism of renal origin: Secondary | ICD-10-CM | POA: Diagnosis not present

## 2015-12-17 DIAGNOSIS — D509 Iron deficiency anemia, unspecified: Secondary | ICD-10-CM | POA: Diagnosis not present

## 2015-12-17 DIAGNOSIS — D631 Anemia in chronic kidney disease: Secondary | ICD-10-CM | POA: Diagnosis not present

## 2015-12-17 DIAGNOSIS — N186 End stage renal disease: Secondary | ICD-10-CM | POA: Diagnosis not present

## 2015-12-18 DIAGNOSIS — K659 Peritonitis, unspecified: Secondary | ICD-10-CM | POA: Diagnosis not present

## 2015-12-18 DIAGNOSIS — D509 Iron deficiency anemia, unspecified: Secondary | ICD-10-CM | POA: Diagnosis not present

## 2015-12-18 DIAGNOSIS — N186 End stage renal disease: Secondary | ICD-10-CM | POA: Diagnosis not present

## 2015-12-18 DIAGNOSIS — D631 Anemia in chronic kidney disease: Secondary | ICD-10-CM | POA: Diagnosis not present

## 2015-12-18 DIAGNOSIS — N2581 Secondary hyperparathyroidism of renal origin: Secondary | ICD-10-CM | POA: Diagnosis not present

## 2015-12-19 DIAGNOSIS — K659 Peritonitis, unspecified: Secondary | ICD-10-CM | POA: Diagnosis not present

## 2015-12-19 DIAGNOSIS — D509 Iron deficiency anemia, unspecified: Secondary | ICD-10-CM | POA: Diagnosis not present

## 2015-12-19 DIAGNOSIS — N2581 Secondary hyperparathyroidism of renal origin: Secondary | ICD-10-CM | POA: Diagnosis not present

## 2015-12-19 DIAGNOSIS — D631 Anemia in chronic kidney disease: Secondary | ICD-10-CM | POA: Diagnosis not present

## 2015-12-19 DIAGNOSIS — N186 End stage renal disease: Secondary | ICD-10-CM | POA: Diagnosis not present

## 2015-12-20 DIAGNOSIS — N186 End stage renal disease: Secondary | ICD-10-CM | POA: Diagnosis not present

## 2015-12-20 DIAGNOSIS — D631 Anemia in chronic kidney disease: Secondary | ICD-10-CM | POA: Diagnosis not present

## 2015-12-20 DIAGNOSIS — K659 Peritonitis, unspecified: Secondary | ICD-10-CM | POA: Diagnosis not present

## 2015-12-20 DIAGNOSIS — N2581 Secondary hyperparathyroidism of renal origin: Secondary | ICD-10-CM | POA: Diagnosis not present

## 2015-12-20 DIAGNOSIS — D509 Iron deficiency anemia, unspecified: Secondary | ICD-10-CM | POA: Diagnosis not present

## 2015-12-21 DIAGNOSIS — N2581 Secondary hyperparathyroidism of renal origin: Secondary | ICD-10-CM | POA: Diagnosis not present

## 2015-12-21 DIAGNOSIS — Z79811 Long term (current) use of aromatase inhibitors: Secondary | ICD-10-CM | POA: Diagnosis not present

## 2015-12-21 DIAGNOSIS — D509 Iron deficiency anemia, unspecified: Secondary | ICD-10-CM | POA: Diagnosis not present

## 2015-12-21 DIAGNOSIS — C50919 Malignant neoplasm of unspecified site of unspecified female breast: Secondary | ICD-10-CM | POA: Diagnosis not present

## 2015-12-21 DIAGNOSIS — N186 End stage renal disease: Secondary | ICD-10-CM | POA: Diagnosis not present

## 2015-12-21 DIAGNOSIS — Z853 Personal history of malignant neoplasm of breast: Secondary | ICD-10-CM | POA: Diagnosis not present

## 2015-12-21 DIAGNOSIS — K659 Peritonitis, unspecified: Secondary | ICD-10-CM | POA: Diagnosis not present

## 2015-12-21 DIAGNOSIS — D631 Anemia in chronic kidney disease: Secondary | ICD-10-CM | POA: Diagnosis not present

## 2015-12-22 DIAGNOSIS — K659 Peritonitis, unspecified: Secondary | ICD-10-CM | POA: Diagnosis not present

## 2015-12-22 DIAGNOSIS — N2581 Secondary hyperparathyroidism of renal origin: Secondary | ICD-10-CM | POA: Diagnosis not present

## 2015-12-22 DIAGNOSIS — N186 End stage renal disease: Secondary | ICD-10-CM | POA: Diagnosis not present

## 2015-12-22 DIAGNOSIS — D631 Anemia in chronic kidney disease: Secondary | ICD-10-CM | POA: Diagnosis not present

## 2015-12-22 DIAGNOSIS — D509 Iron deficiency anemia, unspecified: Secondary | ICD-10-CM | POA: Diagnosis not present

## 2015-12-23 DIAGNOSIS — D631 Anemia in chronic kidney disease: Secondary | ICD-10-CM | POA: Diagnosis not present

## 2015-12-23 DIAGNOSIS — D509 Iron deficiency anemia, unspecified: Secondary | ICD-10-CM | POA: Diagnosis not present

## 2015-12-23 DIAGNOSIS — M159 Polyosteoarthritis, unspecified: Secondary | ICD-10-CM | POA: Diagnosis not present

## 2015-12-23 DIAGNOSIS — R600 Localized edema: Secondary | ICD-10-CM | POA: Diagnosis not present

## 2015-12-23 DIAGNOSIS — B0229 Other postherpetic nervous system involvement: Secondary | ICD-10-CM | POA: Diagnosis not present

## 2015-12-23 DIAGNOSIS — K659 Peritonitis, unspecified: Secondary | ICD-10-CM | POA: Diagnosis not present

## 2015-12-23 DIAGNOSIS — J449 Chronic obstructive pulmonary disease, unspecified: Secondary | ICD-10-CM | POA: Diagnosis not present

## 2015-12-23 DIAGNOSIS — N2581 Secondary hyperparathyroidism of renal origin: Secondary | ICD-10-CM | POA: Diagnosis not present

## 2015-12-23 DIAGNOSIS — E785 Hyperlipidemia, unspecified: Secondary | ICD-10-CM | POA: Diagnosis not present

## 2015-12-23 DIAGNOSIS — K219 Gastro-esophageal reflux disease without esophagitis: Secondary | ICD-10-CM | POA: Diagnosis not present

## 2015-12-23 DIAGNOSIS — J309 Allergic rhinitis, unspecified: Secondary | ICD-10-CM | POA: Diagnosis not present

## 2015-12-23 DIAGNOSIS — M545 Low back pain: Secondary | ICD-10-CM | POA: Diagnosis not present

## 2015-12-23 DIAGNOSIS — E039 Hypothyroidism, unspecified: Secondary | ICD-10-CM | POA: Diagnosis not present

## 2015-12-23 DIAGNOSIS — N186 End stage renal disease: Secondary | ICD-10-CM | POA: Diagnosis not present

## 2015-12-23 DIAGNOSIS — E1121 Type 2 diabetes mellitus with diabetic nephropathy: Secondary | ICD-10-CM | POA: Diagnosis not present

## 2015-12-23 DIAGNOSIS — I82409 Acute embolism and thrombosis of unspecified deep veins of unspecified lower extremity: Secondary | ICD-10-CM | POA: Diagnosis not present

## 2015-12-24 DIAGNOSIS — N186 End stage renal disease: Secondary | ICD-10-CM | POA: Diagnosis not present

## 2015-12-24 DIAGNOSIS — K659 Peritonitis, unspecified: Secondary | ICD-10-CM | POA: Diagnosis not present

## 2015-12-24 DIAGNOSIS — D631 Anemia in chronic kidney disease: Secondary | ICD-10-CM | POA: Diagnosis not present

## 2015-12-24 DIAGNOSIS — D509 Iron deficiency anemia, unspecified: Secondary | ICD-10-CM | POA: Diagnosis not present

## 2015-12-24 DIAGNOSIS — N2581 Secondary hyperparathyroidism of renal origin: Secondary | ICD-10-CM | POA: Diagnosis not present

## 2015-12-25 DIAGNOSIS — K659 Peritonitis, unspecified: Secondary | ICD-10-CM | POA: Diagnosis not present

## 2015-12-25 DIAGNOSIS — N186 End stage renal disease: Secondary | ICD-10-CM | POA: Diagnosis not present

## 2015-12-25 DIAGNOSIS — D509 Iron deficiency anemia, unspecified: Secondary | ICD-10-CM | POA: Diagnosis not present

## 2015-12-25 DIAGNOSIS — D631 Anemia in chronic kidney disease: Secondary | ICD-10-CM | POA: Diagnosis not present

## 2015-12-25 DIAGNOSIS — N2581 Secondary hyperparathyroidism of renal origin: Secondary | ICD-10-CM | POA: Diagnosis not present

## 2015-12-26 DIAGNOSIS — N186 End stage renal disease: Secondary | ICD-10-CM | POA: Diagnosis not present

## 2015-12-26 DIAGNOSIS — D509 Iron deficiency anemia, unspecified: Secondary | ICD-10-CM | POA: Diagnosis not present

## 2015-12-26 DIAGNOSIS — D631 Anemia in chronic kidney disease: Secondary | ICD-10-CM | POA: Diagnosis not present

## 2015-12-26 DIAGNOSIS — K659 Peritonitis, unspecified: Secondary | ICD-10-CM | POA: Diagnosis not present

## 2015-12-26 DIAGNOSIS — N2581 Secondary hyperparathyroidism of renal origin: Secondary | ICD-10-CM | POA: Diagnosis not present

## 2015-12-27 DIAGNOSIS — D509 Iron deficiency anemia, unspecified: Secondary | ICD-10-CM | POA: Diagnosis not present

## 2015-12-27 DIAGNOSIS — D631 Anemia in chronic kidney disease: Secondary | ICD-10-CM | POA: Diagnosis not present

## 2015-12-27 DIAGNOSIS — K659 Peritonitis, unspecified: Secondary | ICD-10-CM | POA: Diagnosis not present

## 2015-12-27 DIAGNOSIS — N186 End stage renal disease: Secondary | ICD-10-CM | POA: Diagnosis not present

## 2015-12-27 DIAGNOSIS — N2581 Secondary hyperparathyroidism of renal origin: Secondary | ICD-10-CM | POA: Diagnosis not present

## 2015-12-28 DIAGNOSIS — Z7982 Long term (current) use of aspirin: Secondary | ICD-10-CM | POA: Diagnosis not present

## 2015-12-28 DIAGNOSIS — Z452 Encounter for adjustment and management of vascular access device: Secondary | ICD-10-CM | POA: Diagnosis not present

## 2015-12-28 DIAGNOSIS — K659 Peritonitis, unspecified: Secondary | ICD-10-CM | POA: Diagnosis present

## 2015-12-28 DIAGNOSIS — J984 Other disorders of lung: Secondary | ICD-10-CM | POA: Diagnosis not present

## 2015-12-28 DIAGNOSIS — Z4901 Encounter for fitting and adjustment of extracorporeal dialysis catheter: Secondary | ICD-10-CM | POA: Diagnosis not present

## 2015-12-28 DIAGNOSIS — R188 Other ascites: Secondary | ICD-10-CM | POA: Diagnosis not present

## 2015-12-28 DIAGNOSIS — I10 Essential (primary) hypertension: Secondary | ICD-10-CM | POA: Diagnosis not present

## 2015-12-28 DIAGNOSIS — Z992 Dependence on renal dialysis: Secondary | ICD-10-CM | POA: Diagnosis not present

## 2015-12-28 DIAGNOSIS — Z8679 Personal history of other diseases of the circulatory system: Secondary | ICD-10-CM | POA: Diagnosis not present

## 2015-12-28 DIAGNOSIS — N2581 Secondary hyperparathyroidism of renal origin: Secondary | ICD-10-CM | POA: Diagnosis not present

## 2015-12-28 DIAGNOSIS — Z86718 Personal history of other venous thrombosis and embolism: Secondary | ICD-10-CM | POA: Diagnosis not present

## 2015-12-28 DIAGNOSIS — Z79811 Long term (current) use of aromatase inhibitors: Secondary | ICD-10-CM | POA: Diagnosis not present

## 2015-12-28 DIAGNOSIS — K7689 Other specified diseases of liver: Secondary | ICD-10-CM | POA: Diagnosis not present

## 2015-12-28 DIAGNOSIS — D509 Iron deficiency anemia, unspecified: Secondary | ICD-10-CM | POA: Diagnosis not present

## 2015-12-28 DIAGNOSIS — N185 Chronic kidney disease, stage 5: Secondary | ICD-10-CM | POA: Diagnosis not present

## 2015-12-28 DIAGNOSIS — T8571XA Infection and inflammatory reaction due to peritoneal dialysis catheter, initial encounter: Secondary | ICD-10-CM | POA: Diagnosis present

## 2015-12-28 DIAGNOSIS — Z882 Allergy status to sulfonamides status: Secondary | ICD-10-CM | POA: Diagnosis not present

## 2015-12-28 DIAGNOSIS — I120 Hypertensive chronic kidney disease with stage 5 chronic kidney disease or end stage renal disease: Secondary | ICD-10-CM | POA: Diagnosis not present

## 2015-12-28 DIAGNOSIS — K7469 Other cirrhosis of liver: Secondary | ICD-10-CM | POA: Diagnosis not present

## 2015-12-28 DIAGNOSIS — N186 End stage renal disease: Secondary | ICD-10-CM | POA: Diagnosis not present

## 2015-12-28 DIAGNOSIS — M1990 Unspecified osteoarthritis, unspecified site: Secondary | ICD-10-CM | POA: Diagnosis present

## 2015-12-28 DIAGNOSIS — B351 Tinea unguium: Secondary | ICD-10-CM | POA: Diagnosis not present

## 2015-12-28 DIAGNOSIS — R05 Cough: Secondary | ICD-10-CM | POA: Diagnosis not present

## 2015-12-28 DIAGNOSIS — K7460 Unspecified cirrhosis of liver: Secondary | ICD-10-CM | POA: Diagnosis not present

## 2015-12-28 DIAGNOSIS — D631 Anemia in chronic kidney disease: Secondary | ICD-10-CM | POA: Diagnosis not present

## 2016-01-07 DIAGNOSIS — Z992 Dependence on renal dialysis: Secondary | ICD-10-CM | POA: Diagnosis not present

## 2016-01-07 DIAGNOSIS — N186 End stage renal disease: Secondary | ICD-10-CM | POA: Diagnosis not present

## 2016-01-07 DIAGNOSIS — N033 Chronic nephritic syndrome with diffuse mesangial proliferative glomerulonephritis: Secondary | ICD-10-CM | POA: Diagnosis not present

## 2016-01-08 DIAGNOSIS — D631 Anemia in chronic kidney disease: Secondary | ICD-10-CM | POA: Diagnosis not present

## 2016-01-08 DIAGNOSIS — K650 Generalized (acute) peritonitis: Secondary | ICD-10-CM | POA: Diagnosis not present

## 2016-01-08 DIAGNOSIS — N186 End stage renal disease: Secondary | ICD-10-CM | POA: Diagnosis not present

## 2016-01-11 DIAGNOSIS — N186 End stage renal disease: Secondary | ICD-10-CM | POA: Diagnosis not present

## 2016-01-11 DIAGNOSIS — K650 Generalized (acute) peritonitis: Secondary | ICD-10-CM | POA: Diagnosis not present

## 2016-01-11 DIAGNOSIS — D631 Anemia in chronic kidney disease: Secondary | ICD-10-CM | POA: Diagnosis not present

## 2016-01-13 DIAGNOSIS — N186 End stage renal disease: Secondary | ICD-10-CM | POA: Diagnosis not present

## 2016-01-13 DIAGNOSIS — K650 Generalized (acute) peritonitis: Secondary | ICD-10-CM | POA: Diagnosis not present

## 2016-01-13 DIAGNOSIS — D631 Anemia in chronic kidney disease: Secondary | ICD-10-CM | POA: Diagnosis not present

## 2016-01-15 DIAGNOSIS — N186 End stage renal disease: Secondary | ICD-10-CM | POA: Diagnosis not present

## 2016-01-15 DIAGNOSIS — D631 Anemia in chronic kidney disease: Secondary | ICD-10-CM | POA: Diagnosis not present

## 2016-01-15 DIAGNOSIS — K650 Generalized (acute) peritonitis: Secondary | ICD-10-CM | POA: Diagnosis not present

## 2016-01-18 DIAGNOSIS — D631 Anemia in chronic kidney disease: Secondary | ICD-10-CM | POA: Diagnosis not present

## 2016-01-18 DIAGNOSIS — N186 End stage renal disease: Secondary | ICD-10-CM | POA: Diagnosis not present

## 2016-01-18 DIAGNOSIS — K650 Generalized (acute) peritonitis: Secondary | ICD-10-CM | POA: Diagnosis not present

## 2016-01-19 DIAGNOSIS — R928 Other abnormal and inconclusive findings on diagnostic imaging of breast: Secondary | ICD-10-CM | POA: Diagnosis not present

## 2016-01-20 DIAGNOSIS — D631 Anemia in chronic kidney disease: Secondary | ICD-10-CM | POA: Diagnosis not present

## 2016-01-20 DIAGNOSIS — N186 End stage renal disease: Secondary | ICD-10-CM | POA: Diagnosis not present

## 2016-01-20 DIAGNOSIS — K650 Generalized (acute) peritonitis: Secondary | ICD-10-CM | POA: Diagnosis not present

## 2016-01-22 DIAGNOSIS — D631 Anemia in chronic kidney disease: Secondary | ICD-10-CM | POA: Diagnosis not present

## 2016-01-22 DIAGNOSIS — N186 End stage renal disease: Secondary | ICD-10-CM | POA: Diagnosis not present

## 2016-01-22 DIAGNOSIS — K650 Generalized (acute) peritonitis: Secondary | ICD-10-CM | POA: Diagnosis not present

## 2016-01-24 ENCOUNTER — Encounter: Payer: Self-pay | Admitting: Podiatry

## 2016-01-24 ENCOUNTER — Ambulatory Visit (INDEPENDENT_AMBULATORY_CARE_PROVIDER_SITE_OTHER): Payer: Medicare Other | Admitting: Podiatry

## 2016-01-24 DIAGNOSIS — M79676 Pain in unspecified toe(s): Secondary | ICD-10-CM | POA: Diagnosis not present

## 2016-01-24 DIAGNOSIS — C50412 Malignant neoplasm of upper-outer quadrant of left female breast: Secondary | ICD-10-CM | POA: Diagnosis not present

## 2016-01-24 DIAGNOSIS — M79674 Pain in right toe(s): Secondary | ICD-10-CM | POA: Diagnosis not present

## 2016-01-24 DIAGNOSIS — B351 Tinea unguium: Secondary | ICD-10-CM | POA: Diagnosis not present

## 2016-01-24 DIAGNOSIS — E1140 Type 2 diabetes mellitus with diabetic neuropathy, unspecified: Secondary | ICD-10-CM

## 2016-01-24 NOTE — Progress Notes (Signed)
Patient ID: Linda Jordan, female   DOB: 03/29/1930, 82 y.o.   MRN: [id number] Complaint:  Visit Type: Patient returns to my office for continued preventative foot care services. Complaint: Patient states" my nails have grown long and thick and become painful to walk and wear shoes" Patient has been diagnosed with DM with no complications. He presents for preventative foot care services. No changes to ROS  Podiatric Exam: Vascular: dorsalis pedis and posterior tibial pulses are palpable bilateral. Capillary return is immediate. Temperature gradient is WNL. Skin turgor WNL  Sensorium: Normal Semmes Weinstein monofilament test. Normal tactile sensation bilaterally. Nail Exam: Pt has thick disfigured discolored nails with subungual debris noted bilateral entire nail hallux through fifth toenails Ulcer Exam: There is no evidence of ulcer or pre-ulcerative changes or infection. Orthopedic Exam: Muscle tone and strength are WNL. No limitations in general ROM. No crepitus or effusions noted. Foot type and digits show no abnormalities. Bony prominences are unremarkable. Toe contractures.  No pain. Skin: No Porokeratosis. No infection or ulcers  Diagnosis:  Tinea unguium, Pain in right toe, pain in left toes  Treatment & Plan Procedures and Treatment: Consent by patient was obtained for treatment procedures. The patient understood the discussion of treatment and procedures well. All questions were answered thoroughly reviewed. Debridement of mycotic and hypertrophic toenails, 1 through 5 bilateral and clearing of subungual debris. No ulceration, no infection noted.  Return Visit-Office Procedure: Patient instructed to return to the office for a follow up visit 3 months for continued evaluation and treatment.   Neida Ellegood DPM 

## 2016-01-25 DIAGNOSIS — K650 Generalized (acute) peritonitis: Secondary | ICD-10-CM | POA: Diagnosis not present

## 2016-01-25 DIAGNOSIS — D631 Anemia in chronic kidney disease: Secondary | ICD-10-CM | POA: Diagnosis not present

## 2016-01-25 DIAGNOSIS — N186 End stage renal disease: Secondary | ICD-10-CM | POA: Diagnosis not present

## 2016-01-27 DIAGNOSIS — D631 Anemia in chronic kidney disease: Secondary | ICD-10-CM | POA: Diagnosis not present

## 2016-01-27 DIAGNOSIS — N186 End stage renal disease: Secondary | ICD-10-CM | POA: Diagnosis not present

## 2016-01-27 DIAGNOSIS — K650 Generalized (acute) peritonitis: Secondary | ICD-10-CM | POA: Diagnosis not present

## 2016-01-29 DIAGNOSIS — D631 Anemia in chronic kidney disease: Secondary | ICD-10-CM | POA: Diagnosis not present

## 2016-01-29 DIAGNOSIS — K650 Generalized (acute) peritonitis: Secondary | ICD-10-CM | POA: Diagnosis not present

## 2016-01-29 DIAGNOSIS — N186 End stage renal disease: Secondary | ICD-10-CM | POA: Diagnosis not present

## 2016-02-01 DIAGNOSIS — K650 Generalized (acute) peritonitis: Secondary | ICD-10-CM | POA: Diagnosis not present

## 2016-02-01 DIAGNOSIS — D631 Anemia in chronic kidney disease: Secondary | ICD-10-CM | POA: Diagnosis not present

## 2016-02-01 DIAGNOSIS — N186 End stage renal disease: Secondary | ICD-10-CM | POA: Diagnosis not present

## 2016-02-03 DIAGNOSIS — N186 End stage renal disease: Secondary | ICD-10-CM | POA: Diagnosis not present

## 2016-02-03 DIAGNOSIS — K650 Generalized (acute) peritonitis: Secondary | ICD-10-CM | POA: Diagnosis not present

## 2016-02-03 DIAGNOSIS — D631 Anemia in chronic kidney disease: Secondary | ICD-10-CM | POA: Diagnosis not present

## 2016-02-05 DIAGNOSIS — N186 End stage renal disease: Secondary | ICD-10-CM | POA: Diagnosis not present

## 2016-02-05 DIAGNOSIS — K650 Generalized (acute) peritonitis: Secondary | ICD-10-CM | POA: Diagnosis not present

## 2016-02-05 DIAGNOSIS — D631 Anemia in chronic kidney disease: Secondary | ICD-10-CM | POA: Diagnosis not present

## 2016-02-07 DIAGNOSIS — N059 Unspecified nephritic syndrome with unspecified morphologic changes: Secondary | ICD-10-CM | POA: Diagnosis not present

## 2016-02-07 DIAGNOSIS — Z992 Dependence on renal dialysis: Secondary | ICD-10-CM | POA: Diagnosis not present

## 2016-02-07 DIAGNOSIS — N186 End stage renal disease: Secondary | ICD-10-CM | POA: Diagnosis not present

## 2016-02-08 DIAGNOSIS — N186 End stage renal disease: Secondary | ICD-10-CM | POA: Diagnosis not present

## 2016-02-08 DIAGNOSIS — Z992 Dependence on renal dialysis: Secondary | ICD-10-CM | POA: Diagnosis not present

## 2016-02-08 DIAGNOSIS — I10 Essential (primary) hypertension: Secondary | ICD-10-CM | POA: Diagnosis not present

## 2016-02-08 DIAGNOSIS — D631 Anemia in chronic kidney disease: Secondary | ICD-10-CM | POA: Diagnosis not present

## 2016-02-08 DIAGNOSIS — N2581 Secondary hyperparathyroidism of renal origin: Secondary | ICD-10-CM | POA: Diagnosis not present

## 2016-02-08 DIAGNOSIS — D509 Iron deficiency anemia, unspecified: Secondary | ICD-10-CM | POA: Diagnosis not present

## 2016-02-10 DIAGNOSIS — N186 End stage renal disease: Secondary | ICD-10-CM | POA: Diagnosis not present

## 2016-02-10 DIAGNOSIS — N2581 Secondary hyperparathyroidism of renal origin: Secondary | ICD-10-CM | POA: Diagnosis not present

## 2016-02-10 DIAGNOSIS — D631 Anemia in chronic kidney disease: Secondary | ICD-10-CM | POA: Diagnosis not present

## 2016-02-10 DIAGNOSIS — D509 Iron deficiency anemia, unspecified: Secondary | ICD-10-CM | POA: Diagnosis not present

## 2016-02-12 DIAGNOSIS — N186 End stage renal disease: Secondary | ICD-10-CM | POA: Diagnosis not present

## 2016-02-12 DIAGNOSIS — N2581 Secondary hyperparathyroidism of renal origin: Secondary | ICD-10-CM | POA: Diagnosis not present

## 2016-02-12 DIAGNOSIS — D509 Iron deficiency anemia, unspecified: Secondary | ICD-10-CM | POA: Diagnosis not present

## 2016-02-12 DIAGNOSIS — D631 Anemia in chronic kidney disease: Secondary | ICD-10-CM | POA: Diagnosis not present

## 2016-02-15 DIAGNOSIS — N186 End stage renal disease: Secondary | ICD-10-CM | POA: Diagnosis not present

## 2016-02-15 DIAGNOSIS — N2581 Secondary hyperparathyroidism of renal origin: Secondary | ICD-10-CM | POA: Diagnosis not present

## 2016-02-15 DIAGNOSIS — D509 Iron deficiency anemia, unspecified: Secondary | ICD-10-CM | POA: Diagnosis not present

## 2016-02-15 DIAGNOSIS — D631 Anemia in chronic kidney disease: Secondary | ICD-10-CM | POA: Diagnosis not present

## 2016-02-16 DIAGNOSIS — N186 End stage renal disease: Secondary | ICD-10-CM | POA: Diagnosis not present

## 2016-02-17 DIAGNOSIS — N2581 Secondary hyperparathyroidism of renal origin: Secondary | ICD-10-CM | POA: Diagnosis not present

## 2016-02-17 DIAGNOSIS — N186 End stage renal disease: Secondary | ICD-10-CM | POA: Diagnosis not present

## 2016-02-17 DIAGNOSIS — D631 Anemia in chronic kidney disease: Secondary | ICD-10-CM | POA: Diagnosis not present

## 2016-02-17 DIAGNOSIS — D509 Iron deficiency anemia, unspecified: Secondary | ICD-10-CM | POA: Diagnosis not present

## 2016-02-19 DIAGNOSIS — N2581 Secondary hyperparathyroidism of renal origin: Secondary | ICD-10-CM | POA: Diagnosis not present

## 2016-02-19 DIAGNOSIS — N186 End stage renal disease: Secondary | ICD-10-CM | POA: Diagnosis not present

## 2016-02-19 DIAGNOSIS — D631 Anemia in chronic kidney disease: Secondary | ICD-10-CM | POA: Diagnosis not present

## 2016-02-19 DIAGNOSIS — D509 Iron deficiency anemia, unspecified: Secondary | ICD-10-CM | POA: Diagnosis not present

## 2016-02-22 DIAGNOSIS — N186 End stage renal disease: Secondary | ICD-10-CM | POA: Diagnosis not present

## 2016-02-22 DIAGNOSIS — D509 Iron deficiency anemia, unspecified: Secondary | ICD-10-CM | POA: Diagnosis not present

## 2016-02-22 DIAGNOSIS — D631 Anemia in chronic kidney disease: Secondary | ICD-10-CM | POA: Diagnosis not present

## 2016-02-22 DIAGNOSIS — N2581 Secondary hyperparathyroidism of renal origin: Secondary | ICD-10-CM | POA: Diagnosis not present

## 2016-02-23 DIAGNOSIS — K660 Peritoneal adhesions (postprocedural) (postinfection): Secondary | ICD-10-CM | POA: Diagnosis not present

## 2016-02-23 DIAGNOSIS — N186 End stage renal disease: Secondary | ICD-10-CM | POA: Diagnosis not present

## 2016-02-24 DIAGNOSIS — D631 Anemia in chronic kidney disease: Secondary | ICD-10-CM | POA: Diagnosis not present

## 2016-02-24 DIAGNOSIS — N2581 Secondary hyperparathyroidism of renal origin: Secondary | ICD-10-CM | POA: Diagnosis not present

## 2016-02-24 DIAGNOSIS — N186 End stage renal disease: Secondary | ICD-10-CM | POA: Diagnosis not present

## 2016-02-24 DIAGNOSIS — D509 Iron deficiency anemia, unspecified: Secondary | ICD-10-CM | POA: Diagnosis not present

## 2016-02-26 DIAGNOSIS — N2581 Secondary hyperparathyroidism of renal origin: Secondary | ICD-10-CM | POA: Diagnosis not present

## 2016-02-26 DIAGNOSIS — N186 End stage renal disease: Secondary | ICD-10-CM | POA: Diagnosis not present

## 2016-02-26 DIAGNOSIS — D631 Anemia in chronic kidney disease: Secondary | ICD-10-CM | POA: Diagnosis not present

## 2016-02-26 DIAGNOSIS — D509 Iron deficiency anemia, unspecified: Secondary | ICD-10-CM | POA: Diagnosis not present

## 2016-02-29 DIAGNOSIS — N2581 Secondary hyperparathyroidism of renal origin: Secondary | ICD-10-CM | POA: Diagnosis not present

## 2016-02-29 DIAGNOSIS — D631 Anemia in chronic kidney disease: Secondary | ICD-10-CM | POA: Diagnosis not present

## 2016-02-29 DIAGNOSIS — N186 End stage renal disease: Secondary | ICD-10-CM | POA: Diagnosis not present

## 2016-02-29 DIAGNOSIS — D509 Iron deficiency anemia, unspecified: Secondary | ICD-10-CM | POA: Diagnosis not present

## 2016-03-02 DIAGNOSIS — D509 Iron deficiency anemia, unspecified: Secondary | ICD-10-CM | POA: Diagnosis not present

## 2016-03-02 DIAGNOSIS — N186 End stage renal disease: Secondary | ICD-10-CM | POA: Diagnosis not present

## 2016-03-02 DIAGNOSIS — D631 Anemia in chronic kidney disease: Secondary | ICD-10-CM | POA: Diagnosis not present

## 2016-03-02 DIAGNOSIS — N2581 Secondary hyperparathyroidism of renal origin: Secondary | ICD-10-CM | POA: Diagnosis not present

## 2016-03-04 DIAGNOSIS — N2581 Secondary hyperparathyroidism of renal origin: Secondary | ICD-10-CM | POA: Diagnosis not present

## 2016-03-04 DIAGNOSIS — N186 End stage renal disease: Secondary | ICD-10-CM | POA: Diagnosis not present

## 2016-03-04 DIAGNOSIS — D509 Iron deficiency anemia, unspecified: Secondary | ICD-10-CM | POA: Diagnosis not present

## 2016-03-04 DIAGNOSIS — D631 Anemia in chronic kidney disease: Secondary | ICD-10-CM | POA: Diagnosis not present

## 2016-03-06 DIAGNOSIS — N186 End stage renal disease: Secondary | ICD-10-CM | POA: Diagnosis not present

## 2016-03-06 DIAGNOSIS — Z79899 Other long term (current) drug therapy: Secondary | ICD-10-CM | POA: Diagnosis not present

## 2016-03-06 DIAGNOSIS — Z992 Dependence on renal dialysis: Secondary | ICD-10-CM | POA: Diagnosis not present

## 2016-03-06 DIAGNOSIS — I120 Hypertensive chronic kidney disease with stage 5 chronic kidney disease or end stage renal disease: Secondary | ICD-10-CM | POA: Diagnosis not present

## 2016-03-07 DIAGNOSIS — D509 Iron deficiency anemia, unspecified: Secondary | ICD-10-CM | POA: Diagnosis not present

## 2016-03-07 DIAGNOSIS — D631 Anemia in chronic kidney disease: Secondary | ICD-10-CM | POA: Diagnosis not present

## 2016-03-07 DIAGNOSIS — N2581 Secondary hyperparathyroidism of renal origin: Secondary | ICD-10-CM | POA: Diagnosis not present

## 2016-03-07 DIAGNOSIS — N186 End stage renal disease: Secondary | ICD-10-CM | POA: Diagnosis not present

## 2016-03-09 DIAGNOSIS — N2581 Secondary hyperparathyroidism of renal origin: Secondary | ICD-10-CM | POA: Diagnosis not present

## 2016-03-09 DIAGNOSIS — Z992 Dependence on renal dialysis: Secondary | ICD-10-CM | POA: Diagnosis not present

## 2016-03-09 DIAGNOSIS — D631 Anemia in chronic kidney disease: Secondary | ICD-10-CM | POA: Diagnosis not present

## 2016-03-09 DIAGNOSIS — D509 Iron deficiency anemia, unspecified: Secondary | ICD-10-CM | POA: Diagnosis not present

## 2016-03-09 DIAGNOSIS — N059 Unspecified nephritic syndrome with unspecified morphologic changes: Secondary | ICD-10-CM | POA: Diagnosis not present

## 2016-03-09 DIAGNOSIS — N186 End stage renal disease: Secondary | ICD-10-CM | POA: Diagnosis not present

## 2016-03-11 DIAGNOSIS — D509 Iron deficiency anemia, unspecified: Secondary | ICD-10-CM | POA: Diagnosis not present

## 2016-03-11 DIAGNOSIS — N186 End stage renal disease: Secondary | ICD-10-CM | POA: Diagnosis not present

## 2016-03-14 DIAGNOSIS — N186 End stage renal disease: Secondary | ICD-10-CM | POA: Diagnosis not present

## 2016-03-14 DIAGNOSIS — D509 Iron deficiency anemia, unspecified: Secondary | ICD-10-CM | POA: Diagnosis not present

## 2016-03-16 DIAGNOSIS — D509 Iron deficiency anemia, unspecified: Secondary | ICD-10-CM | POA: Diagnosis not present

## 2016-03-16 DIAGNOSIS — N186 End stage renal disease: Secondary | ICD-10-CM | POA: Diagnosis not present

## 2016-03-18 DIAGNOSIS — D509 Iron deficiency anemia, unspecified: Secondary | ICD-10-CM | POA: Diagnosis not present

## 2016-03-18 DIAGNOSIS — N186 End stage renal disease: Secondary | ICD-10-CM | POA: Diagnosis not present

## 2016-03-21 DIAGNOSIS — D509 Iron deficiency anemia, unspecified: Secondary | ICD-10-CM | POA: Diagnosis not present

## 2016-03-21 DIAGNOSIS — N186 End stage renal disease: Secondary | ICD-10-CM | POA: Diagnosis not present

## 2016-03-23 DIAGNOSIS — N186 End stage renal disease: Secondary | ICD-10-CM | POA: Diagnosis not present

## 2016-03-23 DIAGNOSIS — D509 Iron deficiency anemia, unspecified: Secondary | ICD-10-CM | POA: Diagnosis not present

## 2016-03-25 DIAGNOSIS — N186 End stage renal disease: Secondary | ICD-10-CM | POA: Diagnosis not present

## 2016-03-25 DIAGNOSIS — D509 Iron deficiency anemia, unspecified: Secondary | ICD-10-CM | POA: Diagnosis not present

## 2016-03-28 DIAGNOSIS — N186 End stage renal disease: Secondary | ICD-10-CM | POA: Diagnosis not present

## 2016-03-28 DIAGNOSIS — D509 Iron deficiency anemia, unspecified: Secondary | ICD-10-CM | POA: Diagnosis not present

## 2016-03-30 DIAGNOSIS — N186 End stage renal disease: Secondary | ICD-10-CM | POA: Diagnosis not present

## 2016-03-30 DIAGNOSIS — D509 Iron deficiency anemia, unspecified: Secondary | ICD-10-CM | POA: Diagnosis not present

## 2016-04-01 DIAGNOSIS — D509 Iron deficiency anemia, unspecified: Secondary | ICD-10-CM | POA: Diagnosis not present

## 2016-04-01 DIAGNOSIS — N186 End stage renal disease: Secondary | ICD-10-CM | POA: Diagnosis not present

## 2016-04-04 DIAGNOSIS — N186 End stage renal disease: Secondary | ICD-10-CM | POA: Diagnosis not present

## 2016-04-04 DIAGNOSIS — D509 Iron deficiency anemia, unspecified: Secondary | ICD-10-CM | POA: Diagnosis not present

## 2016-04-06 DIAGNOSIS — D509 Iron deficiency anemia, unspecified: Secondary | ICD-10-CM | POA: Diagnosis not present

## 2016-04-06 DIAGNOSIS — N186 End stage renal disease: Secondary | ICD-10-CM | POA: Diagnosis not present

## 2016-04-08 DIAGNOSIS — N186 End stage renal disease: Secondary | ICD-10-CM | POA: Diagnosis not present

## 2016-04-08 DIAGNOSIS — N059 Unspecified nephritic syndrome with unspecified morphologic changes: Secondary | ICD-10-CM | POA: Diagnosis not present

## 2016-04-08 DIAGNOSIS — D509 Iron deficiency anemia, unspecified: Secondary | ICD-10-CM | POA: Diagnosis not present

## 2016-04-08 DIAGNOSIS — Z992 Dependence on renal dialysis: Secondary | ICD-10-CM | POA: Diagnosis not present

## 2016-04-11 DIAGNOSIS — D631 Anemia in chronic kidney disease: Secondary | ICD-10-CM | POA: Diagnosis not present

## 2016-04-11 DIAGNOSIS — Z23 Encounter for immunization: Secondary | ICD-10-CM | POA: Diagnosis not present

## 2016-04-11 DIAGNOSIS — N186 End stage renal disease: Secondary | ICD-10-CM | POA: Diagnosis not present

## 2016-04-13 DIAGNOSIS — Z23 Encounter for immunization: Secondary | ICD-10-CM | POA: Diagnosis not present

## 2016-04-13 DIAGNOSIS — D631 Anemia in chronic kidney disease: Secondary | ICD-10-CM | POA: Diagnosis not present

## 2016-04-13 DIAGNOSIS — N186 End stage renal disease: Secondary | ICD-10-CM | POA: Diagnosis not present

## 2016-04-15 DIAGNOSIS — N186 End stage renal disease: Secondary | ICD-10-CM | POA: Diagnosis not present

## 2016-04-15 DIAGNOSIS — Z23 Encounter for immunization: Secondary | ICD-10-CM | POA: Diagnosis not present

## 2016-04-15 DIAGNOSIS — D631 Anemia in chronic kidney disease: Secondary | ICD-10-CM | POA: Diagnosis not present

## 2016-04-18 DIAGNOSIS — Z23 Encounter for immunization: Secondary | ICD-10-CM | POA: Diagnosis not present

## 2016-04-18 DIAGNOSIS — D631 Anemia in chronic kidney disease: Secondary | ICD-10-CM | POA: Diagnosis not present

## 2016-04-18 DIAGNOSIS — N186 End stage renal disease: Secondary | ICD-10-CM | POA: Diagnosis not present

## 2016-04-20 DIAGNOSIS — Z23 Encounter for immunization: Secondary | ICD-10-CM | POA: Diagnosis not present

## 2016-04-20 DIAGNOSIS — N186 End stage renal disease: Secondary | ICD-10-CM | POA: Diagnosis not present

## 2016-04-20 DIAGNOSIS — D631 Anemia in chronic kidney disease: Secondary | ICD-10-CM | POA: Diagnosis not present

## 2016-04-21 DIAGNOSIS — Z79811 Long term (current) use of aromatase inhibitors: Secondary | ICD-10-CM

## 2016-04-21 DIAGNOSIS — Z170 Estrogen receptor positive status [ER+]: Secondary | ICD-10-CM

## 2016-04-21 DIAGNOSIS — Z853 Personal history of malignant neoplasm of breast: Secondary | ICD-10-CM | POA: Diagnosis not present

## 2016-04-21 DIAGNOSIS — C50412 Malignant neoplasm of upper-outer quadrant of left female breast: Secondary | ICD-10-CM

## 2016-04-22 DIAGNOSIS — N186 End stage renal disease: Secondary | ICD-10-CM | POA: Diagnosis not present

## 2016-04-22 DIAGNOSIS — D631 Anemia in chronic kidney disease: Secondary | ICD-10-CM | POA: Diagnosis not present

## 2016-04-22 DIAGNOSIS — Z23 Encounter for immunization: Secondary | ICD-10-CM | POA: Diagnosis not present

## 2016-04-24 ENCOUNTER — Ambulatory Visit (INDEPENDENT_AMBULATORY_CARE_PROVIDER_SITE_OTHER): Payer: Medicare Other | Admitting: Podiatry

## 2016-04-24 ENCOUNTER — Encounter: Payer: Self-pay | Admitting: Podiatry

## 2016-04-24 VITALS — BP 136/63 | HR 69 | Resp 14

## 2016-04-24 DIAGNOSIS — M79674 Pain in right toe(s): Secondary | ICD-10-CM

## 2016-04-24 DIAGNOSIS — B351 Tinea unguium: Secondary | ICD-10-CM | POA: Diagnosis not present

## 2016-04-24 DIAGNOSIS — E1140 Type 2 diabetes mellitus with diabetic neuropathy, unspecified: Secondary | ICD-10-CM

## 2016-04-24 NOTE — Progress Notes (Signed)
Patient ID: Linda Jordan, female   DOB: 12-13-1929, 82 y.o.   MRN: [id number] Complaint:  Visit Type: Patient returns to my office for continued preventative foot care services. Complaint: Patient states" my nails have grown long and thick and become painful to walk and wear shoes" Patient has been diagnosed with DM with no complications. He presents for preventative foot care services. No changes to ROS  Podiatric Exam: Vascular: dorsalis pedis and posterior tibial pulses are palpable bilateral. Capillary return is immediate. Temperature gradient is WNL. Skin turgor WNL  Sensorium: Normal Semmes Weinstein monofilament test. Normal tactile sensation bilaterally. Nail Exam: Pt has thick disfigured discolored nails with subungual debris noted bilateral entire nail hallux through fifth toenails Ulcer Exam: There is no evidence of ulcer or pre-ulcerative changes or infection. Orthopedic Exam: Muscle tone and strength are WNL. No limitations in general ROM. No crepitus or effusions noted. Foot type and digits show no abnormalities. Bony prominences are unremarkable. Toe contractures.  No pain. Skin: No Porokeratosis. No infection or ulcers  Diagnosis:  Tinea unguium, Pain in right toe, pain in left toes  Treatment & Plan Procedures and Treatment: Consent by patient was obtained for treatment procedures. The patient understood the discussion of treatment and procedures well. All questions were answered thoroughly reviewed. Debridement of mycotic and hypertrophic toenails, 1 through 5 bilateral and clearing of subungual debris. No ulceration, no infection noted.  Return Visit-Office Procedure: Patient instructed to return to the office for a follow up visit 3 months for continued evaluation and treatment.   Gardiner Barefoot DPM

## 2016-04-25 DIAGNOSIS — Z23 Encounter for immunization: Secondary | ICD-10-CM | POA: Diagnosis not present

## 2016-04-25 DIAGNOSIS — N186 End stage renal disease: Secondary | ICD-10-CM | POA: Diagnosis not present

## 2016-04-25 DIAGNOSIS — D631 Anemia in chronic kidney disease: Secondary | ICD-10-CM | POA: Diagnosis not present

## 2016-04-27 DIAGNOSIS — Z23 Encounter for immunization: Secondary | ICD-10-CM | POA: Diagnosis not present

## 2016-04-27 DIAGNOSIS — D631 Anemia in chronic kidney disease: Secondary | ICD-10-CM | POA: Diagnosis not present

## 2016-04-27 DIAGNOSIS — N186 End stage renal disease: Secondary | ICD-10-CM | POA: Diagnosis not present

## 2016-04-28 DIAGNOSIS — M159 Polyosteoarthritis, unspecified: Secondary | ICD-10-CM | POA: Diagnosis not present

## 2016-04-28 DIAGNOSIS — J449 Chronic obstructive pulmonary disease, unspecified: Secondary | ICD-10-CM | POA: Diagnosis not present

## 2016-04-28 DIAGNOSIS — N186 End stage renal disease: Secondary | ICD-10-CM | POA: Diagnosis not present

## 2016-04-28 DIAGNOSIS — E785 Hyperlipidemia, unspecified: Secondary | ICD-10-CM | POA: Diagnosis not present

## 2016-04-28 DIAGNOSIS — I82409 Acute embolism and thrombosis of unspecified deep veins of unspecified lower extremity: Secondary | ICD-10-CM | POA: Diagnosis not present

## 2016-04-28 DIAGNOSIS — B0229 Other postherpetic nervous system involvement: Secondary | ICD-10-CM | POA: Diagnosis not present

## 2016-04-28 DIAGNOSIS — J309 Allergic rhinitis, unspecified: Secondary | ICD-10-CM | POA: Diagnosis not present

## 2016-04-28 DIAGNOSIS — E039 Hypothyroidism, unspecified: Secondary | ICD-10-CM | POA: Diagnosis not present

## 2016-04-28 DIAGNOSIS — M545 Low back pain: Secondary | ICD-10-CM | POA: Diagnosis not present

## 2016-04-28 DIAGNOSIS — R600 Localized edema: Secondary | ICD-10-CM | POA: Diagnosis not present

## 2016-04-28 DIAGNOSIS — E1121 Type 2 diabetes mellitus with diabetic nephropathy: Secondary | ICD-10-CM | POA: Diagnosis not present

## 2016-04-28 DIAGNOSIS — K219 Gastro-esophageal reflux disease without esophagitis: Secondary | ICD-10-CM | POA: Diagnosis not present

## 2016-04-29 DIAGNOSIS — D631 Anemia in chronic kidney disease: Secondary | ICD-10-CM | POA: Diagnosis not present

## 2016-04-29 DIAGNOSIS — Z23 Encounter for immunization: Secondary | ICD-10-CM | POA: Diagnosis not present

## 2016-04-29 DIAGNOSIS — E1121 Type 2 diabetes mellitus with diabetic nephropathy: Secondary | ICD-10-CM | POA: Diagnosis not present

## 2016-04-29 DIAGNOSIS — N186 End stage renal disease: Secondary | ICD-10-CM | POA: Diagnosis not present

## 2016-05-02 DIAGNOSIS — D631 Anemia in chronic kidney disease: Secondary | ICD-10-CM | POA: Diagnosis not present

## 2016-05-02 DIAGNOSIS — N186 End stage renal disease: Secondary | ICD-10-CM | POA: Diagnosis not present

## 2016-05-02 DIAGNOSIS — Z23 Encounter for immunization: Secondary | ICD-10-CM | POA: Diagnosis not present

## 2016-05-04 DIAGNOSIS — N186 End stage renal disease: Secondary | ICD-10-CM | POA: Diagnosis not present

## 2016-05-04 DIAGNOSIS — Z23 Encounter for immunization: Secondary | ICD-10-CM | POA: Diagnosis not present

## 2016-05-04 DIAGNOSIS — D631 Anemia in chronic kidney disease: Secondary | ICD-10-CM | POA: Diagnosis not present

## 2016-05-06 DIAGNOSIS — Z23 Encounter for immunization: Secondary | ICD-10-CM | POA: Diagnosis not present

## 2016-05-06 DIAGNOSIS — N186 End stage renal disease: Secondary | ICD-10-CM | POA: Diagnosis not present

## 2016-05-06 DIAGNOSIS — D631 Anemia in chronic kidney disease: Secondary | ICD-10-CM | POA: Diagnosis not present

## 2016-05-09 DIAGNOSIS — N059 Unspecified nephritic syndrome with unspecified morphologic changes: Secondary | ICD-10-CM | POA: Diagnosis not present

## 2016-05-09 DIAGNOSIS — Z992 Dependence on renal dialysis: Secondary | ICD-10-CM | POA: Diagnosis not present

## 2016-05-09 DIAGNOSIS — D631 Anemia in chronic kidney disease: Secondary | ICD-10-CM | POA: Diagnosis not present

## 2016-05-09 DIAGNOSIS — N186 End stage renal disease: Secondary | ICD-10-CM | POA: Diagnosis not present

## 2016-05-09 DIAGNOSIS — Z23 Encounter for immunization: Secondary | ICD-10-CM | POA: Diagnosis not present

## 2016-05-11 DIAGNOSIS — N2581 Secondary hyperparathyroidism of renal origin: Secondary | ICD-10-CM | POA: Diagnosis not present

## 2016-05-11 DIAGNOSIS — D631 Anemia in chronic kidney disease: Secondary | ICD-10-CM | POA: Diagnosis not present

## 2016-05-11 DIAGNOSIS — N186 End stage renal disease: Secondary | ICD-10-CM | POA: Diagnosis not present

## 2016-05-13 DIAGNOSIS — D631 Anemia in chronic kidney disease: Secondary | ICD-10-CM | POA: Diagnosis not present

## 2016-05-13 DIAGNOSIS — N2581 Secondary hyperparathyroidism of renal origin: Secondary | ICD-10-CM | POA: Diagnosis not present

## 2016-05-13 DIAGNOSIS — N186 End stage renal disease: Secondary | ICD-10-CM | POA: Diagnosis not present

## 2016-05-16 DIAGNOSIS — N186 End stage renal disease: Secondary | ICD-10-CM | POA: Diagnosis not present

## 2016-05-16 DIAGNOSIS — D631 Anemia in chronic kidney disease: Secondary | ICD-10-CM | POA: Diagnosis not present

## 2016-05-16 DIAGNOSIS — N2581 Secondary hyperparathyroidism of renal origin: Secondary | ICD-10-CM | POA: Diagnosis not present

## 2016-05-18 DIAGNOSIS — D631 Anemia in chronic kidney disease: Secondary | ICD-10-CM | POA: Diagnosis not present

## 2016-05-18 DIAGNOSIS — N2581 Secondary hyperparathyroidism of renal origin: Secondary | ICD-10-CM | POA: Diagnosis not present

## 2016-05-18 DIAGNOSIS — N186 End stage renal disease: Secondary | ICD-10-CM | POA: Diagnosis not present

## 2016-05-20 DIAGNOSIS — N186 End stage renal disease: Secondary | ICD-10-CM | POA: Diagnosis not present

## 2016-05-20 DIAGNOSIS — N2581 Secondary hyperparathyroidism of renal origin: Secondary | ICD-10-CM | POA: Diagnosis not present

## 2016-05-20 DIAGNOSIS — D631 Anemia in chronic kidney disease: Secondary | ICD-10-CM | POA: Diagnosis not present

## 2016-05-22 DIAGNOSIS — N059 Unspecified nephritic syndrome with unspecified morphologic changes: Secondary | ICD-10-CM | POA: Diagnosis not present

## 2016-05-22 DIAGNOSIS — Z4901 Encounter for fitting and adjustment of extracorporeal dialysis catheter: Secondary | ICD-10-CM | POA: Diagnosis not present

## 2016-05-23 DIAGNOSIS — D631 Anemia in chronic kidney disease: Secondary | ICD-10-CM | POA: Diagnosis not present

## 2016-05-23 DIAGNOSIS — N186 End stage renal disease: Secondary | ICD-10-CM | POA: Diagnosis not present

## 2016-05-23 DIAGNOSIS — N2581 Secondary hyperparathyroidism of renal origin: Secondary | ICD-10-CM | POA: Diagnosis not present

## 2016-05-25 DIAGNOSIS — N2581 Secondary hyperparathyroidism of renal origin: Secondary | ICD-10-CM | POA: Diagnosis not present

## 2016-05-25 DIAGNOSIS — D631 Anemia in chronic kidney disease: Secondary | ICD-10-CM | POA: Diagnosis not present

## 2016-05-25 DIAGNOSIS — N186 End stage renal disease: Secondary | ICD-10-CM | POA: Diagnosis not present

## 2016-05-26 DIAGNOSIS — M159 Polyosteoarthritis, unspecified: Secondary | ICD-10-CM | POA: Diagnosis not present

## 2016-05-26 DIAGNOSIS — J449 Chronic obstructive pulmonary disease, unspecified: Secondary | ICD-10-CM | POA: Diagnosis not present

## 2016-05-26 DIAGNOSIS — E039 Hypothyroidism, unspecified: Secondary | ICD-10-CM | POA: Diagnosis not present

## 2016-05-26 DIAGNOSIS — R600 Localized edema: Secondary | ICD-10-CM | POA: Diagnosis not present

## 2016-05-26 DIAGNOSIS — J309 Allergic rhinitis, unspecified: Secondary | ICD-10-CM | POA: Diagnosis not present

## 2016-05-26 DIAGNOSIS — N186 End stage renal disease: Secondary | ICD-10-CM | POA: Diagnosis not present

## 2016-05-26 DIAGNOSIS — K219 Gastro-esophageal reflux disease without esophagitis: Secondary | ICD-10-CM | POA: Diagnosis not present

## 2016-05-26 DIAGNOSIS — M545 Low back pain: Secondary | ICD-10-CM | POA: Diagnosis not present

## 2016-05-26 DIAGNOSIS — E785 Hyperlipidemia, unspecified: Secondary | ICD-10-CM | POA: Diagnosis not present

## 2016-05-26 DIAGNOSIS — E1121 Type 2 diabetes mellitus with diabetic nephropathy: Secondary | ICD-10-CM | POA: Diagnosis not present

## 2016-05-26 DIAGNOSIS — I82409 Acute embolism and thrombosis of unspecified deep veins of unspecified lower extremity: Secondary | ICD-10-CM | POA: Diagnosis not present

## 2016-05-26 DIAGNOSIS — B0229 Other postherpetic nervous system involvement: Secondary | ICD-10-CM | POA: Diagnosis not present

## 2016-05-27 DIAGNOSIS — N186 End stage renal disease: Secondary | ICD-10-CM | POA: Diagnosis not present

## 2016-05-27 DIAGNOSIS — D631 Anemia in chronic kidney disease: Secondary | ICD-10-CM | POA: Diagnosis not present

## 2016-05-27 DIAGNOSIS — N2581 Secondary hyperparathyroidism of renal origin: Secondary | ICD-10-CM | POA: Diagnosis not present

## 2016-05-29 DIAGNOSIS — K7689 Other specified diseases of liver: Secondary | ICD-10-CM | POA: Diagnosis not present

## 2016-05-29 DIAGNOSIS — R8299 Other abnormal findings in urine: Secondary | ICD-10-CM | POA: Diagnosis not present

## 2016-05-29 DIAGNOSIS — R17 Unspecified jaundice: Secondary | ICD-10-CM | POA: Diagnosis not present

## 2016-05-29 DIAGNOSIS — D631 Anemia in chronic kidney disease: Secondary | ICD-10-CM | POA: Diagnosis not present

## 2016-05-29 DIAGNOSIS — D509 Iron deficiency anemia, unspecified: Secondary | ICD-10-CM | POA: Diagnosis not present

## 2016-05-29 DIAGNOSIS — N186 End stage renal disease: Secondary | ICD-10-CM | POA: Diagnosis not present

## 2016-05-30 DIAGNOSIS — N186 End stage renal disease: Secondary | ICD-10-CM | POA: Diagnosis not present

## 2016-05-30 DIAGNOSIS — D509 Iron deficiency anemia, unspecified: Secondary | ICD-10-CM | POA: Diagnosis not present

## 2016-05-30 DIAGNOSIS — K7689 Other specified diseases of liver: Secondary | ICD-10-CM | POA: Diagnosis not present

## 2016-05-30 DIAGNOSIS — R17 Unspecified jaundice: Secondary | ICD-10-CM | POA: Diagnosis not present

## 2016-05-30 DIAGNOSIS — D631 Anemia in chronic kidney disease: Secondary | ICD-10-CM | POA: Diagnosis not present

## 2016-05-31 DIAGNOSIS — D509 Iron deficiency anemia, unspecified: Secondary | ICD-10-CM | POA: Diagnosis not present

## 2016-05-31 DIAGNOSIS — K7689 Other specified diseases of liver: Secondary | ICD-10-CM | POA: Diagnosis not present

## 2016-05-31 DIAGNOSIS — D631 Anemia in chronic kidney disease: Secondary | ICD-10-CM | POA: Diagnosis not present

## 2016-05-31 DIAGNOSIS — R17 Unspecified jaundice: Secondary | ICD-10-CM | POA: Diagnosis not present

## 2016-05-31 DIAGNOSIS — N186 End stage renal disease: Secondary | ICD-10-CM | POA: Diagnosis not present

## 2016-06-02 DIAGNOSIS — K7689 Other specified diseases of liver: Secondary | ICD-10-CM | POA: Diagnosis not present

## 2016-06-02 DIAGNOSIS — D509 Iron deficiency anemia, unspecified: Secondary | ICD-10-CM | POA: Diagnosis not present

## 2016-06-02 DIAGNOSIS — R17 Unspecified jaundice: Secondary | ICD-10-CM | POA: Diagnosis not present

## 2016-06-02 DIAGNOSIS — N186 End stage renal disease: Secondary | ICD-10-CM | POA: Diagnosis not present

## 2016-06-02 DIAGNOSIS — D631 Anemia in chronic kidney disease: Secondary | ICD-10-CM | POA: Diagnosis not present

## 2016-06-05 DIAGNOSIS — D631 Anemia in chronic kidney disease: Secondary | ICD-10-CM | POA: Diagnosis not present

## 2016-06-05 DIAGNOSIS — R17 Unspecified jaundice: Secondary | ICD-10-CM | POA: Diagnosis not present

## 2016-06-05 DIAGNOSIS — N186 End stage renal disease: Secondary | ICD-10-CM | POA: Diagnosis not present

## 2016-06-05 DIAGNOSIS — D509 Iron deficiency anemia, unspecified: Secondary | ICD-10-CM | POA: Diagnosis not present

## 2016-06-05 DIAGNOSIS — K7689 Other specified diseases of liver: Secondary | ICD-10-CM | POA: Diagnosis not present

## 2016-06-06 DIAGNOSIS — D509 Iron deficiency anemia, unspecified: Secondary | ICD-10-CM | POA: Diagnosis not present

## 2016-06-06 DIAGNOSIS — D631 Anemia in chronic kidney disease: Secondary | ICD-10-CM | POA: Diagnosis not present

## 2016-06-06 DIAGNOSIS — K7689 Other specified diseases of liver: Secondary | ICD-10-CM | POA: Diagnosis not present

## 2016-06-06 DIAGNOSIS — N186 End stage renal disease: Secondary | ICD-10-CM | POA: Diagnosis not present

## 2016-06-06 DIAGNOSIS — R17 Unspecified jaundice: Secondary | ICD-10-CM | POA: Diagnosis not present

## 2016-06-08 DIAGNOSIS — K7689 Other specified diseases of liver: Secondary | ICD-10-CM | POA: Diagnosis not present

## 2016-06-08 DIAGNOSIS — Z992 Dependence on renal dialysis: Secondary | ICD-10-CM | POA: Diagnosis not present

## 2016-06-08 DIAGNOSIS — N059 Unspecified nephritic syndrome with unspecified morphologic changes: Secondary | ICD-10-CM | POA: Diagnosis not present

## 2016-06-08 DIAGNOSIS — N186 End stage renal disease: Secondary | ICD-10-CM | POA: Diagnosis not present

## 2016-06-08 DIAGNOSIS — R17 Unspecified jaundice: Secondary | ICD-10-CM | POA: Diagnosis not present

## 2016-06-08 DIAGNOSIS — D631 Anemia in chronic kidney disease: Secondary | ICD-10-CM | POA: Diagnosis not present

## 2016-06-08 DIAGNOSIS — D509 Iron deficiency anemia, unspecified: Secondary | ICD-10-CM | POA: Diagnosis not present

## 2016-06-09 DIAGNOSIS — Z79899 Other long term (current) drug therapy: Secondary | ICD-10-CM | POA: Diagnosis not present

## 2016-06-09 DIAGNOSIS — N186 End stage renal disease: Secondary | ICD-10-CM | POA: Diagnosis not present

## 2016-06-09 DIAGNOSIS — D631 Anemia in chronic kidney disease: Secondary | ICD-10-CM | POA: Diagnosis not present

## 2016-06-09 DIAGNOSIS — D509 Iron deficiency anemia, unspecified: Secondary | ICD-10-CM | POA: Diagnosis not present

## 2016-06-09 DIAGNOSIS — N2581 Secondary hyperparathyroidism of renal origin: Secondary | ICD-10-CM | POA: Diagnosis not present

## 2016-06-09 DIAGNOSIS — R17 Unspecified jaundice: Secondary | ICD-10-CM | POA: Diagnosis not present

## 2016-06-12 DIAGNOSIS — R17 Unspecified jaundice: Secondary | ICD-10-CM | POA: Diagnosis not present

## 2016-06-12 DIAGNOSIS — Z79899 Other long term (current) drug therapy: Secondary | ICD-10-CM | POA: Diagnosis not present

## 2016-06-12 DIAGNOSIS — N2581 Secondary hyperparathyroidism of renal origin: Secondary | ICD-10-CM | POA: Diagnosis not present

## 2016-06-12 DIAGNOSIS — D509 Iron deficiency anemia, unspecified: Secondary | ICD-10-CM | POA: Diagnosis not present

## 2016-06-12 DIAGNOSIS — N186 End stage renal disease: Secondary | ICD-10-CM | POA: Diagnosis not present

## 2016-06-12 DIAGNOSIS — D631 Anemia in chronic kidney disease: Secondary | ICD-10-CM | POA: Diagnosis not present

## 2016-06-13 DIAGNOSIS — D631 Anemia in chronic kidney disease: Secondary | ICD-10-CM | POA: Diagnosis not present

## 2016-06-13 DIAGNOSIS — R17 Unspecified jaundice: Secondary | ICD-10-CM | POA: Diagnosis not present

## 2016-06-13 DIAGNOSIS — Z79899 Other long term (current) drug therapy: Secondary | ICD-10-CM | POA: Diagnosis not present

## 2016-06-13 DIAGNOSIS — N2581 Secondary hyperparathyroidism of renal origin: Secondary | ICD-10-CM | POA: Diagnosis not present

## 2016-06-13 DIAGNOSIS — D509 Iron deficiency anemia, unspecified: Secondary | ICD-10-CM | POA: Diagnosis not present

## 2016-06-13 DIAGNOSIS — N186 End stage renal disease: Secondary | ICD-10-CM | POA: Diagnosis not present

## 2016-06-15 DIAGNOSIS — Z79899 Other long term (current) drug therapy: Secondary | ICD-10-CM | POA: Diagnosis not present

## 2016-06-15 DIAGNOSIS — N186 End stage renal disease: Secondary | ICD-10-CM | POA: Diagnosis not present

## 2016-06-15 DIAGNOSIS — D509 Iron deficiency anemia, unspecified: Secondary | ICD-10-CM | POA: Diagnosis not present

## 2016-06-15 DIAGNOSIS — N2581 Secondary hyperparathyroidism of renal origin: Secondary | ICD-10-CM | POA: Diagnosis not present

## 2016-06-15 DIAGNOSIS — R17 Unspecified jaundice: Secondary | ICD-10-CM | POA: Diagnosis not present

## 2016-06-15 DIAGNOSIS — D631 Anemia in chronic kidney disease: Secondary | ICD-10-CM | POA: Diagnosis not present

## 2016-06-16 DIAGNOSIS — D509 Iron deficiency anemia, unspecified: Secondary | ICD-10-CM | POA: Diagnosis not present

## 2016-06-16 DIAGNOSIS — N186 End stage renal disease: Secondary | ICD-10-CM | POA: Diagnosis not present

## 2016-06-16 DIAGNOSIS — R17 Unspecified jaundice: Secondary | ICD-10-CM | POA: Diagnosis not present

## 2016-06-16 DIAGNOSIS — N2581 Secondary hyperparathyroidism of renal origin: Secondary | ICD-10-CM | POA: Diagnosis not present

## 2016-06-16 DIAGNOSIS — D631 Anemia in chronic kidney disease: Secondary | ICD-10-CM | POA: Diagnosis not present

## 2016-06-16 DIAGNOSIS — Z79899 Other long term (current) drug therapy: Secondary | ICD-10-CM | POA: Diagnosis not present

## 2016-06-19 DIAGNOSIS — R17 Unspecified jaundice: Secondary | ICD-10-CM | POA: Diagnosis not present

## 2016-06-19 DIAGNOSIS — Z79899 Other long term (current) drug therapy: Secondary | ICD-10-CM | POA: Diagnosis not present

## 2016-06-19 DIAGNOSIS — D631 Anemia in chronic kidney disease: Secondary | ICD-10-CM | POA: Diagnosis not present

## 2016-06-19 DIAGNOSIS — N186 End stage renal disease: Secondary | ICD-10-CM | POA: Diagnosis not present

## 2016-06-19 DIAGNOSIS — N2581 Secondary hyperparathyroidism of renal origin: Secondary | ICD-10-CM | POA: Diagnosis not present

## 2016-06-19 DIAGNOSIS — D509 Iron deficiency anemia, unspecified: Secondary | ICD-10-CM | POA: Diagnosis not present

## 2016-06-20 DIAGNOSIS — N2581 Secondary hyperparathyroidism of renal origin: Secondary | ICD-10-CM | POA: Diagnosis not present

## 2016-06-20 DIAGNOSIS — Z79899 Other long term (current) drug therapy: Secondary | ICD-10-CM | POA: Diagnosis not present

## 2016-06-20 DIAGNOSIS — R17 Unspecified jaundice: Secondary | ICD-10-CM | POA: Diagnosis not present

## 2016-06-20 DIAGNOSIS — N186 End stage renal disease: Secondary | ICD-10-CM | POA: Diagnosis not present

## 2016-06-20 DIAGNOSIS — D509 Iron deficiency anemia, unspecified: Secondary | ICD-10-CM | POA: Diagnosis not present

## 2016-06-20 DIAGNOSIS — D631 Anemia in chronic kidney disease: Secondary | ICD-10-CM | POA: Diagnosis not present

## 2016-06-22 DIAGNOSIS — R17 Unspecified jaundice: Secondary | ICD-10-CM | POA: Diagnosis not present

## 2016-06-22 DIAGNOSIS — Z79899 Other long term (current) drug therapy: Secondary | ICD-10-CM | POA: Diagnosis not present

## 2016-06-22 DIAGNOSIS — N186 End stage renal disease: Secondary | ICD-10-CM | POA: Diagnosis not present

## 2016-06-22 DIAGNOSIS — D509 Iron deficiency anemia, unspecified: Secondary | ICD-10-CM | POA: Diagnosis not present

## 2016-06-22 DIAGNOSIS — D631 Anemia in chronic kidney disease: Secondary | ICD-10-CM | POA: Diagnosis not present

## 2016-06-22 DIAGNOSIS — N2581 Secondary hyperparathyroidism of renal origin: Secondary | ICD-10-CM | POA: Diagnosis not present

## 2016-06-26 DIAGNOSIS — R17 Unspecified jaundice: Secondary | ICD-10-CM | POA: Diagnosis not present

## 2016-06-26 DIAGNOSIS — N186 End stage renal disease: Secondary | ICD-10-CM | POA: Diagnosis not present

## 2016-06-26 DIAGNOSIS — N2581 Secondary hyperparathyroidism of renal origin: Secondary | ICD-10-CM | POA: Diagnosis not present

## 2016-06-26 DIAGNOSIS — Z79899 Other long term (current) drug therapy: Secondary | ICD-10-CM | POA: Diagnosis not present

## 2016-06-26 DIAGNOSIS — D509 Iron deficiency anemia, unspecified: Secondary | ICD-10-CM | POA: Diagnosis not present

## 2016-06-26 DIAGNOSIS — D631 Anemia in chronic kidney disease: Secondary | ICD-10-CM | POA: Diagnosis not present

## 2016-06-27 DIAGNOSIS — Z79899 Other long term (current) drug therapy: Secondary | ICD-10-CM | POA: Diagnosis not present

## 2016-06-27 DIAGNOSIS — N186 End stage renal disease: Secondary | ICD-10-CM | POA: Diagnosis not present

## 2016-06-27 DIAGNOSIS — D509 Iron deficiency anemia, unspecified: Secondary | ICD-10-CM | POA: Diagnosis not present

## 2016-06-27 DIAGNOSIS — D631 Anemia in chronic kidney disease: Secondary | ICD-10-CM | POA: Diagnosis not present

## 2016-06-27 DIAGNOSIS — R17 Unspecified jaundice: Secondary | ICD-10-CM | POA: Diagnosis not present

## 2016-06-27 DIAGNOSIS — N2581 Secondary hyperparathyroidism of renal origin: Secondary | ICD-10-CM | POA: Diagnosis not present

## 2016-06-29 DIAGNOSIS — R17 Unspecified jaundice: Secondary | ICD-10-CM | POA: Diagnosis not present

## 2016-06-29 DIAGNOSIS — N186 End stage renal disease: Secondary | ICD-10-CM | POA: Diagnosis not present

## 2016-06-29 DIAGNOSIS — D631 Anemia in chronic kidney disease: Secondary | ICD-10-CM | POA: Diagnosis not present

## 2016-06-29 DIAGNOSIS — D509 Iron deficiency anemia, unspecified: Secondary | ICD-10-CM | POA: Diagnosis not present

## 2016-06-29 DIAGNOSIS — N2581 Secondary hyperparathyroidism of renal origin: Secondary | ICD-10-CM | POA: Diagnosis not present

## 2016-06-29 DIAGNOSIS — Z79899 Other long term (current) drug therapy: Secondary | ICD-10-CM | POA: Diagnosis not present

## 2016-06-30 DIAGNOSIS — D509 Iron deficiency anemia, unspecified: Secondary | ICD-10-CM | POA: Diagnosis not present

## 2016-06-30 DIAGNOSIS — N2581 Secondary hyperparathyroidism of renal origin: Secondary | ICD-10-CM | POA: Diagnosis not present

## 2016-06-30 DIAGNOSIS — D631 Anemia in chronic kidney disease: Secondary | ICD-10-CM | POA: Diagnosis not present

## 2016-06-30 DIAGNOSIS — Z79899 Other long term (current) drug therapy: Secondary | ICD-10-CM | POA: Diagnosis not present

## 2016-06-30 DIAGNOSIS — R17 Unspecified jaundice: Secondary | ICD-10-CM | POA: Diagnosis not present

## 2016-06-30 DIAGNOSIS — N186 End stage renal disease: Secondary | ICD-10-CM | POA: Diagnosis not present

## 2016-07-02 DIAGNOSIS — N186 End stage renal disease: Secondary | ICD-10-CM | POA: Diagnosis not present

## 2016-07-02 DIAGNOSIS — Z79899 Other long term (current) drug therapy: Secondary | ICD-10-CM | POA: Diagnosis not present

## 2016-07-02 DIAGNOSIS — N2581 Secondary hyperparathyroidism of renal origin: Secondary | ICD-10-CM | POA: Diagnosis not present

## 2016-07-02 DIAGNOSIS — D631 Anemia in chronic kidney disease: Secondary | ICD-10-CM | POA: Diagnosis not present

## 2016-07-02 DIAGNOSIS — R17 Unspecified jaundice: Secondary | ICD-10-CM | POA: Diagnosis not present

## 2016-07-02 DIAGNOSIS — D509 Iron deficiency anemia, unspecified: Secondary | ICD-10-CM | POA: Diagnosis not present

## 2016-07-04 DIAGNOSIS — D509 Iron deficiency anemia, unspecified: Secondary | ICD-10-CM | POA: Diagnosis not present

## 2016-07-04 DIAGNOSIS — N2581 Secondary hyperparathyroidism of renal origin: Secondary | ICD-10-CM | POA: Diagnosis not present

## 2016-07-04 DIAGNOSIS — R17 Unspecified jaundice: Secondary | ICD-10-CM | POA: Diagnosis not present

## 2016-07-04 DIAGNOSIS — N186 End stage renal disease: Secondary | ICD-10-CM | POA: Diagnosis not present

## 2016-07-04 DIAGNOSIS — D631 Anemia in chronic kidney disease: Secondary | ICD-10-CM | POA: Diagnosis not present

## 2016-07-04 DIAGNOSIS — Z79899 Other long term (current) drug therapy: Secondary | ICD-10-CM | POA: Diagnosis not present

## 2016-07-06 DIAGNOSIS — Z79899 Other long term (current) drug therapy: Secondary | ICD-10-CM | POA: Diagnosis not present

## 2016-07-06 DIAGNOSIS — N186 End stage renal disease: Secondary | ICD-10-CM | POA: Diagnosis not present

## 2016-07-06 DIAGNOSIS — D509 Iron deficiency anemia, unspecified: Secondary | ICD-10-CM | POA: Diagnosis not present

## 2016-07-06 DIAGNOSIS — N2581 Secondary hyperparathyroidism of renal origin: Secondary | ICD-10-CM | POA: Diagnosis not present

## 2016-07-06 DIAGNOSIS — D631 Anemia in chronic kidney disease: Secondary | ICD-10-CM | POA: Diagnosis not present

## 2016-07-06 DIAGNOSIS — R17 Unspecified jaundice: Secondary | ICD-10-CM | POA: Diagnosis not present

## 2016-07-07 DIAGNOSIS — R17 Unspecified jaundice: Secondary | ICD-10-CM | POA: Diagnosis not present

## 2016-07-07 DIAGNOSIS — D509 Iron deficiency anemia, unspecified: Secondary | ICD-10-CM | POA: Diagnosis not present

## 2016-07-07 DIAGNOSIS — N2581 Secondary hyperparathyroidism of renal origin: Secondary | ICD-10-CM | POA: Diagnosis not present

## 2016-07-07 DIAGNOSIS — N186 End stage renal disease: Secondary | ICD-10-CM | POA: Diagnosis not present

## 2016-07-07 DIAGNOSIS — D631 Anemia in chronic kidney disease: Secondary | ICD-10-CM | POA: Diagnosis not present

## 2016-07-07 DIAGNOSIS — Z79899 Other long term (current) drug therapy: Secondary | ICD-10-CM | POA: Diagnosis not present

## 2016-07-09 DIAGNOSIS — Z79899 Other long term (current) drug therapy: Secondary | ICD-10-CM | POA: Diagnosis not present

## 2016-07-09 DIAGNOSIS — N059 Unspecified nephritic syndrome with unspecified morphologic changes: Secondary | ICD-10-CM | POA: Diagnosis not present

## 2016-07-09 DIAGNOSIS — R17 Unspecified jaundice: Secondary | ICD-10-CM | POA: Diagnosis not present

## 2016-07-09 DIAGNOSIS — D631 Anemia in chronic kidney disease: Secondary | ICD-10-CM | POA: Diagnosis not present

## 2016-07-09 DIAGNOSIS — N186 End stage renal disease: Secondary | ICD-10-CM | POA: Diagnosis not present

## 2016-07-09 DIAGNOSIS — Z992 Dependence on renal dialysis: Secondary | ICD-10-CM | POA: Diagnosis not present

## 2016-07-09 DIAGNOSIS — N2581 Secondary hyperparathyroidism of renal origin: Secondary | ICD-10-CM | POA: Diagnosis not present

## 2016-07-09 DIAGNOSIS — D509 Iron deficiency anemia, unspecified: Secondary | ICD-10-CM | POA: Diagnosis not present

## 2016-07-11 DIAGNOSIS — N186 End stage renal disease: Secondary | ICD-10-CM | POA: Diagnosis not present

## 2016-07-11 DIAGNOSIS — R17 Unspecified jaundice: Secondary | ICD-10-CM | POA: Diagnosis not present

## 2016-07-11 DIAGNOSIS — K7689 Other specified diseases of liver: Secondary | ICD-10-CM | POA: Diagnosis not present

## 2016-07-11 DIAGNOSIS — Z79899 Other long term (current) drug therapy: Secondary | ICD-10-CM | POA: Diagnosis not present

## 2016-07-13 DIAGNOSIS — K7689 Other specified diseases of liver: Secondary | ICD-10-CM | POA: Diagnosis not present

## 2016-07-13 DIAGNOSIS — Z79899 Other long term (current) drug therapy: Secondary | ICD-10-CM | POA: Diagnosis not present

## 2016-07-13 DIAGNOSIS — R17 Unspecified jaundice: Secondary | ICD-10-CM | POA: Diagnosis not present

## 2016-07-13 DIAGNOSIS — N186 End stage renal disease: Secondary | ICD-10-CM | POA: Diagnosis not present

## 2016-07-14 DIAGNOSIS — K7689 Other specified diseases of liver: Secondary | ICD-10-CM | POA: Diagnosis not present

## 2016-07-14 DIAGNOSIS — E784 Other hyperlipidemia: Secondary | ICD-10-CM | POA: Diagnosis not present

## 2016-07-14 DIAGNOSIS — Z79899 Other long term (current) drug therapy: Secondary | ICD-10-CM | POA: Diagnosis not present

## 2016-07-14 DIAGNOSIS — N186 End stage renal disease: Secondary | ICD-10-CM | POA: Diagnosis not present

## 2016-07-14 DIAGNOSIS — R17 Unspecified jaundice: Secondary | ICD-10-CM | POA: Diagnosis not present

## 2016-07-17 DIAGNOSIS — N186 End stage renal disease: Secondary | ICD-10-CM | POA: Diagnosis not present

## 2016-07-17 DIAGNOSIS — N2581 Secondary hyperparathyroidism of renal origin: Secondary | ICD-10-CM | POA: Diagnosis not present

## 2016-07-18 DIAGNOSIS — N2581 Secondary hyperparathyroidism of renal origin: Secondary | ICD-10-CM | POA: Diagnosis not present

## 2016-07-18 DIAGNOSIS — N186 End stage renal disease: Secondary | ICD-10-CM | POA: Diagnosis not present

## 2016-07-20 DIAGNOSIS — N2581 Secondary hyperparathyroidism of renal origin: Secondary | ICD-10-CM | POA: Diagnosis not present

## 2016-07-20 DIAGNOSIS — N186 End stage renal disease: Secondary | ICD-10-CM | POA: Diagnosis not present

## 2016-07-21 DIAGNOSIS — N186 End stage renal disease: Secondary | ICD-10-CM | POA: Diagnosis not present

## 2016-07-21 DIAGNOSIS — N2581 Secondary hyperparathyroidism of renal origin: Secondary | ICD-10-CM | POA: Diagnosis not present

## 2016-07-23 DIAGNOSIS — N186 End stage renal disease: Secondary | ICD-10-CM | POA: Diagnosis not present

## 2016-07-23 DIAGNOSIS — N2581 Secondary hyperparathyroidism of renal origin: Secondary | ICD-10-CM | POA: Diagnosis not present

## 2016-07-24 ENCOUNTER — Encounter: Payer: Self-pay | Admitting: Podiatry

## 2016-07-24 ENCOUNTER — Ambulatory Visit (INDEPENDENT_AMBULATORY_CARE_PROVIDER_SITE_OTHER): Payer: Medicare Other | Admitting: Podiatry

## 2016-07-24 VITALS — Ht 65.0 in | Wt 124.0 lb

## 2016-07-24 DIAGNOSIS — B351 Tinea unguium: Secondary | ICD-10-CM | POA: Diagnosis not present

## 2016-07-24 DIAGNOSIS — N2581 Secondary hyperparathyroidism of renal origin: Secondary | ICD-10-CM | POA: Diagnosis not present

## 2016-07-24 DIAGNOSIS — E1140 Type 2 diabetes mellitus with diabetic neuropathy, unspecified: Secondary | ICD-10-CM

## 2016-07-24 DIAGNOSIS — M79674 Pain in right toe(s): Secondary | ICD-10-CM | POA: Diagnosis not present

## 2016-07-24 DIAGNOSIS — N186 End stage renal disease: Secondary | ICD-10-CM | POA: Diagnosis not present

## 2016-07-24 NOTE — Progress Notes (Signed)
Patient ID: Linda Jordan, female   DOB: 05/22/1930, 83 y.o.   MRN: [id number] Complaint:  Visit Type: Patient returns to my office for continued preventative foot care services. Complaint: Patient states" my nails have grown long and thick and become painful to walk and wear shoes" Patient has been diagnosed with DM with no complications. He presents for preventative foot care services. No changes to ROS  Podiatric Exam: Vascular: dorsalis pedis and posterior tibial pulses are palpable bilateral. Capillary return is immediate. Temperature gradient is WNL. Skin turgor WNL  Sensorium: Normal Semmes Weinstein monofilament test. Normal tactile sensation bilaterally. Nail Exam: Pt has thick disfigured discolored nails with subungual debris noted bilateral entire nail hallux through fifth toenails Ulcer Exam: There is no evidence of ulcer or pre-ulcerative changes or infection. Orthopedic Exam: Muscle tone and strength are WNL. No limitations in general ROM. No crepitus or effusions noted. Foot type and digits show no abnormalities. Bony prominences are unremarkable. Toe contractures.  No pain. Skin: No Porokeratosis. No infection or ulcers  Diagnosis:  Tinea unguium, Pain in right toe, pain in left toes  Treatment & Plan Procedures and Treatment: Consent by patient was obtained for treatment procedures. The patient understood the discussion of treatment and procedures well. All questions were answered thoroughly reviewed. Debridement of mycotic and hypertrophic toenails, 1 through 5 bilateral and clearing of subungual debris. No ulceration, no infection noted.  Return Visit-Office Procedure: Patient instructed to return to the office for a follow up visit 3 months for continued evaluation and treatment.   Gardiner Barefoot DPM

## 2016-07-26 DIAGNOSIS — N186 End stage renal disease: Secondary | ICD-10-CM | POA: Diagnosis not present

## 2016-07-26 DIAGNOSIS — N2581 Secondary hyperparathyroidism of renal origin: Secondary | ICD-10-CM | POA: Diagnosis not present

## 2016-07-27 DIAGNOSIS — N2581 Secondary hyperparathyroidism of renal origin: Secondary | ICD-10-CM | POA: Diagnosis not present

## 2016-07-27 DIAGNOSIS — N186 End stage renal disease: Secondary | ICD-10-CM | POA: Diagnosis not present

## 2016-07-28 DIAGNOSIS — E039 Hypothyroidism, unspecified: Secondary | ICD-10-CM | POA: Diagnosis not present

## 2016-07-28 DIAGNOSIS — M545 Low back pain: Secondary | ICD-10-CM | POA: Diagnosis not present

## 2016-07-28 DIAGNOSIS — E1121 Type 2 diabetes mellitus with diabetic nephropathy: Secondary | ICD-10-CM | POA: Diagnosis not present

## 2016-07-28 DIAGNOSIS — N186 End stage renal disease: Secondary | ICD-10-CM | POA: Diagnosis not present

## 2016-07-28 DIAGNOSIS — K219 Gastro-esophageal reflux disease without esophagitis: Secondary | ICD-10-CM | POA: Diagnosis not present

## 2016-07-28 DIAGNOSIS — B0229 Other postherpetic nervous system involvement: Secondary | ICD-10-CM | POA: Diagnosis not present

## 2016-07-28 DIAGNOSIS — J309 Allergic rhinitis, unspecified: Secondary | ICD-10-CM | POA: Diagnosis not present

## 2016-07-28 DIAGNOSIS — E785 Hyperlipidemia, unspecified: Secondary | ICD-10-CM | POA: Diagnosis not present

## 2016-07-28 DIAGNOSIS — Z9181 History of falling: Secondary | ICD-10-CM | POA: Diagnosis not present

## 2016-07-28 DIAGNOSIS — R600 Localized edema: Secondary | ICD-10-CM | POA: Diagnosis not present

## 2016-07-28 DIAGNOSIS — J449 Chronic obstructive pulmonary disease, unspecified: Secondary | ICD-10-CM | POA: Diagnosis not present

## 2016-07-28 DIAGNOSIS — Z1389 Encounter for screening for other disorder: Secondary | ICD-10-CM | POA: Diagnosis not present

## 2016-07-29 DIAGNOSIS — N2581 Secondary hyperparathyroidism of renal origin: Secondary | ICD-10-CM | POA: Diagnosis not present

## 2016-07-29 DIAGNOSIS — N186 End stage renal disease: Secondary | ICD-10-CM | POA: Diagnosis not present

## 2016-07-30 DIAGNOSIS — N186 End stage renal disease: Secondary | ICD-10-CM | POA: Diagnosis not present

## 2016-07-30 DIAGNOSIS — N2581 Secondary hyperparathyroidism of renal origin: Secondary | ICD-10-CM | POA: Diagnosis not present

## 2016-08-01 DIAGNOSIS — N2581 Secondary hyperparathyroidism of renal origin: Secondary | ICD-10-CM | POA: Diagnosis not present

## 2016-08-01 DIAGNOSIS — N186 End stage renal disease: Secondary | ICD-10-CM | POA: Diagnosis not present

## 2016-08-02 DIAGNOSIS — N2581 Secondary hyperparathyroidism of renal origin: Secondary | ICD-10-CM | POA: Diagnosis not present

## 2016-08-02 DIAGNOSIS — N186 End stage renal disease: Secondary | ICD-10-CM | POA: Diagnosis not present

## 2016-08-04 DIAGNOSIS — N186 End stage renal disease: Secondary | ICD-10-CM | POA: Diagnosis not present

## 2016-08-04 DIAGNOSIS — N2581 Secondary hyperparathyroidism of renal origin: Secondary | ICD-10-CM | POA: Diagnosis not present

## 2016-08-06 DIAGNOSIS — N186 End stage renal disease: Secondary | ICD-10-CM | POA: Diagnosis not present

## 2016-08-06 DIAGNOSIS — N2581 Secondary hyperparathyroidism of renal origin: Secondary | ICD-10-CM | POA: Diagnosis not present

## 2016-08-07 DIAGNOSIS — N186 End stage renal disease: Secondary | ICD-10-CM | POA: Diagnosis not present

## 2016-08-07 DIAGNOSIS — N2581 Secondary hyperparathyroidism of renal origin: Secondary | ICD-10-CM | POA: Diagnosis not present

## 2016-08-08 DIAGNOSIS — N2581 Secondary hyperparathyroidism of renal origin: Secondary | ICD-10-CM | POA: Diagnosis not present

## 2016-08-08 DIAGNOSIS — N186 End stage renal disease: Secondary | ICD-10-CM | POA: Diagnosis not present

## 2016-08-09 DIAGNOSIS — R600 Localized edema: Secondary | ICD-10-CM | POA: Diagnosis not present

## 2016-08-09 DIAGNOSIS — E785 Hyperlipidemia, unspecified: Secondary | ICD-10-CM | POA: Diagnosis not present

## 2016-08-09 DIAGNOSIS — B0229 Other postherpetic nervous system involvement: Secondary | ICD-10-CM | POA: Diagnosis not present

## 2016-08-09 DIAGNOSIS — E039 Hypothyroidism, unspecified: Secondary | ICD-10-CM | POA: Diagnosis not present

## 2016-08-09 DIAGNOSIS — M545 Low back pain: Secondary | ICD-10-CM | POA: Diagnosis not present

## 2016-08-09 DIAGNOSIS — N059 Unspecified nephritic syndrome with unspecified morphologic changes: Secondary | ICD-10-CM | POA: Diagnosis not present

## 2016-08-09 DIAGNOSIS — I82409 Acute embolism and thrombosis of unspecified deep veins of unspecified lower extremity: Secondary | ICD-10-CM | POA: Diagnosis not present

## 2016-08-09 DIAGNOSIS — J309 Allergic rhinitis, unspecified: Secondary | ICD-10-CM | POA: Diagnosis not present

## 2016-08-09 DIAGNOSIS — Z992 Dependence on renal dialysis: Secondary | ICD-10-CM | POA: Diagnosis not present

## 2016-08-09 DIAGNOSIS — K219 Gastro-esophageal reflux disease without esophagitis: Secondary | ICD-10-CM | POA: Diagnosis not present

## 2016-08-09 DIAGNOSIS — E1121 Type 2 diabetes mellitus with diabetic nephropathy: Secondary | ICD-10-CM | POA: Diagnosis not present

## 2016-08-09 DIAGNOSIS — N186 End stage renal disease: Secondary | ICD-10-CM | POA: Diagnosis not present

## 2016-08-09 DIAGNOSIS — J101 Influenza due to other identified influenza virus with other respiratory manifestations: Secondary | ICD-10-CM | POA: Diagnosis not present

## 2016-08-09 DIAGNOSIS — J449 Chronic obstructive pulmonary disease, unspecified: Secondary | ICD-10-CM | POA: Diagnosis not present

## 2016-08-11 DIAGNOSIS — D509 Iron deficiency anemia, unspecified: Secondary | ICD-10-CM | POA: Diagnosis not present

## 2016-08-11 DIAGNOSIS — Z79899 Other long term (current) drug therapy: Secondary | ICD-10-CM | POA: Diagnosis not present

## 2016-08-11 DIAGNOSIS — N186 End stage renal disease: Secondary | ICD-10-CM | POA: Diagnosis not present

## 2016-08-11 DIAGNOSIS — K7689 Other specified diseases of liver: Secondary | ICD-10-CM | POA: Diagnosis not present

## 2016-08-11 DIAGNOSIS — R17 Unspecified jaundice: Secondary | ICD-10-CM | POA: Diagnosis not present

## 2016-08-11 DIAGNOSIS — D689 Coagulation defect, unspecified: Secondary | ICD-10-CM | POA: Diagnosis not present

## 2016-08-11 DIAGNOSIS — N2581 Secondary hyperparathyroidism of renal origin: Secondary | ICD-10-CM | POA: Diagnosis not present

## 2016-08-11 DIAGNOSIS — D631 Anemia in chronic kidney disease: Secondary | ICD-10-CM | POA: Diagnosis not present

## 2016-08-13 DIAGNOSIS — R17 Unspecified jaundice: Secondary | ICD-10-CM | POA: Diagnosis not present

## 2016-08-13 DIAGNOSIS — D509 Iron deficiency anemia, unspecified: Secondary | ICD-10-CM | POA: Diagnosis not present

## 2016-08-13 DIAGNOSIS — K7689 Other specified diseases of liver: Secondary | ICD-10-CM | POA: Diagnosis not present

## 2016-08-13 DIAGNOSIS — N2581 Secondary hyperparathyroidism of renal origin: Secondary | ICD-10-CM | POA: Diagnosis not present

## 2016-08-13 DIAGNOSIS — Z79899 Other long term (current) drug therapy: Secondary | ICD-10-CM | POA: Diagnosis not present

## 2016-08-13 DIAGNOSIS — N186 End stage renal disease: Secondary | ICD-10-CM | POA: Diagnosis not present

## 2016-08-14 DIAGNOSIS — N2581 Secondary hyperparathyroidism of renal origin: Secondary | ICD-10-CM | POA: Diagnosis not present

## 2016-08-14 DIAGNOSIS — Z79899 Other long term (current) drug therapy: Secondary | ICD-10-CM | POA: Diagnosis not present

## 2016-08-14 DIAGNOSIS — K7689 Other specified diseases of liver: Secondary | ICD-10-CM | POA: Diagnosis not present

## 2016-08-14 DIAGNOSIS — D509 Iron deficiency anemia, unspecified: Secondary | ICD-10-CM | POA: Diagnosis not present

## 2016-08-14 DIAGNOSIS — R17 Unspecified jaundice: Secondary | ICD-10-CM | POA: Diagnosis not present

## 2016-08-14 DIAGNOSIS — N186 End stage renal disease: Secondary | ICD-10-CM | POA: Diagnosis not present

## 2016-08-16 DIAGNOSIS — Z79899 Other long term (current) drug therapy: Secondary | ICD-10-CM | POA: Diagnosis not present

## 2016-08-16 DIAGNOSIS — K7689 Other specified diseases of liver: Secondary | ICD-10-CM | POA: Diagnosis not present

## 2016-08-16 DIAGNOSIS — R17 Unspecified jaundice: Secondary | ICD-10-CM | POA: Diagnosis not present

## 2016-08-16 DIAGNOSIS — N186 End stage renal disease: Secondary | ICD-10-CM | POA: Diagnosis not present

## 2016-08-16 DIAGNOSIS — N2581 Secondary hyperparathyroidism of renal origin: Secondary | ICD-10-CM | POA: Diagnosis not present

## 2016-08-16 DIAGNOSIS — E784 Other hyperlipidemia: Secondary | ICD-10-CM | POA: Diagnosis not present

## 2016-08-16 DIAGNOSIS — D509 Iron deficiency anemia, unspecified: Secondary | ICD-10-CM | POA: Diagnosis not present

## 2016-08-17 DIAGNOSIS — Z79899 Other long term (current) drug therapy: Secondary | ICD-10-CM | POA: Diagnosis not present

## 2016-08-17 DIAGNOSIS — R17 Unspecified jaundice: Secondary | ICD-10-CM | POA: Diagnosis not present

## 2016-08-17 DIAGNOSIS — N186 End stage renal disease: Secondary | ICD-10-CM | POA: Diagnosis not present

## 2016-08-17 DIAGNOSIS — K7689 Other specified diseases of liver: Secondary | ICD-10-CM | POA: Diagnosis not present

## 2016-08-17 DIAGNOSIS — N2581 Secondary hyperparathyroidism of renal origin: Secondary | ICD-10-CM | POA: Diagnosis not present

## 2016-08-17 DIAGNOSIS — D509 Iron deficiency anemia, unspecified: Secondary | ICD-10-CM | POA: Diagnosis not present

## 2016-08-20 DIAGNOSIS — Z79899 Other long term (current) drug therapy: Secondary | ICD-10-CM | POA: Diagnosis not present

## 2016-08-20 DIAGNOSIS — N2581 Secondary hyperparathyroidism of renal origin: Secondary | ICD-10-CM | POA: Diagnosis not present

## 2016-08-20 DIAGNOSIS — R17 Unspecified jaundice: Secondary | ICD-10-CM | POA: Diagnosis not present

## 2016-08-20 DIAGNOSIS — K7689 Other specified diseases of liver: Secondary | ICD-10-CM | POA: Diagnosis not present

## 2016-08-20 DIAGNOSIS — D509 Iron deficiency anemia, unspecified: Secondary | ICD-10-CM | POA: Diagnosis not present

## 2016-08-20 DIAGNOSIS — N186 End stage renal disease: Secondary | ICD-10-CM | POA: Diagnosis not present

## 2016-08-21 DIAGNOSIS — C50919 Malignant neoplasm of unspecified site of unspecified female breast: Secondary | ICD-10-CM | POA: Diagnosis not present

## 2016-08-21 DIAGNOSIS — Z170 Estrogen receptor positive status [ER+]: Secondary | ICD-10-CM | POA: Diagnosis not present

## 2016-08-21 DIAGNOSIS — K7689 Other specified diseases of liver: Secondary | ICD-10-CM | POA: Diagnosis not present

## 2016-08-21 DIAGNOSIS — N186 End stage renal disease: Secondary | ICD-10-CM | POA: Diagnosis not present

## 2016-08-21 DIAGNOSIS — D509 Iron deficiency anemia, unspecified: Secondary | ICD-10-CM | POA: Diagnosis not present

## 2016-08-21 DIAGNOSIS — N2581 Secondary hyperparathyroidism of renal origin: Secondary | ICD-10-CM | POA: Diagnosis not present

## 2016-08-21 DIAGNOSIS — Z853 Personal history of malignant neoplasm of breast: Secondary | ICD-10-CM | POA: Diagnosis not present

## 2016-08-21 DIAGNOSIS — Z79811 Long term (current) use of aromatase inhibitors: Secondary | ICD-10-CM | POA: Diagnosis not present

## 2016-08-21 DIAGNOSIS — Z79899 Other long term (current) drug therapy: Secondary | ICD-10-CM | POA: Diagnosis not present

## 2016-08-21 DIAGNOSIS — R17 Unspecified jaundice: Secondary | ICD-10-CM | POA: Diagnosis not present

## 2016-08-24 DIAGNOSIS — D509 Iron deficiency anemia, unspecified: Secondary | ICD-10-CM | POA: Diagnosis not present

## 2016-08-24 DIAGNOSIS — K7689 Other specified diseases of liver: Secondary | ICD-10-CM | POA: Diagnosis not present

## 2016-08-24 DIAGNOSIS — N186 End stage renal disease: Secondary | ICD-10-CM | POA: Diagnosis not present

## 2016-08-24 DIAGNOSIS — R17 Unspecified jaundice: Secondary | ICD-10-CM | POA: Diagnosis not present

## 2016-08-24 DIAGNOSIS — N2581 Secondary hyperparathyroidism of renal origin: Secondary | ICD-10-CM | POA: Diagnosis not present

## 2016-08-24 DIAGNOSIS — Z79899 Other long term (current) drug therapy: Secondary | ICD-10-CM | POA: Diagnosis not present

## 2016-08-26 DIAGNOSIS — K7689 Other specified diseases of liver: Secondary | ICD-10-CM | POA: Diagnosis not present

## 2016-08-26 DIAGNOSIS — R17 Unspecified jaundice: Secondary | ICD-10-CM | POA: Diagnosis not present

## 2016-08-26 DIAGNOSIS — D509 Iron deficiency anemia, unspecified: Secondary | ICD-10-CM | POA: Diagnosis not present

## 2016-08-26 DIAGNOSIS — N2581 Secondary hyperparathyroidism of renal origin: Secondary | ICD-10-CM | POA: Diagnosis not present

## 2016-08-26 DIAGNOSIS — N186 End stage renal disease: Secondary | ICD-10-CM | POA: Diagnosis not present

## 2016-08-26 DIAGNOSIS — Z79899 Other long term (current) drug therapy: Secondary | ICD-10-CM | POA: Diagnosis not present

## 2016-08-28 DIAGNOSIS — N186 End stage renal disease: Secondary | ICD-10-CM | POA: Diagnosis not present

## 2016-08-28 DIAGNOSIS — R17 Unspecified jaundice: Secondary | ICD-10-CM | POA: Diagnosis not present

## 2016-08-28 DIAGNOSIS — D509 Iron deficiency anemia, unspecified: Secondary | ICD-10-CM | POA: Diagnosis not present

## 2016-08-28 DIAGNOSIS — N2581 Secondary hyperparathyroidism of renal origin: Secondary | ICD-10-CM | POA: Diagnosis not present

## 2016-08-28 DIAGNOSIS — K7689 Other specified diseases of liver: Secondary | ICD-10-CM | POA: Diagnosis not present

## 2016-08-28 DIAGNOSIS — Z79899 Other long term (current) drug therapy: Secondary | ICD-10-CM | POA: Diagnosis not present

## 2016-08-29 DIAGNOSIS — D509 Iron deficiency anemia, unspecified: Secondary | ICD-10-CM | POA: Diagnosis not present

## 2016-08-29 DIAGNOSIS — K7689 Other specified diseases of liver: Secondary | ICD-10-CM | POA: Diagnosis not present

## 2016-08-29 DIAGNOSIS — Z79899 Other long term (current) drug therapy: Secondary | ICD-10-CM | POA: Diagnosis not present

## 2016-08-29 DIAGNOSIS — R17 Unspecified jaundice: Secondary | ICD-10-CM | POA: Diagnosis not present

## 2016-08-29 DIAGNOSIS — N186 End stage renal disease: Secondary | ICD-10-CM | POA: Diagnosis not present

## 2016-08-29 DIAGNOSIS — N2581 Secondary hyperparathyroidism of renal origin: Secondary | ICD-10-CM | POA: Diagnosis not present

## 2016-08-30 DIAGNOSIS — Z9889 Other specified postprocedural states: Secondary | ICD-10-CM | POA: Diagnosis not present

## 2016-08-30 DIAGNOSIS — R922 Inconclusive mammogram: Secondary | ICD-10-CM | POA: Diagnosis not present

## 2016-08-30 DIAGNOSIS — C50412 Malignant neoplasm of upper-outer quadrant of left female breast: Secondary | ICD-10-CM | POA: Diagnosis not present

## 2016-08-31 DIAGNOSIS — N2581 Secondary hyperparathyroidism of renal origin: Secondary | ICD-10-CM | POA: Diagnosis not present

## 2016-08-31 DIAGNOSIS — R17 Unspecified jaundice: Secondary | ICD-10-CM | POA: Diagnosis not present

## 2016-08-31 DIAGNOSIS — K7689 Other specified diseases of liver: Secondary | ICD-10-CM | POA: Diagnosis not present

## 2016-08-31 DIAGNOSIS — Z79899 Other long term (current) drug therapy: Secondary | ICD-10-CM | POA: Diagnosis not present

## 2016-08-31 DIAGNOSIS — N186 End stage renal disease: Secondary | ICD-10-CM | POA: Diagnosis not present

## 2016-08-31 DIAGNOSIS — D509 Iron deficiency anemia, unspecified: Secondary | ICD-10-CM | POA: Diagnosis not present

## 2016-09-02 DIAGNOSIS — D509 Iron deficiency anemia, unspecified: Secondary | ICD-10-CM | POA: Diagnosis not present

## 2016-09-02 DIAGNOSIS — N186 End stage renal disease: Secondary | ICD-10-CM | POA: Diagnosis not present

## 2016-09-02 DIAGNOSIS — N2581 Secondary hyperparathyroidism of renal origin: Secondary | ICD-10-CM | POA: Diagnosis not present

## 2016-09-02 DIAGNOSIS — Z79899 Other long term (current) drug therapy: Secondary | ICD-10-CM | POA: Diagnosis not present

## 2016-09-02 DIAGNOSIS — R17 Unspecified jaundice: Secondary | ICD-10-CM | POA: Diagnosis not present

## 2016-09-02 DIAGNOSIS — K7689 Other specified diseases of liver: Secondary | ICD-10-CM | POA: Diagnosis not present

## 2016-09-04 DIAGNOSIS — N186 End stage renal disease: Secondary | ICD-10-CM | POA: Diagnosis not present

## 2016-09-04 DIAGNOSIS — K7689 Other specified diseases of liver: Secondary | ICD-10-CM | POA: Diagnosis not present

## 2016-09-04 DIAGNOSIS — R17 Unspecified jaundice: Secondary | ICD-10-CM | POA: Diagnosis not present

## 2016-09-04 DIAGNOSIS — Z79899 Other long term (current) drug therapy: Secondary | ICD-10-CM | POA: Diagnosis not present

## 2016-09-04 DIAGNOSIS — D509 Iron deficiency anemia, unspecified: Secondary | ICD-10-CM | POA: Diagnosis not present

## 2016-09-04 DIAGNOSIS — N2581 Secondary hyperparathyroidism of renal origin: Secondary | ICD-10-CM | POA: Diagnosis not present

## 2016-09-05 DIAGNOSIS — N186 End stage renal disease: Secondary | ICD-10-CM | POA: Diagnosis not present

## 2016-09-05 DIAGNOSIS — N2581 Secondary hyperparathyroidism of renal origin: Secondary | ICD-10-CM | POA: Diagnosis not present

## 2016-09-05 DIAGNOSIS — Z79899 Other long term (current) drug therapy: Secondary | ICD-10-CM | POA: Diagnosis not present

## 2016-09-05 DIAGNOSIS — R17 Unspecified jaundice: Secondary | ICD-10-CM | POA: Diagnosis not present

## 2016-09-05 DIAGNOSIS — D509 Iron deficiency anemia, unspecified: Secondary | ICD-10-CM | POA: Diagnosis not present

## 2016-09-05 DIAGNOSIS — K7689 Other specified diseases of liver: Secondary | ICD-10-CM | POA: Diagnosis not present

## 2016-09-06 DIAGNOSIS — N186 End stage renal disease: Secondary | ICD-10-CM | POA: Diagnosis not present

## 2016-09-06 DIAGNOSIS — Z992 Dependence on renal dialysis: Secondary | ICD-10-CM | POA: Diagnosis not present

## 2016-09-06 DIAGNOSIS — N059 Unspecified nephritic syndrome with unspecified morphologic changes: Secondary | ICD-10-CM | POA: Diagnosis not present

## 2016-09-07 DIAGNOSIS — Z79899 Other long term (current) drug therapy: Secondary | ICD-10-CM | POA: Diagnosis not present

## 2016-09-07 DIAGNOSIS — D689 Coagulation defect, unspecified: Secondary | ICD-10-CM | POA: Diagnosis not present

## 2016-09-07 DIAGNOSIS — E784 Other hyperlipidemia: Secondary | ICD-10-CM | POA: Diagnosis not present

## 2016-09-07 DIAGNOSIS — N2581 Secondary hyperparathyroidism of renal origin: Secondary | ICD-10-CM | POA: Diagnosis not present

## 2016-09-07 DIAGNOSIS — D631 Anemia in chronic kidney disease: Secondary | ICD-10-CM | POA: Diagnosis not present

## 2016-09-07 DIAGNOSIS — D509 Iron deficiency anemia, unspecified: Secondary | ICD-10-CM | POA: Diagnosis not present

## 2016-09-07 DIAGNOSIS — N186 End stage renal disease: Secondary | ICD-10-CM | POA: Diagnosis not present

## 2016-09-07 DIAGNOSIS — R17 Unspecified jaundice: Secondary | ICD-10-CM | POA: Diagnosis not present

## 2016-09-09 DIAGNOSIS — N186 End stage renal disease: Secondary | ICD-10-CM | POA: Diagnosis not present

## 2016-09-09 DIAGNOSIS — R17 Unspecified jaundice: Secondary | ICD-10-CM | POA: Diagnosis not present

## 2016-09-09 DIAGNOSIS — N2581 Secondary hyperparathyroidism of renal origin: Secondary | ICD-10-CM | POA: Diagnosis not present

## 2016-09-09 DIAGNOSIS — Z79899 Other long term (current) drug therapy: Secondary | ICD-10-CM | POA: Diagnosis not present

## 2016-09-09 DIAGNOSIS — D509 Iron deficiency anemia, unspecified: Secondary | ICD-10-CM | POA: Diagnosis not present

## 2016-09-09 DIAGNOSIS — D631 Anemia in chronic kidney disease: Secondary | ICD-10-CM | POA: Diagnosis not present

## 2016-09-11 DIAGNOSIS — N2581 Secondary hyperparathyroidism of renal origin: Secondary | ICD-10-CM | POA: Diagnosis not present

## 2016-09-11 DIAGNOSIS — R17 Unspecified jaundice: Secondary | ICD-10-CM | POA: Diagnosis not present

## 2016-09-11 DIAGNOSIS — N186 End stage renal disease: Secondary | ICD-10-CM | POA: Diagnosis not present

## 2016-09-11 DIAGNOSIS — Z79899 Other long term (current) drug therapy: Secondary | ICD-10-CM | POA: Diagnosis not present

## 2016-09-11 DIAGNOSIS — D509 Iron deficiency anemia, unspecified: Secondary | ICD-10-CM | POA: Diagnosis not present

## 2016-09-11 DIAGNOSIS — D631 Anemia in chronic kidney disease: Secondary | ICD-10-CM | POA: Diagnosis not present

## 2016-09-12 DIAGNOSIS — D631 Anemia in chronic kidney disease: Secondary | ICD-10-CM | POA: Diagnosis not present

## 2016-09-12 DIAGNOSIS — D509 Iron deficiency anemia, unspecified: Secondary | ICD-10-CM | POA: Diagnosis not present

## 2016-09-12 DIAGNOSIS — R17 Unspecified jaundice: Secondary | ICD-10-CM | POA: Diagnosis not present

## 2016-09-12 DIAGNOSIS — N186 End stage renal disease: Secondary | ICD-10-CM | POA: Diagnosis not present

## 2016-09-12 DIAGNOSIS — N2581 Secondary hyperparathyroidism of renal origin: Secondary | ICD-10-CM | POA: Diagnosis not present

## 2016-09-12 DIAGNOSIS — Z79899 Other long term (current) drug therapy: Secondary | ICD-10-CM | POA: Diagnosis not present

## 2016-09-14 DIAGNOSIS — R17 Unspecified jaundice: Secondary | ICD-10-CM | POA: Diagnosis not present

## 2016-09-14 DIAGNOSIS — N2581 Secondary hyperparathyroidism of renal origin: Secondary | ICD-10-CM | POA: Diagnosis not present

## 2016-09-14 DIAGNOSIS — D631 Anemia in chronic kidney disease: Secondary | ICD-10-CM | POA: Diagnosis not present

## 2016-09-14 DIAGNOSIS — D509 Iron deficiency anemia, unspecified: Secondary | ICD-10-CM | POA: Diagnosis not present

## 2016-09-14 DIAGNOSIS — N186 End stage renal disease: Secondary | ICD-10-CM | POA: Diagnosis not present

## 2016-09-14 DIAGNOSIS — Z79899 Other long term (current) drug therapy: Secondary | ICD-10-CM | POA: Diagnosis not present

## 2016-09-16 DIAGNOSIS — Z79899 Other long term (current) drug therapy: Secondary | ICD-10-CM | POA: Diagnosis not present

## 2016-09-16 DIAGNOSIS — N2581 Secondary hyperparathyroidism of renal origin: Secondary | ICD-10-CM | POA: Diagnosis not present

## 2016-09-16 DIAGNOSIS — R17 Unspecified jaundice: Secondary | ICD-10-CM | POA: Diagnosis not present

## 2016-09-16 DIAGNOSIS — N186 End stage renal disease: Secondary | ICD-10-CM | POA: Diagnosis not present

## 2016-09-16 DIAGNOSIS — D509 Iron deficiency anemia, unspecified: Secondary | ICD-10-CM | POA: Diagnosis not present

## 2016-09-16 DIAGNOSIS — D631 Anemia in chronic kidney disease: Secondary | ICD-10-CM | POA: Diagnosis not present

## 2016-09-18 DIAGNOSIS — R17 Unspecified jaundice: Secondary | ICD-10-CM | POA: Diagnosis not present

## 2016-09-18 DIAGNOSIS — N2581 Secondary hyperparathyroidism of renal origin: Secondary | ICD-10-CM | POA: Diagnosis not present

## 2016-09-18 DIAGNOSIS — D509 Iron deficiency anemia, unspecified: Secondary | ICD-10-CM | POA: Diagnosis not present

## 2016-09-18 DIAGNOSIS — Z79899 Other long term (current) drug therapy: Secondary | ICD-10-CM | POA: Diagnosis not present

## 2016-09-18 DIAGNOSIS — D631 Anemia in chronic kidney disease: Secondary | ICD-10-CM | POA: Diagnosis not present

## 2016-09-18 DIAGNOSIS — N186 End stage renal disease: Secondary | ICD-10-CM | POA: Diagnosis not present

## 2016-09-19 DIAGNOSIS — D509 Iron deficiency anemia, unspecified: Secondary | ICD-10-CM | POA: Diagnosis not present

## 2016-09-19 DIAGNOSIS — Z79899 Other long term (current) drug therapy: Secondary | ICD-10-CM | POA: Diagnosis not present

## 2016-09-19 DIAGNOSIS — N2581 Secondary hyperparathyroidism of renal origin: Secondary | ICD-10-CM | POA: Diagnosis not present

## 2016-09-19 DIAGNOSIS — N186 End stage renal disease: Secondary | ICD-10-CM | POA: Diagnosis not present

## 2016-09-19 DIAGNOSIS — D631 Anemia in chronic kidney disease: Secondary | ICD-10-CM | POA: Diagnosis not present

## 2016-09-19 DIAGNOSIS — R17 Unspecified jaundice: Secondary | ICD-10-CM | POA: Diagnosis not present

## 2016-09-21 DIAGNOSIS — D631 Anemia in chronic kidney disease: Secondary | ICD-10-CM | POA: Diagnosis not present

## 2016-09-21 DIAGNOSIS — D509 Iron deficiency anemia, unspecified: Secondary | ICD-10-CM | POA: Diagnosis not present

## 2016-09-21 DIAGNOSIS — N2581 Secondary hyperparathyroidism of renal origin: Secondary | ICD-10-CM | POA: Diagnosis not present

## 2016-09-21 DIAGNOSIS — N186 End stage renal disease: Secondary | ICD-10-CM | POA: Diagnosis not present

## 2016-09-21 DIAGNOSIS — R17 Unspecified jaundice: Secondary | ICD-10-CM | POA: Diagnosis not present

## 2016-09-21 DIAGNOSIS — Z79899 Other long term (current) drug therapy: Secondary | ICD-10-CM | POA: Diagnosis not present

## 2016-09-22 DIAGNOSIS — D631 Anemia in chronic kidney disease: Secondary | ICD-10-CM | POA: Diagnosis not present

## 2016-09-22 DIAGNOSIS — D509 Iron deficiency anemia, unspecified: Secondary | ICD-10-CM | POA: Diagnosis not present

## 2016-09-22 DIAGNOSIS — Z79899 Other long term (current) drug therapy: Secondary | ICD-10-CM | POA: Diagnosis not present

## 2016-09-22 DIAGNOSIS — N186 End stage renal disease: Secondary | ICD-10-CM | POA: Diagnosis not present

## 2016-09-22 DIAGNOSIS — R17 Unspecified jaundice: Secondary | ICD-10-CM | POA: Diagnosis not present

## 2016-09-22 DIAGNOSIS — N2581 Secondary hyperparathyroidism of renal origin: Secondary | ICD-10-CM | POA: Diagnosis not present

## 2016-09-24 DIAGNOSIS — D509 Iron deficiency anemia, unspecified: Secondary | ICD-10-CM | POA: Diagnosis not present

## 2016-09-24 DIAGNOSIS — N2581 Secondary hyperparathyroidism of renal origin: Secondary | ICD-10-CM | POA: Diagnosis not present

## 2016-09-24 DIAGNOSIS — R17 Unspecified jaundice: Secondary | ICD-10-CM | POA: Diagnosis not present

## 2016-09-24 DIAGNOSIS — D631 Anemia in chronic kidney disease: Secondary | ICD-10-CM | POA: Diagnosis not present

## 2016-09-24 DIAGNOSIS — Z79899 Other long term (current) drug therapy: Secondary | ICD-10-CM | POA: Diagnosis not present

## 2016-09-24 DIAGNOSIS — N186 End stage renal disease: Secondary | ICD-10-CM | POA: Diagnosis not present

## 2016-09-25 DIAGNOSIS — D631 Anemia in chronic kidney disease: Secondary | ICD-10-CM | POA: Diagnosis not present

## 2016-09-25 DIAGNOSIS — M545 Low back pain: Secondary | ICD-10-CM | POA: Diagnosis not present

## 2016-09-25 DIAGNOSIS — I10 Essential (primary) hypertension: Secondary | ICD-10-CM | POA: Diagnosis not present

## 2016-09-25 DIAGNOSIS — D509 Iron deficiency anemia, unspecified: Secondary | ICD-10-CM | POA: Diagnosis not present

## 2016-09-25 DIAGNOSIS — N2581 Secondary hyperparathyroidism of renal origin: Secondary | ICD-10-CM | POA: Diagnosis not present

## 2016-09-25 DIAGNOSIS — I82409 Acute embolism and thrombosis of unspecified deep veins of unspecified lower extremity: Secondary | ICD-10-CM | POA: Diagnosis not present

## 2016-09-25 DIAGNOSIS — K219 Gastro-esophageal reflux disease without esophagitis: Secondary | ICD-10-CM | POA: Diagnosis not present

## 2016-09-25 DIAGNOSIS — R600 Localized edema: Secondary | ICD-10-CM | POA: Diagnosis not present

## 2016-09-25 DIAGNOSIS — J449 Chronic obstructive pulmonary disease, unspecified: Secondary | ICD-10-CM | POA: Diagnosis not present

## 2016-09-25 DIAGNOSIS — E039 Hypothyroidism, unspecified: Secondary | ICD-10-CM | POA: Diagnosis not present

## 2016-09-25 DIAGNOSIS — N186 End stage renal disease: Secondary | ICD-10-CM | POA: Diagnosis not present

## 2016-09-25 DIAGNOSIS — Z79899 Other long term (current) drug therapy: Secondary | ICD-10-CM | POA: Diagnosis not present

## 2016-09-25 DIAGNOSIS — B0229 Other postherpetic nervous system involvement: Secondary | ICD-10-CM | POA: Diagnosis not present

## 2016-09-25 DIAGNOSIS — E785 Hyperlipidemia, unspecified: Secondary | ICD-10-CM | POA: Diagnosis not present

## 2016-09-25 DIAGNOSIS — R17 Unspecified jaundice: Secondary | ICD-10-CM | POA: Diagnosis not present

## 2016-09-25 DIAGNOSIS — L309 Dermatitis, unspecified: Secondary | ICD-10-CM | POA: Diagnosis not present

## 2016-09-25 DIAGNOSIS — E1121 Type 2 diabetes mellitus with diabetic nephropathy: Secondary | ICD-10-CM | POA: Diagnosis not present

## 2016-09-27 DIAGNOSIS — N186 End stage renal disease: Secondary | ICD-10-CM | POA: Diagnosis not present

## 2016-09-27 DIAGNOSIS — N2581 Secondary hyperparathyroidism of renal origin: Secondary | ICD-10-CM | POA: Diagnosis not present

## 2016-09-27 DIAGNOSIS — D509 Iron deficiency anemia, unspecified: Secondary | ICD-10-CM | POA: Diagnosis not present

## 2016-09-27 DIAGNOSIS — D631 Anemia in chronic kidney disease: Secondary | ICD-10-CM | POA: Diagnosis not present

## 2016-09-27 DIAGNOSIS — Z79899 Other long term (current) drug therapy: Secondary | ICD-10-CM | POA: Diagnosis not present

## 2016-09-27 DIAGNOSIS — R17 Unspecified jaundice: Secondary | ICD-10-CM | POA: Diagnosis not present

## 2016-09-28 DIAGNOSIS — D509 Iron deficiency anemia, unspecified: Secondary | ICD-10-CM | POA: Diagnosis not present

## 2016-09-28 DIAGNOSIS — N186 End stage renal disease: Secondary | ICD-10-CM | POA: Diagnosis not present

## 2016-09-28 DIAGNOSIS — R17 Unspecified jaundice: Secondary | ICD-10-CM | POA: Diagnosis not present

## 2016-09-28 DIAGNOSIS — D631 Anemia in chronic kidney disease: Secondary | ICD-10-CM | POA: Diagnosis not present

## 2016-09-28 DIAGNOSIS — Z79899 Other long term (current) drug therapy: Secondary | ICD-10-CM | POA: Diagnosis not present

## 2016-09-28 DIAGNOSIS — N2581 Secondary hyperparathyroidism of renal origin: Secondary | ICD-10-CM | POA: Diagnosis not present

## 2016-09-29 DIAGNOSIS — Z136 Encounter for screening for cardiovascular disorders: Secondary | ICD-10-CM | POA: Diagnosis not present

## 2016-09-29 DIAGNOSIS — Z0000 Encounter for general adult medical examination without abnormal findings: Secondary | ICD-10-CM | POA: Diagnosis not present

## 2016-09-29 DIAGNOSIS — Z1389 Encounter for screening for other disorder: Secondary | ICD-10-CM | POA: Diagnosis not present

## 2016-09-29 DIAGNOSIS — Z9181 History of falling: Secondary | ICD-10-CM | POA: Diagnosis not present

## 2016-09-30 DIAGNOSIS — D631 Anemia in chronic kidney disease: Secondary | ICD-10-CM | POA: Diagnosis not present

## 2016-09-30 DIAGNOSIS — D509 Iron deficiency anemia, unspecified: Secondary | ICD-10-CM | POA: Diagnosis not present

## 2016-09-30 DIAGNOSIS — R17 Unspecified jaundice: Secondary | ICD-10-CM | POA: Diagnosis not present

## 2016-09-30 DIAGNOSIS — Z79899 Other long term (current) drug therapy: Secondary | ICD-10-CM | POA: Diagnosis not present

## 2016-09-30 DIAGNOSIS — N186 End stage renal disease: Secondary | ICD-10-CM | POA: Diagnosis not present

## 2016-09-30 DIAGNOSIS — N2581 Secondary hyperparathyroidism of renal origin: Secondary | ICD-10-CM | POA: Diagnosis not present

## 2016-10-01 DIAGNOSIS — R17 Unspecified jaundice: Secondary | ICD-10-CM | POA: Diagnosis not present

## 2016-10-01 DIAGNOSIS — N2581 Secondary hyperparathyroidism of renal origin: Secondary | ICD-10-CM | POA: Diagnosis not present

## 2016-10-01 DIAGNOSIS — D631 Anemia in chronic kidney disease: Secondary | ICD-10-CM | POA: Diagnosis not present

## 2016-10-01 DIAGNOSIS — D509 Iron deficiency anemia, unspecified: Secondary | ICD-10-CM | POA: Diagnosis not present

## 2016-10-01 DIAGNOSIS — N186 End stage renal disease: Secondary | ICD-10-CM | POA: Diagnosis not present

## 2016-10-01 DIAGNOSIS — Z79899 Other long term (current) drug therapy: Secondary | ICD-10-CM | POA: Diagnosis not present

## 2016-10-02 DIAGNOSIS — H26493 Other secondary cataract, bilateral: Secondary | ICD-10-CM | POA: Diagnosis not present

## 2016-10-03 DIAGNOSIS — N186 End stage renal disease: Secondary | ICD-10-CM | POA: Diagnosis not present

## 2016-10-03 DIAGNOSIS — N2581 Secondary hyperparathyroidism of renal origin: Secondary | ICD-10-CM | POA: Diagnosis not present

## 2016-10-03 DIAGNOSIS — D631 Anemia in chronic kidney disease: Secondary | ICD-10-CM | POA: Diagnosis not present

## 2016-10-03 DIAGNOSIS — D509 Iron deficiency anemia, unspecified: Secondary | ICD-10-CM | POA: Diagnosis not present

## 2016-10-03 DIAGNOSIS — Z79899 Other long term (current) drug therapy: Secondary | ICD-10-CM | POA: Diagnosis not present

## 2016-10-03 DIAGNOSIS — R17 Unspecified jaundice: Secondary | ICD-10-CM | POA: Diagnosis not present

## 2016-10-04 DIAGNOSIS — N186 End stage renal disease: Secondary | ICD-10-CM | POA: Diagnosis not present

## 2016-10-04 DIAGNOSIS — D631 Anemia in chronic kidney disease: Secondary | ICD-10-CM | POA: Diagnosis not present

## 2016-10-04 DIAGNOSIS — Z79899 Other long term (current) drug therapy: Secondary | ICD-10-CM | POA: Diagnosis not present

## 2016-10-04 DIAGNOSIS — N2581 Secondary hyperparathyroidism of renal origin: Secondary | ICD-10-CM | POA: Diagnosis not present

## 2016-10-04 DIAGNOSIS — R17 Unspecified jaundice: Secondary | ICD-10-CM | POA: Diagnosis not present

## 2016-10-04 DIAGNOSIS — D509 Iron deficiency anemia, unspecified: Secondary | ICD-10-CM | POA: Diagnosis not present

## 2016-10-06 DIAGNOSIS — N2581 Secondary hyperparathyroidism of renal origin: Secondary | ICD-10-CM | POA: Diagnosis not present

## 2016-10-06 DIAGNOSIS — D631 Anemia in chronic kidney disease: Secondary | ICD-10-CM | POA: Diagnosis not present

## 2016-10-06 DIAGNOSIS — D509 Iron deficiency anemia, unspecified: Secondary | ICD-10-CM | POA: Diagnosis not present

## 2016-10-06 DIAGNOSIS — N186 End stage renal disease: Secondary | ICD-10-CM | POA: Diagnosis not present

## 2016-10-06 DIAGNOSIS — R17 Unspecified jaundice: Secondary | ICD-10-CM | POA: Diagnosis not present

## 2016-10-06 DIAGNOSIS — Z79899 Other long term (current) drug therapy: Secondary | ICD-10-CM | POA: Diagnosis not present

## 2016-10-07 DIAGNOSIS — R17 Unspecified jaundice: Secondary | ICD-10-CM | POA: Diagnosis not present

## 2016-10-07 DIAGNOSIS — N2581 Secondary hyperparathyroidism of renal origin: Secondary | ICD-10-CM | POA: Diagnosis not present

## 2016-10-07 DIAGNOSIS — N059 Unspecified nephritic syndrome with unspecified morphologic changes: Secondary | ICD-10-CM | POA: Diagnosis not present

## 2016-10-07 DIAGNOSIS — Z79899 Other long term (current) drug therapy: Secondary | ICD-10-CM | POA: Diagnosis not present

## 2016-10-07 DIAGNOSIS — Z992 Dependence on renal dialysis: Secondary | ICD-10-CM | POA: Diagnosis not present

## 2016-10-07 DIAGNOSIS — D509 Iron deficiency anemia, unspecified: Secondary | ICD-10-CM | POA: Diagnosis not present

## 2016-10-07 DIAGNOSIS — D631 Anemia in chronic kidney disease: Secondary | ICD-10-CM | POA: Diagnosis not present

## 2016-10-07 DIAGNOSIS — N186 End stage renal disease: Secondary | ICD-10-CM | POA: Diagnosis not present

## 2016-10-09 DIAGNOSIS — N186 End stage renal disease: Secondary | ICD-10-CM | POA: Diagnosis not present

## 2016-10-09 DIAGNOSIS — E784 Other hyperlipidemia: Secondary | ICD-10-CM | POA: Diagnosis not present

## 2016-10-09 DIAGNOSIS — D631 Anemia in chronic kidney disease: Secondary | ICD-10-CM | POA: Diagnosis not present

## 2016-10-09 DIAGNOSIS — D689 Coagulation defect, unspecified: Secondary | ICD-10-CM | POA: Diagnosis not present

## 2016-10-09 DIAGNOSIS — N2581 Secondary hyperparathyroidism of renal origin: Secondary | ICD-10-CM | POA: Diagnosis not present

## 2016-10-09 DIAGNOSIS — Z79899 Other long term (current) drug therapy: Secondary | ICD-10-CM | POA: Diagnosis not present

## 2016-10-10 DIAGNOSIS — N2581 Secondary hyperparathyroidism of renal origin: Secondary | ICD-10-CM | POA: Diagnosis not present

## 2016-10-10 DIAGNOSIS — N186 End stage renal disease: Secondary | ICD-10-CM | POA: Diagnosis not present

## 2016-10-10 DIAGNOSIS — D631 Anemia in chronic kidney disease: Secondary | ICD-10-CM | POA: Diagnosis not present

## 2016-10-10 DIAGNOSIS — D689 Coagulation defect, unspecified: Secondary | ICD-10-CM | POA: Diagnosis not present

## 2016-10-10 DIAGNOSIS — Z79899 Other long term (current) drug therapy: Secondary | ICD-10-CM | POA: Diagnosis not present

## 2016-10-12 DIAGNOSIS — D631 Anemia in chronic kidney disease: Secondary | ICD-10-CM | POA: Diagnosis not present

## 2016-10-12 DIAGNOSIS — N186 End stage renal disease: Secondary | ICD-10-CM | POA: Diagnosis not present

## 2016-10-12 DIAGNOSIS — N2581 Secondary hyperparathyroidism of renal origin: Secondary | ICD-10-CM | POA: Diagnosis not present

## 2016-10-12 DIAGNOSIS — D689 Coagulation defect, unspecified: Secondary | ICD-10-CM | POA: Diagnosis not present

## 2016-10-12 DIAGNOSIS — Z79899 Other long term (current) drug therapy: Secondary | ICD-10-CM | POA: Diagnosis not present

## 2016-10-13 DIAGNOSIS — Z79899 Other long term (current) drug therapy: Secondary | ICD-10-CM | POA: Diagnosis not present

## 2016-10-13 DIAGNOSIS — N2581 Secondary hyperparathyroidism of renal origin: Secondary | ICD-10-CM | POA: Diagnosis not present

## 2016-10-13 DIAGNOSIS — D689 Coagulation defect, unspecified: Secondary | ICD-10-CM | POA: Diagnosis not present

## 2016-10-13 DIAGNOSIS — D631 Anemia in chronic kidney disease: Secondary | ICD-10-CM | POA: Diagnosis not present

## 2016-10-13 DIAGNOSIS — N186 End stage renal disease: Secondary | ICD-10-CM | POA: Diagnosis not present

## 2016-10-15 DIAGNOSIS — N2581 Secondary hyperparathyroidism of renal origin: Secondary | ICD-10-CM | POA: Diagnosis not present

## 2016-10-15 DIAGNOSIS — D689 Coagulation defect, unspecified: Secondary | ICD-10-CM | POA: Diagnosis not present

## 2016-10-15 DIAGNOSIS — D631 Anemia in chronic kidney disease: Secondary | ICD-10-CM | POA: Diagnosis not present

## 2016-10-15 DIAGNOSIS — Z79899 Other long term (current) drug therapy: Secondary | ICD-10-CM | POA: Diagnosis not present

## 2016-10-15 DIAGNOSIS — N186 End stage renal disease: Secondary | ICD-10-CM | POA: Diagnosis not present

## 2016-10-16 ENCOUNTER — Ambulatory Visit: Payer: Medicare Other | Admitting: Podiatry

## 2016-10-16 DIAGNOSIS — N2581 Secondary hyperparathyroidism of renal origin: Secondary | ICD-10-CM | POA: Diagnosis not present

## 2016-10-16 DIAGNOSIS — N186 End stage renal disease: Secondary | ICD-10-CM | POA: Diagnosis not present

## 2016-10-16 DIAGNOSIS — Z79899 Other long term (current) drug therapy: Secondary | ICD-10-CM | POA: Diagnosis not present

## 2016-10-16 DIAGNOSIS — D631 Anemia in chronic kidney disease: Secondary | ICD-10-CM | POA: Diagnosis not present

## 2016-10-16 DIAGNOSIS — D689 Coagulation defect, unspecified: Secondary | ICD-10-CM | POA: Diagnosis not present

## 2016-10-18 DIAGNOSIS — D631 Anemia in chronic kidney disease: Secondary | ICD-10-CM | POA: Diagnosis not present

## 2016-10-18 DIAGNOSIS — N186 End stage renal disease: Secondary | ICD-10-CM | POA: Diagnosis not present

## 2016-10-18 DIAGNOSIS — Z79899 Other long term (current) drug therapy: Secondary | ICD-10-CM | POA: Diagnosis not present

## 2016-10-18 DIAGNOSIS — D689 Coagulation defect, unspecified: Secondary | ICD-10-CM | POA: Diagnosis not present

## 2016-10-18 DIAGNOSIS — N2581 Secondary hyperparathyroidism of renal origin: Secondary | ICD-10-CM | POA: Diagnosis not present

## 2016-10-20 DIAGNOSIS — N186 End stage renal disease: Secondary | ICD-10-CM | POA: Diagnosis not present

## 2016-10-20 DIAGNOSIS — D631 Anemia in chronic kidney disease: Secondary | ICD-10-CM | POA: Diagnosis not present

## 2016-10-20 DIAGNOSIS — Z79899 Other long term (current) drug therapy: Secondary | ICD-10-CM | POA: Diagnosis not present

## 2016-10-20 DIAGNOSIS — D689 Coagulation defect, unspecified: Secondary | ICD-10-CM | POA: Diagnosis not present

## 2016-10-20 DIAGNOSIS — N2581 Secondary hyperparathyroidism of renal origin: Secondary | ICD-10-CM | POA: Diagnosis not present

## 2016-10-21 DIAGNOSIS — Z79899 Other long term (current) drug therapy: Secondary | ICD-10-CM | POA: Diagnosis not present

## 2016-10-21 DIAGNOSIS — N186 End stage renal disease: Secondary | ICD-10-CM | POA: Diagnosis not present

## 2016-10-21 DIAGNOSIS — N2581 Secondary hyperparathyroidism of renal origin: Secondary | ICD-10-CM | POA: Diagnosis not present

## 2016-10-21 DIAGNOSIS — D689 Coagulation defect, unspecified: Secondary | ICD-10-CM | POA: Diagnosis not present

## 2016-10-21 DIAGNOSIS — D631 Anemia in chronic kidney disease: Secondary | ICD-10-CM | POA: Diagnosis not present

## 2016-10-22 DIAGNOSIS — Z79899 Other long term (current) drug therapy: Secondary | ICD-10-CM | POA: Diagnosis not present

## 2016-10-22 DIAGNOSIS — N186 End stage renal disease: Secondary | ICD-10-CM | POA: Diagnosis not present

## 2016-10-22 DIAGNOSIS — N2581 Secondary hyperparathyroidism of renal origin: Secondary | ICD-10-CM | POA: Diagnosis not present

## 2016-10-22 DIAGNOSIS — D689 Coagulation defect, unspecified: Secondary | ICD-10-CM | POA: Diagnosis not present

## 2016-10-22 DIAGNOSIS — D631 Anemia in chronic kidney disease: Secondary | ICD-10-CM | POA: Diagnosis not present

## 2016-10-24 DIAGNOSIS — N2581 Secondary hyperparathyroidism of renal origin: Secondary | ICD-10-CM | POA: Diagnosis not present

## 2016-10-24 DIAGNOSIS — N186 End stage renal disease: Secondary | ICD-10-CM | POA: Diagnosis not present

## 2016-10-24 DIAGNOSIS — Z79899 Other long term (current) drug therapy: Secondary | ICD-10-CM | POA: Diagnosis not present

## 2016-10-24 DIAGNOSIS — D631 Anemia in chronic kidney disease: Secondary | ICD-10-CM | POA: Diagnosis not present

## 2016-10-24 DIAGNOSIS — D689 Coagulation defect, unspecified: Secondary | ICD-10-CM | POA: Diagnosis not present

## 2016-10-25 DIAGNOSIS — Z79899 Other long term (current) drug therapy: Secondary | ICD-10-CM | POA: Diagnosis not present

## 2016-10-25 DIAGNOSIS — D689 Coagulation defect, unspecified: Secondary | ICD-10-CM | POA: Diagnosis not present

## 2016-10-25 DIAGNOSIS — D631 Anemia in chronic kidney disease: Secondary | ICD-10-CM | POA: Diagnosis not present

## 2016-10-25 DIAGNOSIS — N186 End stage renal disease: Secondary | ICD-10-CM | POA: Diagnosis not present

## 2016-10-25 DIAGNOSIS — N2581 Secondary hyperparathyroidism of renal origin: Secondary | ICD-10-CM | POA: Diagnosis not present

## 2016-10-27 DIAGNOSIS — Z79899 Other long term (current) drug therapy: Secondary | ICD-10-CM | POA: Diagnosis not present

## 2016-10-27 DIAGNOSIS — D631 Anemia in chronic kidney disease: Secondary | ICD-10-CM | POA: Diagnosis not present

## 2016-10-27 DIAGNOSIS — D689 Coagulation defect, unspecified: Secondary | ICD-10-CM | POA: Diagnosis not present

## 2016-10-27 DIAGNOSIS — N186 End stage renal disease: Secondary | ICD-10-CM | POA: Diagnosis not present

## 2016-10-27 DIAGNOSIS — N2581 Secondary hyperparathyroidism of renal origin: Secondary | ICD-10-CM | POA: Diagnosis not present

## 2016-10-28 DIAGNOSIS — D631 Anemia in chronic kidney disease: Secondary | ICD-10-CM | POA: Diagnosis not present

## 2016-10-28 DIAGNOSIS — N2581 Secondary hyperparathyroidism of renal origin: Secondary | ICD-10-CM | POA: Diagnosis not present

## 2016-10-28 DIAGNOSIS — N186 End stage renal disease: Secondary | ICD-10-CM | POA: Diagnosis not present

## 2016-10-28 DIAGNOSIS — D689 Coagulation defect, unspecified: Secondary | ICD-10-CM | POA: Diagnosis not present

## 2016-10-28 DIAGNOSIS — Z79899 Other long term (current) drug therapy: Secondary | ICD-10-CM | POA: Diagnosis not present

## 2016-10-30 DIAGNOSIS — N186 End stage renal disease: Secondary | ICD-10-CM | POA: Diagnosis not present

## 2016-10-30 DIAGNOSIS — N2581 Secondary hyperparathyroidism of renal origin: Secondary | ICD-10-CM | POA: Diagnosis not present

## 2016-10-30 DIAGNOSIS — D689 Coagulation defect, unspecified: Secondary | ICD-10-CM | POA: Diagnosis not present

## 2016-10-30 DIAGNOSIS — D631 Anemia in chronic kidney disease: Secondary | ICD-10-CM | POA: Diagnosis not present

## 2016-10-30 DIAGNOSIS — Z79899 Other long term (current) drug therapy: Secondary | ICD-10-CM | POA: Diagnosis not present

## 2016-10-31 DIAGNOSIS — D631 Anemia in chronic kidney disease: Secondary | ICD-10-CM | POA: Diagnosis not present

## 2016-10-31 DIAGNOSIS — D689 Coagulation defect, unspecified: Secondary | ICD-10-CM | POA: Diagnosis not present

## 2016-10-31 DIAGNOSIS — N2581 Secondary hyperparathyroidism of renal origin: Secondary | ICD-10-CM | POA: Diagnosis not present

## 2016-10-31 DIAGNOSIS — Z79899 Other long term (current) drug therapy: Secondary | ICD-10-CM | POA: Diagnosis not present

## 2016-10-31 DIAGNOSIS — N186 End stage renal disease: Secondary | ICD-10-CM | POA: Diagnosis not present

## 2016-11-01 ENCOUNTER — Ambulatory Visit (INDEPENDENT_AMBULATORY_CARE_PROVIDER_SITE_OTHER): Payer: Medicare Other | Admitting: Sports Medicine

## 2016-11-01 ENCOUNTER — Encounter: Payer: Self-pay | Admitting: Sports Medicine

## 2016-11-01 DIAGNOSIS — E1140 Type 2 diabetes mellitus with diabetic neuropathy, unspecified: Secondary | ICD-10-CM

## 2016-11-01 DIAGNOSIS — B351 Tinea unguium: Secondary | ICD-10-CM | POA: Diagnosis not present

## 2016-11-01 DIAGNOSIS — M79674 Pain in right toe(s): Secondary | ICD-10-CM

## 2016-11-01 NOTE — Progress Notes (Signed)
Subjective: Linda Jordan is a 83 y.o. female patient with history of diabetes who presents to office today complaining of long, painful nails  while ambulating in shoes; unable to trim. Patient states that the glucose reading this morning was not recorded; not on meds; last A1c 3.7. Patient denies any new changes in medication or new problems. Patient denies any new cramping, numbness, burning or tingling in the legs. Admits to history of neuropathy and not being able to feel feet.   Patient Active Problem List   Diagnosis Date Noted  . Thyroid disease 05/28/2013  . Hypertension 05/28/2013  . Arthritis 02/16/2011  . Bilateral swelling of feet 02/16/2011  . Mycotic toenails 02/16/2011   Current Outpatient Prescriptions on File Prior to Visit  Medication Sig Dispense Refill  . Acetaminophen (TYLENOL EX ST ARTHRITIS PAIN PO) Take by mouth.      Marland Kitchen acetaminophen (TYLENOL) 500 MG tablet Take by mouth.    Marland Kitchen amLODipine (NORVASC) 10 MG tablet Take 10 mg by mouth daily.      Marland Kitchen aspirin EC 81 MG tablet Take 81 mg by mouth.    Marland Kitchen atorvastatin (LIPITOR) 80 MG tablet Take 80 mg by mouth.    Marland Kitchen b complex vitamins capsule Take by mouth.    Marland Kitchen buPROPion (WELLBUTRIN SR) 100 MG 12 hr tablet TAKE 1 TABLET (100 MG TOTAL) BY MOUTH DAILY.  4  . carvedilol (COREG) 3.125 MG tablet Take 3.125 mg by mouth.    . cetirizine (ZYRTEC) 10 MG tablet 1 (ONE) TABLET BY MOUTH AT BEDTIME  12  . hydrochlorothiazide (HYDRODIURIL) 25 MG tablet Take 25 mg by mouth daily.    Marland Kitchen letrozole (FEMARA) 2.5 MG tablet 1 TABLET ONCE DAILY - TAKE DAILY FOR HORMONAL PROTECTION AGAINST FUTURE BREAST CANCER  3  . levothyroxine (SYNTHROID) 25 MCG tablet Take by mouth.    . losartan (COZAAR) 50 MG tablet TAKE 1 TABLET BY MOUTH AT BEDTIME (STOP LISINOPRIL)  11  . metoprolol (LOPRESSOR) 50 MG tablet Take 50 mg by mouth 2 (two) times daily.    . Multiple Vitamin (MULTIVITAMIN) tablet Take 1 tablet by mouth daily.    . Nebivolol HCl (BYSTOLIC) 20 MG  TABS Take by mouth.      . Omega-3 Fatty Acids (FISH OIL CONCENTRATE) 300 MG CAPS Take by mouth.    Marland Kitchen omeprazole (PRILOSEC) 40 MG capsule Take 40 mg by mouth daily.      Marland Kitchen oxycodone (OXY-IR) 5 MG capsule TAKE ONE TO TWO CAPSULES BY MOUTH EVERY 4 HOURS AS NEEDED FOR PAIN  0  . pregabalin (LYRICA) 75 MG capsule Take 75 mg by mouth 2 (two) times daily.      . rosuvastatin (CRESTOR) 10 MG tablet Take 10 mg by mouth daily.      Marland Kitchen spironolactone (ALDACTONE) 100 MG tablet Take 100 mg by mouth.    . warfarin (COUMADIN) 2 MG tablet Take 2 mg by mouth daily.       No current facility-administered medications on file prior to visit.    Allergies  Allergen Reactions  . Codeine Nausea And Vomiting  . Other   . Sulfa Antibiotics Nausea And Vomiting  . Tape     No results found for this or any previous visit (from the past 2160 hour(s)).  Objective: General: Patient is awake, alert, and oriented x 3 and in no acute distress.  Integument: Skin is warm, dry and supple bilateral. Nails are tender, long, thickened and dystrophic with subungual debris,  consistent with onychomycosis, 1-5 bilateral. No signs of infection. No open lesions or preulcerative lesions present bilateral. Remaining integument unremarkable.  Vasculature:  Dorsalis Pedis pulse 1/4 bilateral. Posterior Tibial pulse  1/4 bilateral.  Capillary fill time <3 sec 1-5 bilateral. Scant hair growth to the level of the digits. Temperature gradient within normal limits. Trace varicosities present bilateral. No edema present bilateral.   Neurology: The patient has absent sensation measured with a 5.07/10g Semmes Weinstein Monofilament at all pedal sites bilateral . Vibratory sensation absent bilateral with tuning fork. No Babinski sign present bilateral.   Musculoskeletal: Asymptomatic hammertoes pedal deformities noted bilateral. Muscular strength 5/5 in all lower extremity muscular groups bilateral without pain on range of motion . No  tenderness with calf compression bilateral.  Assessment and Plan: Problem List Items Addressed This Visit    None    Visit Diagnoses    Pain due to onychomycosis of toenail of right foot    -  Primary   Type 2 diabetes, controlled, with neuropathy ([place])          -Examined patient. -Discussed and educated patient on diabetic foot care, especially with  regards to the vascular, neurological and musculoskeletal systems.  -Stressed the importance of good glycemic control and the detriment of not  controlling glucose levels in relation to the foot. -Mechanically debrided all nails 1-5 bilateral using sterile nail nipper and filed with dremel without incident -Gave toe spacers and instructed on use  -Answered all patient questions -Patient to return  in 3 months for at risk foot care -Patient advised to call the office if any problems or questions arise in the meantime.  Landis Martins, DPM

## 2016-11-02 DIAGNOSIS — D689 Coagulation defect, unspecified: Secondary | ICD-10-CM | POA: Diagnosis not present

## 2016-11-02 DIAGNOSIS — N186 End stage renal disease: Secondary | ICD-10-CM | POA: Diagnosis not present

## 2016-11-02 DIAGNOSIS — N2581 Secondary hyperparathyroidism of renal origin: Secondary | ICD-10-CM | POA: Diagnosis not present

## 2016-11-02 DIAGNOSIS — D631 Anemia in chronic kidney disease: Secondary | ICD-10-CM | POA: Diagnosis not present

## 2016-11-02 DIAGNOSIS — Z79899 Other long term (current) drug therapy: Secondary | ICD-10-CM | POA: Diagnosis not present

## 2016-11-03 DIAGNOSIS — D689 Coagulation defect, unspecified: Secondary | ICD-10-CM | POA: Diagnosis not present

## 2016-11-03 DIAGNOSIS — N2581 Secondary hyperparathyroidism of renal origin: Secondary | ICD-10-CM | POA: Diagnosis not present

## 2016-11-03 DIAGNOSIS — D631 Anemia in chronic kidney disease: Secondary | ICD-10-CM | POA: Diagnosis not present

## 2016-11-03 DIAGNOSIS — N186 End stage renal disease: Secondary | ICD-10-CM | POA: Diagnosis not present

## 2016-11-03 DIAGNOSIS — Z79899 Other long term (current) drug therapy: Secondary | ICD-10-CM | POA: Diagnosis not present

## 2016-11-05 DIAGNOSIS — D631 Anemia in chronic kidney disease: Secondary | ICD-10-CM | POA: Diagnosis not present

## 2016-11-05 DIAGNOSIS — Z79899 Other long term (current) drug therapy: Secondary | ICD-10-CM | POA: Diagnosis not present

## 2016-11-05 DIAGNOSIS — D689 Coagulation defect, unspecified: Secondary | ICD-10-CM | POA: Diagnosis not present

## 2016-11-05 DIAGNOSIS — N2581 Secondary hyperparathyroidism of renal origin: Secondary | ICD-10-CM | POA: Diagnosis not present

## 2016-11-05 DIAGNOSIS — N186 End stage renal disease: Secondary | ICD-10-CM | POA: Diagnosis not present

## 2016-11-06 DIAGNOSIS — D689 Coagulation defect, unspecified: Secondary | ICD-10-CM | POA: Diagnosis not present

## 2016-11-06 DIAGNOSIS — N186 End stage renal disease: Secondary | ICD-10-CM | POA: Diagnosis not present

## 2016-11-06 DIAGNOSIS — D631 Anemia in chronic kidney disease: Secondary | ICD-10-CM | POA: Diagnosis not present

## 2016-11-06 DIAGNOSIS — N2581 Secondary hyperparathyroidism of renal origin: Secondary | ICD-10-CM | POA: Diagnosis not present

## 2016-11-06 DIAGNOSIS — N059 Unspecified nephritic syndrome with unspecified morphologic changes: Secondary | ICD-10-CM | POA: Diagnosis not present

## 2016-11-06 DIAGNOSIS — Z79899 Other long term (current) drug therapy: Secondary | ICD-10-CM | POA: Diagnosis not present

## 2016-11-06 DIAGNOSIS — Z992 Dependence on renal dialysis: Secondary | ICD-10-CM | POA: Diagnosis not present

## 2016-11-08 DIAGNOSIS — E784 Other hyperlipidemia: Secondary | ICD-10-CM | POA: Diagnosis not present

## 2016-11-08 DIAGNOSIS — D631 Anemia in chronic kidney disease: Secondary | ICD-10-CM | POA: Diagnosis not present

## 2016-11-08 DIAGNOSIS — Z79899 Other long term (current) drug therapy: Secondary | ICD-10-CM | POA: Diagnosis not present

## 2016-11-08 DIAGNOSIS — D509 Iron deficiency anemia, unspecified: Secondary | ICD-10-CM | POA: Diagnosis not present

## 2016-11-08 DIAGNOSIS — N2581 Secondary hyperparathyroidism of renal origin: Secondary | ICD-10-CM | POA: Diagnosis not present

## 2016-11-08 DIAGNOSIS — N186 End stage renal disease: Secondary | ICD-10-CM | POA: Diagnosis not present

## 2016-11-08 DIAGNOSIS — D689 Coagulation defect, unspecified: Secondary | ICD-10-CM | POA: Diagnosis not present

## 2016-11-09 DIAGNOSIS — N2581 Secondary hyperparathyroidism of renal origin: Secondary | ICD-10-CM | POA: Diagnosis not present

## 2016-11-09 DIAGNOSIS — Z79899 Other long term (current) drug therapy: Secondary | ICD-10-CM | POA: Diagnosis not present

## 2016-11-09 DIAGNOSIS — N186 End stage renal disease: Secondary | ICD-10-CM | POA: Diagnosis not present

## 2016-11-09 DIAGNOSIS — D631 Anemia in chronic kidney disease: Secondary | ICD-10-CM | POA: Diagnosis not present

## 2016-11-09 DIAGNOSIS — D689 Coagulation defect, unspecified: Secondary | ICD-10-CM | POA: Diagnosis not present

## 2016-11-09 DIAGNOSIS — D509 Iron deficiency anemia, unspecified: Secondary | ICD-10-CM | POA: Diagnosis not present

## 2016-11-11 DIAGNOSIS — D631 Anemia in chronic kidney disease: Secondary | ICD-10-CM | POA: Diagnosis not present

## 2016-11-11 DIAGNOSIS — N186 End stage renal disease: Secondary | ICD-10-CM | POA: Diagnosis not present

## 2016-11-11 DIAGNOSIS — D509 Iron deficiency anemia, unspecified: Secondary | ICD-10-CM | POA: Diagnosis not present

## 2016-11-11 DIAGNOSIS — N2581 Secondary hyperparathyroidism of renal origin: Secondary | ICD-10-CM | POA: Diagnosis not present

## 2016-11-11 DIAGNOSIS — Z79899 Other long term (current) drug therapy: Secondary | ICD-10-CM | POA: Diagnosis not present

## 2016-11-11 DIAGNOSIS — D689 Coagulation defect, unspecified: Secondary | ICD-10-CM | POA: Diagnosis not present

## 2016-11-12 DIAGNOSIS — N186 End stage renal disease: Secondary | ICD-10-CM | POA: Diagnosis not present

## 2016-11-12 DIAGNOSIS — D509 Iron deficiency anemia, unspecified: Secondary | ICD-10-CM | POA: Diagnosis not present

## 2016-11-12 DIAGNOSIS — D689 Coagulation defect, unspecified: Secondary | ICD-10-CM | POA: Diagnosis not present

## 2016-11-12 DIAGNOSIS — D631 Anemia in chronic kidney disease: Secondary | ICD-10-CM | POA: Diagnosis not present

## 2016-11-12 DIAGNOSIS — N2581 Secondary hyperparathyroidism of renal origin: Secondary | ICD-10-CM | POA: Diagnosis not present

## 2016-11-12 DIAGNOSIS — Z79899 Other long term (current) drug therapy: Secondary | ICD-10-CM | POA: Diagnosis not present

## 2016-11-14 DIAGNOSIS — D689 Coagulation defect, unspecified: Secondary | ICD-10-CM | POA: Diagnosis not present

## 2016-11-14 DIAGNOSIS — N2581 Secondary hyperparathyroidism of renal origin: Secondary | ICD-10-CM | POA: Diagnosis not present

## 2016-11-14 DIAGNOSIS — N186 End stage renal disease: Secondary | ICD-10-CM | POA: Diagnosis not present

## 2016-11-14 DIAGNOSIS — D509 Iron deficiency anemia, unspecified: Secondary | ICD-10-CM | POA: Diagnosis not present

## 2016-11-14 DIAGNOSIS — D631 Anemia in chronic kidney disease: Secondary | ICD-10-CM | POA: Diagnosis not present

## 2016-11-14 DIAGNOSIS — Z79899 Other long term (current) drug therapy: Secondary | ICD-10-CM | POA: Diagnosis not present

## 2016-11-15 DIAGNOSIS — D689 Coagulation defect, unspecified: Secondary | ICD-10-CM | POA: Diagnosis not present

## 2016-11-15 DIAGNOSIS — N186 End stage renal disease: Secondary | ICD-10-CM | POA: Diagnosis not present

## 2016-11-15 DIAGNOSIS — Z79899 Other long term (current) drug therapy: Secondary | ICD-10-CM | POA: Diagnosis not present

## 2016-11-15 DIAGNOSIS — D509 Iron deficiency anemia, unspecified: Secondary | ICD-10-CM | POA: Diagnosis not present

## 2016-11-15 DIAGNOSIS — D631 Anemia in chronic kidney disease: Secondary | ICD-10-CM | POA: Diagnosis not present

## 2016-11-15 DIAGNOSIS — N2581 Secondary hyperparathyroidism of renal origin: Secondary | ICD-10-CM | POA: Diagnosis not present

## 2016-11-16 DIAGNOSIS — D689 Coagulation defect, unspecified: Secondary | ICD-10-CM | POA: Diagnosis not present

## 2016-11-16 DIAGNOSIS — N186 End stage renal disease: Secondary | ICD-10-CM | POA: Diagnosis not present

## 2016-11-16 DIAGNOSIS — N2581 Secondary hyperparathyroidism of renal origin: Secondary | ICD-10-CM | POA: Diagnosis not present

## 2016-11-16 DIAGNOSIS — Z79899 Other long term (current) drug therapy: Secondary | ICD-10-CM | POA: Diagnosis not present

## 2016-11-16 DIAGNOSIS — D509 Iron deficiency anemia, unspecified: Secondary | ICD-10-CM | POA: Diagnosis not present

## 2016-11-16 DIAGNOSIS — D631 Anemia in chronic kidney disease: Secondary | ICD-10-CM | POA: Diagnosis not present

## 2016-11-17 DIAGNOSIS — D689 Coagulation defect, unspecified: Secondary | ICD-10-CM | POA: Diagnosis not present

## 2016-11-17 DIAGNOSIS — Z79899 Other long term (current) drug therapy: Secondary | ICD-10-CM | POA: Diagnosis not present

## 2016-11-17 DIAGNOSIS — N2581 Secondary hyperparathyroidism of renal origin: Secondary | ICD-10-CM | POA: Diagnosis not present

## 2016-11-17 DIAGNOSIS — N186 End stage renal disease: Secondary | ICD-10-CM | POA: Diagnosis not present

## 2016-11-17 DIAGNOSIS — D631 Anemia in chronic kidney disease: Secondary | ICD-10-CM | POA: Diagnosis not present

## 2016-11-17 DIAGNOSIS — D509 Iron deficiency anemia, unspecified: Secondary | ICD-10-CM | POA: Diagnosis not present

## 2016-11-18 DIAGNOSIS — D689 Coagulation defect, unspecified: Secondary | ICD-10-CM | POA: Diagnosis not present

## 2016-11-18 DIAGNOSIS — D509 Iron deficiency anemia, unspecified: Secondary | ICD-10-CM | POA: Diagnosis not present

## 2016-11-18 DIAGNOSIS — N2581 Secondary hyperparathyroidism of renal origin: Secondary | ICD-10-CM | POA: Diagnosis not present

## 2016-11-18 DIAGNOSIS — Z79899 Other long term (current) drug therapy: Secondary | ICD-10-CM | POA: Diagnosis not present

## 2016-11-18 DIAGNOSIS — D631 Anemia in chronic kidney disease: Secondary | ICD-10-CM | POA: Diagnosis not present

## 2016-11-18 DIAGNOSIS — N186 End stage renal disease: Secondary | ICD-10-CM | POA: Diagnosis not present

## 2016-11-19 DIAGNOSIS — N186 End stage renal disease: Secondary | ICD-10-CM | POA: Diagnosis not present

## 2016-11-19 DIAGNOSIS — N2581 Secondary hyperparathyroidism of renal origin: Secondary | ICD-10-CM | POA: Diagnosis not present

## 2016-11-19 DIAGNOSIS — D509 Iron deficiency anemia, unspecified: Secondary | ICD-10-CM | POA: Diagnosis not present

## 2016-11-19 DIAGNOSIS — Z79899 Other long term (current) drug therapy: Secondary | ICD-10-CM | POA: Diagnosis not present

## 2016-11-19 DIAGNOSIS — D689 Coagulation defect, unspecified: Secondary | ICD-10-CM | POA: Diagnosis not present

## 2016-11-19 DIAGNOSIS — D631 Anemia in chronic kidney disease: Secondary | ICD-10-CM | POA: Diagnosis not present

## 2016-11-20 DIAGNOSIS — D689 Coagulation defect, unspecified: Secondary | ICD-10-CM | POA: Diagnosis not present

## 2016-11-20 DIAGNOSIS — Z79899 Other long term (current) drug therapy: Secondary | ICD-10-CM | POA: Diagnosis not present

## 2016-11-20 DIAGNOSIS — N2581 Secondary hyperparathyroidism of renal origin: Secondary | ICD-10-CM | POA: Diagnosis not present

## 2016-11-20 DIAGNOSIS — D509 Iron deficiency anemia, unspecified: Secondary | ICD-10-CM | POA: Diagnosis not present

## 2016-11-20 DIAGNOSIS — N186 End stage renal disease: Secondary | ICD-10-CM | POA: Diagnosis not present

## 2016-11-20 DIAGNOSIS — D631 Anemia in chronic kidney disease: Secondary | ICD-10-CM | POA: Diagnosis not present

## 2016-11-21 DIAGNOSIS — N186 End stage renal disease: Secondary | ICD-10-CM | POA: Diagnosis not present

## 2016-11-21 DIAGNOSIS — N2581 Secondary hyperparathyroidism of renal origin: Secondary | ICD-10-CM | POA: Diagnosis not present

## 2016-11-21 DIAGNOSIS — Z79899 Other long term (current) drug therapy: Secondary | ICD-10-CM | POA: Diagnosis not present

## 2016-11-21 DIAGNOSIS — D631 Anemia in chronic kidney disease: Secondary | ICD-10-CM | POA: Diagnosis not present

## 2016-11-21 DIAGNOSIS — D689 Coagulation defect, unspecified: Secondary | ICD-10-CM | POA: Diagnosis not present

## 2016-11-21 DIAGNOSIS — D509 Iron deficiency anemia, unspecified: Secondary | ICD-10-CM | POA: Diagnosis not present

## 2016-11-23 DIAGNOSIS — Z79899 Other long term (current) drug therapy: Secondary | ICD-10-CM | POA: Diagnosis not present

## 2016-11-23 DIAGNOSIS — D509 Iron deficiency anemia, unspecified: Secondary | ICD-10-CM | POA: Diagnosis not present

## 2016-11-23 DIAGNOSIS — D689 Coagulation defect, unspecified: Secondary | ICD-10-CM | POA: Diagnosis not present

## 2016-11-23 DIAGNOSIS — D631 Anemia in chronic kidney disease: Secondary | ICD-10-CM | POA: Diagnosis not present

## 2016-11-23 DIAGNOSIS — N2581 Secondary hyperparathyroidism of renal origin: Secondary | ICD-10-CM | POA: Diagnosis not present

## 2016-11-23 DIAGNOSIS — N186 End stage renal disease: Secondary | ICD-10-CM | POA: Diagnosis not present

## 2016-11-24 DIAGNOSIS — D509 Iron deficiency anemia, unspecified: Secondary | ICD-10-CM | POA: Diagnosis not present

## 2016-11-24 DIAGNOSIS — D689 Coagulation defect, unspecified: Secondary | ICD-10-CM | POA: Diagnosis not present

## 2016-11-24 DIAGNOSIS — N186 End stage renal disease: Secondary | ICD-10-CM | POA: Diagnosis not present

## 2016-11-24 DIAGNOSIS — D631 Anemia in chronic kidney disease: Secondary | ICD-10-CM | POA: Diagnosis not present

## 2016-11-24 DIAGNOSIS — N2581 Secondary hyperparathyroidism of renal origin: Secondary | ICD-10-CM | POA: Diagnosis not present

## 2016-11-24 DIAGNOSIS — Z79899 Other long term (current) drug therapy: Secondary | ICD-10-CM | POA: Diagnosis not present

## 2016-11-27 DIAGNOSIS — D631 Anemia in chronic kidney disease: Secondary | ICD-10-CM | POA: Diagnosis not present

## 2016-11-27 DIAGNOSIS — N186 End stage renal disease: Secondary | ICD-10-CM | POA: Diagnosis not present

## 2016-11-27 DIAGNOSIS — D689 Coagulation defect, unspecified: Secondary | ICD-10-CM | POA: Diagnosis not present

## 2016-11-27 DIAGNOSIS — N2581 Secondary hyperparathyroidism of renal origin: Secondary | ICD-10-CM | POA: Diagnosis not present

## 2016-11-27 DIAGNOSIS — D509 Iron deficiency anemia, unspecified: Secondary | ICD-10-CM | POA: Diagnosis not present

## 2016-11-27 DIAGNOSIS — Z79899 Other long term (current) drug therapy: Secondary | ICD-10-CM | POA: Diagnosis not present

## 2016-11-29 DIAGNOSIS — Z79899 Other long term (current) drug therapy: Secondary | ICD-10-CM | POA: Diagnosis not present

## 2016-11-29 DIAGNOSIS — D689 Coagulation defect, unspecified: Secondary | ICD-10-CM | POA: Diagnosis not present

## 2016-11-29 DIAGNOSIS — D631 Anemia in chronic kidney disease: Secondary | ICD-10-CM | POA: Diagnosis not present

## 2016-11-29 DIAGNOSIS — N186 End stage renal disease: Secondary | ICD-10-CM | POA: Diagnosis not present

## 2016-11-29 DIAGNOSIS — D509 Iron deficiency anemia, unspecified: Secondary | ICD-10-CM | POA: Diagnosis not present

## 2016-11-29 DIAGNOSIS — N2581 Secondary hyperparathyroidism of renal origin: Secondary | ICD-10-CM | POA: Diagnosis not present

## 2016-11-30 DIAGNOSIS — D689 Coagulation defect, unspecified: Secondary | ICD-10-CM | POA: Diagnosis not present

## 2016-11-30 DIAGNOSIS — N186 End stage renal disease: Secondary | ICD-10-CM | POA: Diagnosis not present

## 2016-11-30 DIAGNOSIS — D631 Anemia in chronic kidney disease: Secondary | ICD-10-CM | POA: Diagnosis not present

## 2016-11-30 DIAGNOSIS — Z79899 Other long term (current) drug therapy: Secondary | ICD-10-CM | POA: Diagnosis not present

## 2016-11-30 DIAGNOSIS — D509 Iron deficiency anemia, unspecified: Secondary | ICD-10-CM | POA: Diagnosis not present

## 2016-11-30 DIAGNOSIS — N2581 Secondary hyperparathyroidism of renal origin: Secondary | ICD-10-CM | POA: Diagnosis not present

## 2016-12-03 DIAGNOSIS — Z79899 Other long term (current) drug therapy: Secondary | ICD-10-CM | POA: Diagnosis not present

## 2016-12-03 DIAGNOSIS — D509 Iron deficiency anemia, unspecified: Secondary | ICD-10-CM | POA: Diagnosis not present

## 2016-12-03 DIAGNOSIS — D631 Anemia in chronic kidney disease: Secondary | ICD-10-CM | POA: Diagnosis not present

## 2016-12-03 DIAGNOSIS — D689 Coagulation defect, unspecified: Secondary | ICD-10-CM | POA: Diagnosis not present

## 2016-12-03 DIAGNOSIS — N186 End stage renal disease: Secondary | ICD-10-CM | POA: Diagnosis not present

## 2016-12-03 DIAGNOSIS — N2581 Secondary hyperparathyroidism of renal origin: Secondary | ICD-10-CM | POA: Diagnosis not present

## 2016-12-04 DIAGNOSIS — N186 End stage renal disease: Secondary | ICD-10-CM | POA: Diagnosis not present

## 2016-12-04 DIAGNOSIS — D689 Coagulation defect, unspecified: Secondary | ICD-10-CM | POA: Diagnosis not present

## 2016-12-04 DIAGNOSIS — Z79899 Other long term (current) drug therapy: Secondary | ICD-10-CM | POA: Diagnosis not present

## 2016-12-04 DIAGNOSIS — D509 Iron deficiency anemia, unspecified: Secondary | ICD-10-CM | POA: Diagnosis not present

## 2016-12-04 DIAGNOSIS — D631 Anemia in chronic kidney disease: Secondary | ICD-10-CM | POA: Diagnosis not present

## 2016-12-04 DIAGNOSIS — N2581 Secondary hyperparathyroidism of renal origin: Secondary | ICD-10-CM | POA: Diagnosis not present

## 2016-12-05 DIAGNOSIS — D631 Anemia in chronic kidney disease: Secondary | ICD-10-CM | POA: Diagnosis not present

## 2016-12-05 DIAGNOSIS — D689 Coagulation defect, unspecified: Secondary | ICD-10-CM | POA: Diagnosis not present

## 2016-12-05 DIAGNOSIS — D509 Iron deficiency anemia, unspecified: Secondary | ICD-10-CM | POA: Diagnosis not present

## 2016-12-05 DIAGNOSIS — Z79899 Other long term (current) drug therapy: Secondary | ICD-10-CM | POA: Diagnosis not present

## 2016-12-05 DIAGNOSIS — N2581 Secondary hyperparathyroidism of renal origin: Secondary | ICD-10-CM | POA: Diagnosis not present

## 2016-12-05 DIAGNOSIS — N186 End stage renal disease: Secondary | ICD-10-CM | POA: Diagnosis not present

## 2016-12-07 DIAGNOSIS — N059 Unspecified nephritic syndrome with unspecified morphologic changes: Secondary | ICD-10-CM | POA: Diagnosis not present

## 2016-12-07 DIAGNOSIS — Z992 Dependence on renal dialysis: Secondary | ICD-10-CM | POA: Diagnosis not present

## 2016-12-07 DIAGNOSIS — N186 End stage renal disease: Secondary | ICD-10-CM | POA: Diagnosis not present

## 2016-12-09 DIAGNOSIS — N2589 Other disorders resulting from impaired renal tubular function: Secondary | ICD-10-CM | POA: Diagnosis not present

## 2016-12-09 DIAGNOSIS — N186 End stage renal disease: Secondary | ICD-10-CM | POA: Diagnosis not present

## 2016-12-09 DIAGNOSIS — D509 Iron deficiency anemia, unspecified: Secondary | ICD-10-CM | POA: Diagnosis not present

## 2016-12-09 DIAGNOSIS — Z79899 Other long term (current) drug therapy: Secondary | ICD-10-CM | POA: Diagnosis not present

## 2016-12-09 DIAGNOSIS — N2581 Secondary hyperparathyroidism of renal origin: Secondary | ICD-10-CM | POA: Diagnosis not present

## 2016-12-11 DIAGNOSIS — Z79899 Other long term (current) drug therapy: Secondary | ICD-10-CM | POA: Diagnosis not present

## 2016-12-11 DIAGNOSIS — D509 Iron deficiency anemia, unspecified: Secondary | ICD-10-CM | POA: Diagnosis not present

## 2016-12-11 DIAGNOSIS — N2581 Secondary hyperparathyroidism of renal origin: Secondary | ICD-10-CM | POA: Diagnosis not present

## 2016-12-11 DIAGNOSIS — N186 End stage renal disease: Secondary | ICD-10-CM | POA: Diagnosis not present

## 2016-12-11 DIAGNOSIS — N2589 Other disorders resulting from impaired renal tubular function: Secondary | ICD-10-CM | POA: Diagnosis not present

## 2016-12-12 DIAGNOSIS — Z79899 Other long term (current) drug therapy: Secondary | ICD-10-CM | POA: Diagnosis not present

## 2016-12-12 DIAGNOSIS — N2581 Secondary hyperparathyroidism of renal origin: Secondary | ICD-10-CM | POA: Diagnosis not present

## 2016-12-12 DIAGNOSIS — N186 End stage renal disease: Secondary | ICD-10-CM | POA: Diagnosis not present

## 2016-12-12 DIAGNOSIS — N2589 Other disorders resulting from impaired renal tubular function: Secondary | ICD-10-CM | POA: Diagnosis not present

## 2016-12-12 DIAGNOSIS — D509 Iron deficiency anemia, unspecified: Secondary | ICD-10-CM | POA: Diagnosis not present

## 2016-12-13 DIAGNOSIS — I82409 Acute embolism and thrombosis of unspecified deep veins of unspecified lower extremity: Secondary | ICD-10-CM | POA: Diagnosis not present

## 2016-12-13 DIAGNOSIS — R197 Diarrhea, unspecified: Secondary | ICD-10-CM | POA: Diagnosis not present

## 2016-12-13 DIAGNOSIS — K219 Gastro-esophageal reflux disease without esophagitis: Secondary | ICD-10-CM | POA: Diagnosis not present

## 2016-12-13 DIAGNOSIS — J449 Chronic obstructive pulmonary disease, unspecified: Secondary | ICD-10-CM | POA: Diagnosis not present

## 2016-12-13 DIAGNOSIS — E039 Hypothyroidism, unspecified: Secondary | ICD-10-CM | POA: Diagnosis not present

## 2016-12-13 DIAGNOSIS — E785 Hyperlipidemia, unspecified: Secondary | ICD-10-CM | POA: Diagnosis not present

## 2016-12-13 DIAGNOSIS — M545 Low back pain: Secondary | ICD-10-CM | POA: Diagnosis not present

## 2016-12-13 DIAGNOSIS — B0229 Other postherpetic nervous system involvement: Secondary | ICD-10-CM | POA: Diagnosis not present

## 2016-12-13 DIAGNOSIS — E1121 Type 2 diabetes mellitus with diabetic nephropathy: Secondary | ICD-10-CM | POA: Diagnosis not present

## 2016-12-13 DIAGNOSIS — R600 Localized edema: Secondary | ICD-10-CM | POA: Diagnosis not present

## 2016-12-13 DIAGNOSIS — M159 Polyosteoarthritis, unspecified: Secondary | ICD-10-CM | POA: Diagnosis not present

## 2016-12-13 DIAGNOSIS — N186 End stage renal disease: Secondary | ICD-10-CM | POA: Diagnosis not present

## 2016-12-13 DIAGNOSIS — J309 Allergic rhinitis, unspecified: Secondary | ICD-10-CM | POA: Diagnosis not present

## 2016-12-14 DIAGNOSIS — Z79899 Other long term (current) drug therapy: Secondary | ICD-10-CM | POA: Diagnosis not present

## 2016-12-14 DIAGNOSIS — N2581 Secondary hyperparathyroidism of renal origin: Secondary | ICD-10-CM | POA: Diagnosis not present

## 2016-12-14 DIAGNOSIS — N2589 Other disorders resulting from impaired renal tubular function: Secondary | ICD-10-CM | POA: Diagnosis not present

## 2016-12-14 DIAGNOSIS — D509 Iron deficiency anemia, unspecified: Secondary | ICD-10-CM | POA: Diagnosis not present

## 2016-12-14 DIAGNOSIS — N186 End stage renal disease: Secondary | ICD-10-CM | POA: Diagnosis not present

## 2016-12-16 DIAGNOSIS — D509 Iron deficiency anemia, unspecified: Secondary | ICD-10-CM | POA: Diagnosis not present

## 2016-12-16 DIAGNOSIS — N2581 Secondary hyperparathyroidism of renal origin: Secondary | ICD-10-CM | POA: Diagnosis not present

## 2016-12-16 DIAGNOSIS — N186 End stage renal disease: Secondary | ICD-10-CM | POA: Diagnosis not present

## 2016-12-16 DIAGNOSIS — Z79899 Other long term (current) drug therapy: Secondary | ICD-10-CM | POA: Diagnosis not present

## 2016-12-16 DIAGNOSIS — N2589 Other disorders resulting from impaired renal tubular function: Secondary | ICD-10-CM | POA: Diagnosis not present

## 2016-12-18 DIAGNOSIS — N2581 Secondary hyperparathyroidism of renal origin: Secondary | ICD-10-CM | POA: Diagnosis not present

## 2016-12-18 DIAGNOSIS — D509 Iron deficiency anemia, unspecified: Secondary | ICD-10-CM | POA: Diagnosis not present

## 2016-12-18 DIAGNOSIS — N186 End stage renal disease: Secondary | ICD-10-CM | POA: Diagnosis not present

## 2016-12-18 DIAGNOSIS — Z79899 Other long term (current) drug therapy: Secondary | ICD-10-CM | POA: Diagnosis not present

## 2016-12-18 DIAGNOSIS — N2589 Other disorders resulting from impaired renal tubular function: Secondary | ICD-10-CM | POA: Diagnosis not present

## 2016-12-19 DIAGNOSIS — Z79899 Other long term (current) drug therapy: Secondary | ICD-10-CM | POA: Diagnosis not present

## 2016-12-19 DIAGNOSIS — N2581 Secondary hyperparathyroidism of renal origin: Secondary | ICD-10-CM | POA: Diagnosis not present

## 2016-12-19 DIAGNOSIS — N2589 Other disorders resulting from impaired renal tubular function: Secondary | ICD-10-CM | POA: Diagnosis not present

## 2016-12-19 DIAGNOSIS — N186 End stage renal disease: Secondary | ICD-10-CM | POA: Diagnosis not present

## 2016-12-19 DIAGNOSIS — D509 Iron deficiency anemia, unspecified: Secondary | ICD-10-CM | POA: Diagnosis not present

## 2016-12-21 DIAGNOSIS — N186 End stage renal disease: Secondary | ICD-10-CM | POA: Diagnosis not present

## 2016-12-21 DIAGNOSIS — N2589 Other disorders resulting from impaired renal tubular function: Secondary | ICD-10-CM | POA: Diagnosis not present

## 2016-12-21 DIAGNOSIS — N2581 Secondary hyperparathyroidism of renal origin: Secondary | ICD-10-CM | POA: Diagnosis not present

## 2016-12-21 DIAGNOSIS — D509 Iron deficiency anemia, unspecified: Secondary | ICD-10-CM | POA: Diagnosis not present

## 2016-12-21 DIAGNOSIS — Z79899 Other long term (current) drug therapy: Secondary | ICD-10-CM | POA: Diagnosis not present

## 2016-12-22 DIAGNOSIS — D509 Iron deficiency anemia, unspecified: Secondary | ICD-10-CM | POA: Diagnosis not present

## 2016-12-22 DIAGNOSIS — Z79899 Other long term (current) drug therapy: Secondary | ICD-10-CM | POA: Diagnosis not present

## 2016-12-22 DIAGNOSIS — Z170 Estrogen receptor positive status [ER+]: Secondary | ICD-10-CM | POA: Diagnosis not present

## 2016-12-22 DIAGNOSIS — N2589 Other disorders resulting from impaired renal tubular function: Secondary | ICD-10-CM | POA: Diagnosis not present

## 2016-12-22 DIAGNOSIS — Z923 Personal history of irradiation: Secondary | ICD-10-CM | POA: Diagnosis not present

## 2016-12-22 DIAGNOSIS — C50919 Malignant neoplasm of unspecified site of unspecified female breast: Secondary | ICD-10-CM | POA: Diagnosis not present

## 2016-12-22 DIAGNOSIS — N2581 Secondary hyperparathyroidism of renal origin: Secondary | ICD-10-CM | POA: Diagnosis not present

## 2016-12-22 DIAGNOSIS — Z79811 Long term (current) use of aromatase inhibitors: Secondary | ICD-10-CM | POA: Diagnosis not present

## 2016-12-22 DIAGNOSIS — N186 End stage renal disease: Secondary | ICD-10-CM | POA: Diagnosis not present

## 2016-12-22 DIAGNOSIS — Z853 Personal history of malignant neoplasm of breast: Secondary | ICD-10-CM | POA: Diagnosis not present

## 2016-12-23 DIAGNOSIS — N2581 Secondary hyperparathyroidism of renal origin: Secondary | ICD-10-CM | POA: Diagnosis not present

## 2016-12-23 DIAGNOSIS — N186 End stage renal disease: Secondary | ICD-10-CM | POA: Diagnosis not present

## 2016-12-23 DIAGNOSIS — D509 Iron deficiency anemia, unspecified: Secondary | ICD-10-CM | POA: Diagnosis not present

## 2016-12-23 DIAGNOSIS — Z79899 Other long term (current) drug therapy: Secondary | ICD-10-CM | POA: Diagnosis not present

## 2016-12-23 DIAGNOSIS — N2589 Other disorders resulting from impaired renal tubular function: Secondary | ICD-10-CM | POA: Diagnosis not present

## 2016-12-25 DIAGNOSIS — N2581 Secondary hyperparathyroidism of renal origin: Secondary | ICD-10-CM | POA: Diagnosis not present

## 2016-12-25 DIAGNOSIS — N186 End stage renal disease: Secondary | ICD-10-CM | POA: Diagnosis not present

## 2016-12-25 DIAGNOSIS — N2589 Other disorders resulting from impaired renal tubular function: Secondary | ICD-10-CM | POA: Diagnosis not present

## 2016-12-25 DIAGNOSIS — D509 Iron deficiency anemia, unspecified: Secondary | ICD-10-CM | POA: Diagnosis not present

## 2016-12-25 DIAGNOSIS — Z79899 Other long term (current) drug therapy: Secondary | ICD-10-CM | POA: Diagnosis not present

## 2016-12-26 DIAGNOSIS — N2589 Other disorders resulting from impaired renal tubular function: Secondary | ICD-10-CM | POA: Diagnosis not present

## 2016-12-26 DIAGNOSIS — N2581 Secondary hyperparathyroidism of renal origin: Secondary | ICD-10-CM | POA: Diagnosis not present

## 2016-12-26 DIAGNOSIS — N186 End stage renal disease: Secondary | ICD-10-CM | POA: Diagnosis not present

## 2016-12-26 DIAGNOSIS — D509 Iron deficiency anemia, unspecified: Secondary | ICD-10-CM | POA: Diagnosis not present

## 2016-12-26 DIAGNOSIS — Z79899 Other long term (current) drug therapy: Secondary | ICD-10-CM | POA: Diagnosis not present

## 2016-12-28 DIAGNOSIS — D509 Iron deficiency anemia, unspecified: Secondary | ICD-10-CM | POA: Diagnosis not present

## 2016-12-28 DIAGNOSIS — N186 End stage renal disease: Secondary | ICD-10-CM | POA: Diagnosis not present

## 2016-12-28 DIAGNOSIS — Z79899 Other long term (current) drug therapy: Secondary | ICD-10-CM | POA: Diagnosis not present

## 2016-12-28 DIAGNOSIS — N2581 Secondary hyperparathyroidism of renal origin: Secondary | ICD-10-CM | POA: Diagnosis not present

## 2016-12-28 DIAGNOSIS — N2589 Other disorders resulting from impaired renal tubular function: Secondary | ICD-10-CM | POA: Diagnosis not present

## 2016-12-30 DIAGNOSIS — N2589 Other disorders resulting from impaired renal tubular function: Secondary | ICD-10-CM | POA: Diagnosis not present

## 2016-12-30 DIAGNOSIS — D509 Iron deficiency anemia, unspecified: Secondary | ICD-10-CM | POA: Diagnosis not present

## 2016-12-30 DIAGNOSIS — N2581 Secondary hyperparathyroidism of renal origin: Secondary | ICD-10-CM | POA: Diagnosis not present

## 2016-12-30 DIAGNOSIS — Z79899 Other long term (current) drug therapy: Secondary | ICD-10-CM | POA: Diagnosis not present

## 2016-12-30 DIAGNOSIS — N186 End stage renal disease: Secondary | ICD-10-CM | POA: Diagnosis not present

## 2017-01-01 DIAGNOSIS — Z79899 Other long term (current) drug therapy: Secondary | ICD-10-CM | POA: Diagnosis not present

## 2017-01-01 DIAGNOSIS — N186 End stage renal disease: Secondary | ICD-10-CM | POA: Diagnosis not present

## 2017-01-01 DIAGNOSIS — N2589 Other disorders resulting from impaired renal tubular function: Secondary | ICD-10-CM | POA: Diagnosis not present

## 2017-01-01 DIAGNOSIS — D509 Iron deficiency anemia, unspecified: Secondary | ICD-10-CM | POA: Diagnosis not present

## 2017-01-01 DIAGNOSIS — N2581 Secondary hyperparathyroidism of renal origin: Secondary | ICD-10-CM | POA: Diagnosis not present

## 2017-01-02 DIAGNOSIS — N186 End stage renal disease: Secondary | ICD-10-CM | POA: Diagnosis not present

## 2017-01-02 DIAGNOSIS — D509 Iron deficiency anemia, unspecified: Secondary | ICD-10-CM | POA: Diagnosis not present

## 2017-01-02 DIAGNOSIS — N2581 Secondary hyperparathyroidism of renal origin: Secondary | ICD-10-CM | POA: Diagnosis not present

## 2017-01-02 DIAGNOSIS — Z79899 Other long term (current) drug therapy: Secondary | ICD-10-CM | POA: Diagnosis not present

## 2017-01-02 DIAGNOSIS — N2589 Other disorders resulting from impaired renal tubular function: Secondary | ICD-10-CM | POA: Diagnosis not present

## 2017-01-04 DIAGNOSIS — N186 End stage renal disease: Secondary | ICD-10-CM | POA: Diagnosis not present

## 2017-01-04 DIAGNOSIS — N2589 Other disorders resulting from impaired renal tubular function: Secondary | ICD-10-CM | POA: Diagnosis not present

## 2017-01-04 DIAGNOSIS — Z79899 Other long term (current) drug therapy: Secondary | ICD-10-CM | POA: Diagnosis not present

## 2017-01-04 DIAGNOSIS — D509 Iron deficiency anemia, unspecified: Secondary | ICD-10-CM | POA: Diagnosis not present

## 2017-01-04 DIAGNOSIS — N2581 Secondary hyperparathyroidism of renal origin: Secondary | ICD-10-CM | POA: Diagnosis not present

## 2017-01-06 DIAGNOSIS — N186 End stage renal disease: Secondary | ICD-10-CM | POA: Diagnosis not present

## 2017-01-06 DIAGNOSIS — N2581 Secondary hyperparathyroidism of renal origin: Secondary | ICD-10-CM | POA: Diagnosis not present

## 2017-01-06 DIAGNOSIS — D509 Iron deficiency anemia, unspecified: Secondary | ICD-10-CM | POA: Diagnosis not present

## 2017-01-06 DIAGNOSIS — N059 Unspecified nephritic syndrome with unspecified morphologic changes: Secondary | ICD-10-CM | POA: Diagnosis not present

## 2017-01-06 DIAGNOSIS — N2589 Other disorders resulting from impaired renal tubular function: Secondary | ICD-10-CM | POA: Diagnosis not present

## 2017-01-06 DIAGNOSIS — Z992 Dependence on renal dialysis: Secondary | ICD-10-CM | POA: Diagnosis not present

## 2017-01-06 DIAGNOSIS — Z79899 Other long term (current) drug therapy: Secondary | ICD-10-CM | POA: Diagnosis not present

## 2017-01-07 DIAGNOSIS — N186 End stage renal disease: Secondary | ICD-10-CM | POA: Diagnosis not present

## 2017-01-07 DIAGNOSIS — N2581 Secondary hyperparathyroidism of renal origin: Secondary | ICD-10-CM | POA: Diagnosis not present

## 2017-01-07 DIAGNOSIS — Z79899 Other long term (current) drug therapy: Secondary | ICD-10-CM | POA: Diagnosis not present

## 2017-01-07 DIAGNOSIS — D631 Anemia in chronic kidney disease: Secondary | ICD-10-CM | POA: Diagnosis not present

## 2017-01-07 DIAGNOSIS — R5381 Other malaise: Secondary | ICD-10-CM | POA: Diagnosis not present

## 2017-01-07 DIAGNOSIS — D509 Iron deficiency anemia, unspecified: Secondary | ICD-10-CM | POA: Diagnosis not present

## 2017-01-08 DIAGNOSIS — E784 Other hyperlipidemia: Secondary | ICD-10-CM | POA: Diagnosis not present

## 2017-01-08 DIAGNOSIS — Z79899 Other long term (current) drug therapy: Secondary | ICD-10-CM | POA: Diagnosis not present

## 2017-01-08 DIAGNOSIS — R5381 Other malaise: Secondary | ICD-10-CM | POA: Diagnosis not present

## 2017-01-08 DIAGNOSIS — D631 Anemia in chronic kidney disease: Secondary | ICD-10-CM | POA: Diagnosis not present

## 2017-01-08 DIAGNOSIS — D509 Iron deficiency anemia, unspecified: Secondary | ICD-10-CM | POA: Diagnosis not present

## 2017-01-08 DIAGNOSIS — N186 End stage renal disease: Secondary | ICD-10-CM | POA: Diagnosis not present

## 2017-01-10 DIAGNOSIS — N186 End stage renal disease: Secondary | ICD-10-CM | POA: Diagnosis not present

## 2017-01-10 DIAGNOSIS — Z79899 Other long term (current) drug therapy: Secondary | ICD-10-CM | POA: Diagnosis not present

## 2017-01-10 DIAGNOSIS — D631 Anemia in chronic kidney disease: Secondary | ICD-10-CM | POA: Diagnosis not present

## 2017-01-10 DIAGNOSIS — D509 Iron deficiency anemia, unspecified: Secondary | ICD-10-CM | POA: Diagnosis not present

## 2017-01-10 DIAGNOSIS — R5381 Other malaise: Secondary | ICD-10-CM | POA: Diagnosis not present

## 2017-01-11 DIAGNOSIS — Z79899 Other long term (current) drug therapy: Secondary | ICD-10-CM | POA: Diagnosis not present

## 2017-01-11 DIAGNOSIS — N186 End stage renal disease: Secondary | ICD-10-CM | POA: Diagnosis not present

## 2017-01-11 DIAGNOSIS — D509 Iron deficiency anemia, unspecified: Secondary | ICD-10-CM | POA: Diagnosis not present

## 2017-01-11 DIAGNOSIS — D631 Anemia in chronic kidney disease: Secondary | ICD-10-CM | POA: Diagnosis not present

## 2017-01-11 DIAGNOSIS — R5381 Other malaise: Secondary | ICD-10-CM | POA: Diagnosis not present

## 2017-01-12 DIAGNOSIS — B0229 Other postherpetic nervous system involvement: Secondary | ICD-10-CM | POA: Diagnosis not present

## 2017-01-12 DIAGNOSIS — Z79899 Other long term (current) drug therapy: Secondary | ICD-10-CM | POA: Diagnosis not present

## 2017-01-12 DIAGNOSIS — R197 Diarrhea, unspecified: Secondary | ICD-10-CM | POA: Diagnosis not present

## 2017-01-12 DIAGNOSIS — M545 Low back pain: Secondary | ICD-10-CM | POA: Diagnosis not present

## 2017-01-12 DIAGNOSIS — N186 End stage renal disease: Secondary | ICD-10-CM | POA: Diagnosis not present

## 2017-01-12 DIAGNOSIS — J309 Allergic rhinitis, unspecified: Secondary | ICD-10-CM | POA: Diagnosis not present

## 2017-01-12 DIAGNOSIS — Z6820 Body mass index (BMI) 20.0-20.9, adult: Secondary | ICD-10-CM | POA: Diagnosis not present

## 2017-01-12 DIAGNOSIS — K219 Gastro-esophageal reflux disease without esophagitis: Secondary | ICD-10-CM | POA: Diagnosis not present

## 2017-01-12 DIAGNOSIS — D509 Iron deficiency anemia, unspecified: Secondary | ICD-10-CM | POA: Diagnosis not present

## 2017-01-12 DIAGNOSIS — R600 Localized edema: Secondary | ICD-10-CM | POA: Diagnosis not present

## 2017-01-12 DIAGNOSIS — D631 Anemia in chronic kidney disease: Secondary | ICD-10-CM | POA: Diagnosis not present

## 2017-01-12 DIAGNOSIS — I82409 Acute embolism and thrombosis of unspecified deep veins of unspecified lower extremity: Secondary | ICD-10-CM | POA: Diagnosis not present

## 2017-01-12 DIAGNOSIS — R5381 Other malaise: Secondary | ICD-10-CM | POA: Diagnosis not present

## 2017-01-12 DIAGNOSIS — E785 Hyperlipidemia, unspecified: Secondary | ICD-10-CM | POA: Diagnosis not present

## 2017-01-13 DIAGNOSIS — D631 Anemia in chronic kidney disease: Secondary | ICD-10-CM | POA: Diagnosis not present

## 2017-01-13 DIAGNOSIS — Z79899 Other long term (current) drug therapy: Secondary | ICD-10-CM | POA: Diagnosis not present

## 2017-01-13 DIAGNOSIS — D509 Iron deficiency anemia, unspecified: Secondary | ICD-10-CM | POA: Diagnosis not present

## 2017-01-13 DIAGNOSIS — N186 End stage renal disease: Secondary | ICD-10-CM | POA: Diagnosis not present

## 2017-01-13 DIAGNOSIS — R5381 Other malaise: Secondary | ICD-10-CM | POA: Diagnosis not present

## 2017-01-14 DIAGNOSIS — D509 Iron deficiency anemia, unspecified: Secondary | ICD-10-CM | POA: Diagnosis not present

## 2017-01-14 DIAGNOSIS — Z79899 Other long term (current) drug therapy: Secondary | ICD-10-CM | POA: Diagnosis not present

## 2017-01-14 DIAGNOSIS — R5381 Other malaise: Secondary | ICD-10-CM | POA: Diagnosis not present

## 2017-01-14 DIAGNOSIS — D631 Anemia in chronic kidney disease: Secondary | ICD-10-CM | POA: Diagnosis not present

## 2017-01-14 DIAGNOSIS — N186 End stage renal disease: Secondary | ICD-10-CM | POA: Diagnosis not present

## 2017-01-15 DIAGNOSIS — D631 Anemia in chronic kidney disease: Secondary | ICD-10-CM | POA: Diagnosis not present

## 2017-01-15 DIAGNOSIS — D509 Iron deficiency anemia, unspecified: Secondary | ICD-10-CM | POA: Diagnosis not present

## 2017-01-15 DIAGNOSIS — Z79899 Other long term (current) drug therapy: Secondary | ICD-10-CM | POA: Diagnosis not present

## 2017-01-15 DIAGNOSIS — N186 End stage renal disease: Secondary | ICD-10-CM | POA: Diagnosis not present

## 2017-01-15 DIAGNOSIS — R5381 Other malaise: Secondary | ICD-10-CM | POA: Diagnosis not present

## 2017-01-16 DIAGNOSIS — R5381 Other malaise: Secondary | ICD-10-CM | POA: Diagnosis not present

## 2017-01-16 DIAGNOSIS — Z79899 Other long term (current) drug therapy: Secondary | ICD-10-CM | POA: Diagnosis not present

## 2017-01-16 DIAGNOSIS — D509 Iron deficiency anemia, unspecified: Secondary | ICD-10-CM | POA: Diagnosis not present

## 2017-01-16 DIAGNOSIS — D631 Anemia in chronic kidney disease: Secondary | ICD-10-CM | POA: Diagnosis not present

## 2017-01-16 DIAGNOSIS — N186 End stage renal disease: Secondary | ICD-10-CM | POA: Diagnosis not present

## 2017-01-17 DIAGNOSIS — D631 Anemia in chronic kidney disease: Secondary | ICD-10-CM | POA: Diagnosis not present

## 2017-01-17 DIAGNOSIS — R5381 Other malaise: Secondary | ICD-10-CM | POA: Diagnosis not present

## 2017-01-17 DIAGNOSIS — D509 Iron deficiency anemia, unspecified: Secondary | ICD-10-CM | POA: Diagnosis not present

## 2017-01-17 DIAGNOSIS — Z79899 Other long term (current) drug therapy: Secondary | ICD-10-CM | POA: Diagnosis not present

## 2017-01-17 DIAGNOSIS — N186 End stage renal disease: Secondary | ICD-10-CM | POA: Diagnosis not present

## 2017-01-19 DIAGNOSIS — N186 End stage renal disease: Secondary | ICD-10-CM | POA: Diagnosis not present

## 2017-01-19 DIAGNOSIS — D509 Iron deficiency anemia, unspecified: Secondary | ICD-10-CM | POA: Diagnosis not present

## 2017-01-19 DIAGNOSIS — R5381 Other malaise: Secondary | ICD-10-CM | POA: Diagnosis not present

## 2017-01-19 DIAGNOSIS — Z79899 Other long term (current) drug therapy: Secondary | ICD-10-CM | POA: Diagnosis not present

## 2017-01-19 DIAGNOSIS — D631 Anemia in chronic kidney disease: Secondary | ICD-10-CM | POA: Diagnosis not present

## 2017-01-20 DIAGNOSIS — D631 Anemia in chronic kidney disease: Secondary | ICD-10-CM | POA: Diagnosis not present

## 2017-01-20 DIAGNOSIS — N186 End stage renal disease: Secondary | ICD-10-CM | POA: Diagnosis not present

## 2017-01-20 DIAGNOSIS — Z79899 Other long term (current) drug therapy: Secondary | ICD-10-CM | POA: Diagnosis not present

## 2017-01-20 DIAGNOSIS — D509 Iron deficiency anemia, unspecified: Secondary | ICD-10-CM | POA: Diagnosis not present

## 2017-01-20 DIAGNOSIS — R5381 Other malaise: Secondary | ICD-10-CM | POA: Diagnosis not present

## 2017-01-22 DIAGNOSIS — Z79899 Other long term (current) drug therapy: Secondary | ICD-10-CM | POA: Diagnosis not present

## 2017-01-22 DIAGNOSIS — D631 Anemia in chronic kidney disease: Secondary | ICD-10-CM | POA: Diagnosis not present

## 2017-01-22 DIAGNOSIS — R5381 Other malaise: Secondary | ICD-10-CM | POA: Diagnosis not present

## 2017-01-22 DIAGNOSIS — N186 End stage renal disease: Secondary | ICD-10-CM | POA: Diagnosis not present

## 2017-01-22 DIAGNOSIS — D509 Iron deficiency anemia, unspecified: Secondary | ICD-10-CM | POA: Diagnosis not present

## 2017-01-23 DIAGNOSIS — D509 Iron deficiency anemia, unspecified: Secondary | ICD-10-CM | POA: Diagnosis not present

## 2017-01-23 DIAGNOSIS — D631 Anemia in chronic kidney disease: Secondary | ICD-10-CM | POA: Diagnosis not present

## 2017-01-23 DIAGNOSIS — N186 End stage renal disease: Secondary | ICD-10-CM | POA: Diagnosis not present

## 2017-01-23 DIAGNOSIS — R5381 Other malaise: Secondary | ICD-10-CM | POA: Diagnosis not present

## 2017-01-23 DIAGNOSIS — Z79899 Other long term (current) drug therapy: Secondary | ICD-10-CM | POA: Diagnosis not present

## 2017-01-25 DIAGNOSIS — R5381 Other malaise: Secondary | ICD-10-CM | POA: Diagnosis not present

## 2017-01-25 DIAGNOSIS — Z79899 Other long term (current) drug therapy: Secondary | ICD-10-CM | POA: Diagnosis not present

## 2017-01-25 DIAGNOSIS — N186 End stage renal disease: Secondary | ICD-10-CM | POA: Diagnosis not present

## 2017-01-25 DIAGNOSIS — D509 Iron deficiency anemia, unspecified: Secondary | ICD-10-CM | POA: Diagnosis not present

## 2017-01-25 DIAGNOSIS — D631 Anemia in chronic kidney disease: Secondary | ICD-10-CM | POA: Diagnosis not present

## 2017-01-26 DIAGNOSIS — Z79899 Other long term (current) drug therapy: Secondary | ICD-10-CM | POA: Diagnosis not present

## 2017-01-26 DIAGNOSIS — D509 Iron deficiency anemia, unspecified: Secondary | ICD-10-CM | POA: Diagnosis not present

## 2017-01-26 DIAGNOSIS — D631 Anemia in chronic kidney disease: Secondary | ICD-10-CM | POA: Diagnosis not present

## 2017-01-26 DIAGNOSIS — R5381 Other malaise: Secondary | ICD-10-CM | POA: Diagnosis not present

## 2017-01-26 DIAGNOSIS — N186 End stage renal disease: Secondary | ICD-10-CM | POA: Diagnosis not present

## 2017-01-28 DIAGNOSIS — R5381 Other malaise: Secondary | ICD-10-CM | POA: Diagnosis not present

## 2017-01-28 DIAGNOSIS — N186 End stage renal disease: Secondary | ICD-10-CM | POA: Diagnosis not present

## 2017-01-28 DIAGNOSIS — Z79899 Other long term (current) drug therapy: Secondary | ICD-10-CM | POA: Diagnosis not present

## 2017-01-28 DIAGNOSIS — D509 Iron deficiency anemia, unspecified: Secondary | ICD-10-CM | POA: Diagnosis not present

## 2017-01-28 DIAGNOSIS — D631 Anemia in chronic kidney disease: Secondary | ICD-10-CM | POA: Diagnosis not present

## 2017-01-29 DIAGNOSIS — R5381 Other malaise: Secondary | ICD-10-CM | POA: Diagnosis not present

## 2017-01-29 DIAGNOSIS — N186 End stage renal disease: Secondary | ICD-10-CM | POA: Diagnosis not present

## 2017-01-29 DIAGNOSIS — D631 Anemia in chronic kidney disease: Secondary | ICD-10-CM | POA: Diagnosis not present

## 2017-01-29 DIAGNOSIS — Z79899 Other long term (current) drug therapy: Secondary | ICD-10-CM | POA: Diagnosis not present

## 2017-01-29 DIAGNOSIS — D509 Iron deficiency anemia, unspecified: Secondary | ICD-10-CM | POA: Diagnosis not present

## 2017-01-31 ENCOUNTER — Ambulatory Visit (INDEPENDENT_AMBULATORY_CARE_PROVIDER_SITE_OTHER): Payer: Medicare Other | Admitting: Sports Medicine

## 2017-01-31 DIAGNOSIS — R5381 Other malaise: Secondary | ICD-10-CM | POA: Diagnosis not present

## 2017-01-31 DIAGNOSIS — D509 Iron deficiency anemia, unspecified: Secondary | ICD-10-CM | POA: Diagnosis not present

## 2017-01-31 DIAGNOSIS — B351 Tinea unguium: Secondary | ICD-10-CM | POA: Diagnosis not present

## 2017-01-31 DIAGNOSIS — M79675 Pain in left toe(s): Secondary | ICD-10-CM

## 2017-01-31 DIAGNOSIS — N186 End stage renal disease: Secondary | ICD-10-CM | POA: Diagnosis not present

## 2017-01-31 DIAGNOSIS — M79674 Pain in right toe(s): Secondary | ICD-10-CM

## 2017-01-31 DIAGNOSIS — E1140 Type 2 diabetes mellitus with diabetic neuropathy, unspecified: Secondary | ICD-10-CM

## 2017-01-31 DIAGNOSIS — Z79899 Other long term (current) drug therapy: Secondary | ICD-10-CM | POA: Diagnosis not present

## 2017-01-31 DIAGNOSIS — D631 Anemia in chronic kidney disease: Secondary | ICD-10-CM | POA: Diagnosis not present

## 2017-01-31 NOTE — Progress Notes (Signed)
Subjective: Linda Jordan is a 83 y.o. female patient with history of diabetes who presents to office today complaining of long, painful nails  while ambulating in shoes; unable to trim. Patient states that the glucose reading this morning was not recorded; not on meds; last A1c 5. Patient denies any new changes in medication or new problems.  Patient Active Problem List   Diagnosis Date Noted  . Thyroid disease 05/28/2013  . Hypertension 05/28/2013  . Arthritis 02/16/2011  . Bilateral swelling of feet 02/16/2011  . Mycotic toenails 02/16/2011   Current Outpatient Prescriptions on File Prior to Visit  Medication Sig Dispense Refill  . Acetaminophen (TYLENOL EX ST ARTHRITIS PAIN PO) Take by mouth.      Marland Kitchen acetaminophen (TYLENOL) 500 MG tablet Take by mouth.    Marland Kitchen amLODipine (NORVASC) 10 MG tablet Take 10 mg by mouth daily.      Marland Kitchen aspirin EC 81 MG tablet Take 81 mg by mouth.    Marland Kitchen atorvastatin (LIPITOR) 80 MG tablet Take 80 mg by mouth.    Marland Kitchen b complex vitamins capsule Take by mouth.    Marland Kitchen buPROPion (WELLBUTRIN SR) 100 MG 12 hr tablet TAKE 1 TABLET (100 MG TOTAL) BY MOUTH DAILY.  4  . carvedilol (COREG) 3.125 MG tablet Take 3.125 mg by mouth.    . cetirizine (ZYRTEC) 10 MG tablet 1 (ONE) TABLET BY MOUTH AT BEDTIME  12  . hydrochlorothiazide (HYDRODIURIL) 25 MG tablet Take 25 mg by mouth daily.    Marland Kitchen letrozole (FEMARA) 2.5 MG tablet 1 TABLET ONCE DAILY - TAKE DAILY FOR HORMONAL PROTECTION AGAINST FUTURE BREAST CANCER  3  . levothyroxine (SYNTHROID) 25 MCG tablet Take by mouth.    . losartan (COZAAR) 50 MG tablet TAKE 1 TABLET BY MOUTH AT BEDTIME (STOP LISINOPRIL)  11  . metoprolol (LOPRESSOR) 50 MG tablet Take 50 mg by mouth 2 (two) times daily.    . Multiple Vitamin (MULTIVITAMIN) tablet Take 1 tablet by mouth daily.    . Nebivolol HCl (BYSTOLIC) 20 MG TABS Take by mouth.      . Omega-3 Fatty Acids (FISH OIL CONCENTRATE) 300 MG CAPS Take by mouth.    Marland Kitchen omeprazole (PRILOSEC) 40 MG capsule Take  40 mg by mouth daily.      Marland Kitchen oxycodone (OXY-IR) 5 MG capsule TAKE ONE TO TWO CAPSULES BY MOUTH EVERY 4 HOURS AS NEEDED FOR PAIN  0  . pregabalin (LYRICA) 75 MG capsule Take 75 mg by mouth 2 (two) times daily.      . rosuvastatin (CRESTOR) 10 MG tablet Take 10 mg by mouth daily.      Marland Kitchen spironolactone (ALDACTONE) 100 MG tablet Take 100 mg by mouth.    . warfarin (COUMADIN) 2 MG tablet Take 2 mg by mouth daily.       No current facility-administered medications on file prior to visit.    Allergies  Allergen Reactions  . Codeine Nausea And Vomiting  . Other   . Sulfa Antibiotics Nausea And Vomiting  . Tape     No results found for this or any previous visit (from the past 2160 hour(s)).  Objective: General: Patient is awake, alert, and oriented x 3 and in no acute distress.  Integument: Skin is warm, dry and supple bilateral. Nails are tender, long, thickened and dystrophic with subungual debris, consistent with onychomycosis, 1-5 bilateral. No signs of infection. No open lesions or preulcerative lesions present bilateral. Remaining integument unremarkable.  Vasculature:  Dorsalis Pedis  pulse 1/4 bilateral. Posterior Tibial pulse  1/4 bilateral.  Capillary fill time <3 sec 1-5 bilateral. Scant hair growth to the level of the digits. Temperature gradient within normal limits. Trace varicosities present bilateral. No edema present bilateral.   Neurology: The patient has absent sensation measured with a 5.07/10g Semmes Weinstein Monofilament at all pedal sites bilateral . Vibratory sensation absent bilateral with tuning fork. No Babinski sign present bilateral.   Musculoskeletal: Asymptomatic hammertoes pedal deformities noted bilateral. Muscular strength 5/5 in all lower extremity muscular groups bilateral without pain on range of motion . No tenderness with calf compression bilateral.  Assessment and Plan: Problem List Items Addressed This Visit    None    Visit Diagnoses    Pain due  to onychomycosis of toenails of both feet    -  Primary   Type 2 diabetes, controlled, with neuropathy ([place])         -Examined patient. -Discussed and educated patient on diabetic foot care, especially with  regards to the vascular, neurological and musculoskeletal systems.  -Stressed the importance of good glycemic control and the detriment of not  controlling glucose levels in relation to the foot. -Mechanically debrided all nails 1-5 bilateral using sterile nail nipper and filed with dremel without incident -Gave toe spacers and instructed on use  -Answered all patient questions -Patient to return  in 3 months for at risk foot care -Patient advised to call the office if any problems or questions arise in the meantime.  Landis Martins, DPM

## 2017-02-01 DIAGNOSIS — D509 Iron deficiency anemia, unspecified: Secondary | ICD-10-CM | POA: Diagnosis not present

## 2017-02-01 DIAGNOSIS — N186 End stage renal disease: Secondary | ICD-10-CM | POA: Diagnosis not present

## 2017-02-01 DIAGNOSIS — D631 Anemia in chronic kidney disease: Secondary | ICD-10-CM | POA: Diagnosis not present

## 2017-02-01 DIAGNOSIS — Z79899 Other long term (current) drug therapy: Secondary | ICD-10-CM | POA: Diagnosis not present

## 2017-02-01 DIAGNOSIS — R5381 Other malaise: Secondary | ICD-10-CM | POA: Diagnosis not present

## 2017-02-02 DIAGNOSIS — D631 Anemia in chronic kidney disease: Secondary | ICD-10-CM | POA: Diagnosis not present

## 2017-02-02 DIAGNOSIS — N186 End stage renal disease: Secondary | ICD-10-CM | POA: Diagnosis not present

## 2017-02-02 DIAGNOSIS — D509 Iron deficiency anemia, unspecified: Secondary | ICD-10-CM | POA: Diagnosis not present

## 2017-02-02 DIAGNOSIS — R5381 Other malaise: Secondary | ICD-10-CM | POA: Diagnosis not present

## 2017-02-02 DIAGNOSIS — Z79899 Other long term (current) drug therapy: Secondary | ICD-10-CM | POA: Diagnosis not present

## 2017-02-03 DIAGNOSIS — Z79899 Other long term (current) drug therapy: Secondary | ICD-10-CM | POA: Diagnosis not present

## 2017-02-03 DIAGNOSIS — D631 Anemia in chronic kidney disease: Secondary | ICD-10-CM | POA: Diagnosis not present

## 2017-02-03 DIAGNOSIS — N186 End stage renal disease: Secondary | ICD-10-CM | POA: Diagnosis not present

## 2017-02-03 DIAGNOSIS — R5381 Other malaise: Secondary | ICD-10-CM | POA: Diagnosis not present

## 2017-02-03 DIAGNOSIS — D509 Iron deficiency anemia, unspecified: Secondary | ICD-10-CM | POA: Diagnosis not present

## 2017-02-04 DIAGNOSIS — D631 Anemia in chronic kidney disease: Secondary | ICD-10-CM | POA: Diagnosis not present

## 2017-02-04 DIAGNOSIS — N186 End stage renal disease: Secondary | ICD-10-CM | POA: Diagnosis not present

## 2017-02-04 DIAGNOSIS — Z79899 Other long term (current) drug therapy: Secondary | ICD-10-CM | POA: Diagnosis not present

## 2017-02-04 DIAGNOSIS — R5381 Other malaise: Secondary | ICD-10-CM | POA: Diagnosis not present

## 2017-02-04 DIAGNOSIS — D509 Iron deficiency anemia, unspecified: Secondary | ICD-10-CM | POA: Diagnosis not present

## 2017-02-06 DIAGNOSIS — N059 Unspecified nephritic syndrome with unspecified morphologic changes: Secondary | ICD-10-CM | POA: Diagnosis not present

## 2017-02-06 DIAGNOSIS — Z992 Dependence on renal dialysis: Secondary | ICD-10-CM | POA: Diagnosis not present

## 2017-02-06 DIAGNOSIS — R5381 Other malaise: Secondary | ICD-10-CM | POA: Diagnosis not present

## 2017-02-06 DIAGNOSIS — D631 Anemia in chronic kidney disease: Secondary | ICD-10-CM | POA: Diagnosis not present

## 2017-02-06 DIAGNOSIS — N186 End stage renal disease: Secondary | ICD-10-CM | POA: Diagnosis not present

## 2017-02-06 DIAGNOSIS — D509 Iron deficiency anemia, unspecified: Secondary | ICD-10-CM | POA: Diagnosis not present

## 2017-02-06 DIAGNOSIS — Z79899 Other long term (current) drug therapy: Secondary | ICD-10-CM | POA: Diagnosis not present

## 2017-02-07 DIAGNOSIS — R5381 Other malaise: Secondary | ICD-10-CM | POA: Diagnosis not present

## 2017-02-07 DIAGNOSIS — N186 End stage renal disease: Secondary | ICD-10-CM | POA: Diagnosis not present

## 2017-02-07 DIAGNOSIS — N2581 Secondary hyperparathyroidism of renal origin: Secondary | ICD-10-CM | POA: Diagnosis not present

## 2017-02-07 DIAGNOSIS — D631 Anemia in chronic kidney disease: Secondary | ICD-10-CM | POA: Diagnosis not present

## 2017-02-07 DIAGNOSIS — Z79899 Other long term (current) drug therapy: Secondary | ICD-10-CM | POA: Diagnosis not present

## 2017-02-07 DIAGNOSIS — D509 Iron deficiency anemia, unspecified: Secondary | ICD-10-CM | POA: Diagnosis not present

## 2017-02-09 DIAGNOSIS — Z79899 Other long term (current) drug therapy: Secondary | ICD-10-CM | POA: Diagnosis not present

## 2017-02-09 DIAGNOSIS — N2581 Secondary hyperparathyroidism of renal origin: Secondary | ICD-10-CM | POA: Diagnosis not present

## 2017-02-09 DIAGNOSIS — N186 End stage renal disease: Secondary | ICD-10-CM | POA: Diagnosis not present

## 2017-02-09 DIAGNOSIS — D509 Iron deficiency anemia, unspecified: Secondary | ICD-10-CM | POA: Diagnosis not present

## 2017-02-09 DIAGNOSIS — D631 Anemia in chronic kidney disease: Secondary | ICD-10-CM | POA: Diagnosis not present

## 2017-02-09 DIAGNOSIS — R5381 Other malaise: Secondary | ICD-10-CM | POA: Diagnosis not present

## 2017-02-10 DIAGNOSIS — Z79899 Other long term (current) drug therapy: Secondary | ICD-10-CM | POA: Diagnosis not present

## 2017-02-10 DIAGNOSIS — D509 Iron deficiency anemia, unspecified: Secondary | ICD-10-CM | POA: Diagnosis not present

## 2017-02-10 DIAGNOSIS — N2581 Secondary hyperparathyroidism of renal origin: Secondary | ICD-10-CM | POA: Diagnosis not present

## 2017-02-10 DIAGNOSIS — R5381 Other malaise: Secondary | ICD-10-CM | POA: Diagnosis not present

## 2017-02-10 DIAGNOSIS — D631 Anemia in chronic kidney disease: Secondary | ICD-10-CM | POA: Diagnosis not present

## 2017-02-10 DIAGNOSIS — N186 End stage renal disease: Secondary | ICD-10-CM | POA: Diagnosis not present

## 2017-02-12 DIAGNOSIS — Z79899 Other long term (current) drug therapy: Secondary | ICD-10-CM | POA: Diagnosis not present

## 2017-02-12 DIAGNOSIS — R5381 Other malaise: Secondary | ICD-10-CM | POA: Diagnosis not present

## 2017-02-12 DIAGNOSIS — D509 Iron deficiency anemia, unspecified: Secondary | ICD-10-CM | POA: Diagnosis not present

## 2017-02-12 DIAGNOSIS — N2581 Secondary hyperparathyroidism of renal origin: Secondary | ICD-10-CM | POA: Diagnosis not present

## 2017-02-12 DIAGNOSIS — D631 Anemia in chronic kidney disease: Secondary | ICD-10-CM | POA: Diagnosis not present

## 2017-02-12 DIAGNOSIS — N186 End stage renal disease: Secondary | ICD-10-CM | POA: Diagnosis not present

## 2017-02-13 DIAGNOSIS — Z79899 Other long term (current) drug therapy: Secondary | ICD-10-CM | POA: Diagnosis not present

## 2017-02-13 DIAGNOSIS — D631 Anemia in chronic kidney disease: Secondary | ICD-10-CM | POA: Diagnosis not present

## 2017-02-13 DIAGNOSIS — D509 Iron deficiency anemia, unspecified: Secondary | ICD-10-CM | POA: Diagnosis not present

## 2017-02-13 DIAGNOSIS — R5381 Other malaise: Secondary | ICD-10-CM | POA: Diagnosis not present

## 2017-02-13 DIAGNOSIS — N2581 Secondary hyperparathyroidism of renal origin: Secondary | ICD-10-CM | POA: Diagnosis not present

## 2017-02-13 DIAGNOSIS — N186 End stage renal disease: Secondary | ICD-10-CM | POA: Diagnosis not present

## 2017-02-14 DIAGNOSIS — R197 Diarrhea, unspecified: Secondary | ICD-10-CM | POA: Diagnosis not present

## 2017-02-14 DIAGNOSIS — E039 Hypothyroidism, unspecified: Secondary | ICD-10-CM | POA: Diagnosis not present

## 2017-02-14 DIAGNOSIS — N186 End stage renal disease: Secondary | ICD-10-CM | POA: Diagnosis not present

## 2017-02-14 DIAGNOSIS — I82409 Acute embolism and thrombosis of unspecified deep veins of unspecified lower extremity: Secondary | ICD-10-CM | POA: Diagnosis not present

## 2017-02-14 DIAGNOSIS — K219 Gastro-esophageal reflux disease without esophagitis: Secondary | ICD-10-CM | POA: Diagnosis not present

## 2017-02-14 DIAGNOSIS — J449 Chronic obstructive pulmonary disease, unspecified: Secondary | ICD-10-CM | POA: Diagnosis not present

## 2017-02-14 DIAGNOSIS — E1121 Type 2 diabetes mellitus with diabetic nephropathy: Secondary | ICD-10-CM | POA: Diagnosis not present

## 2017-02-14 DIAGNOSIS — E785 Hyperlipidemia, unspecified: Secondary | ICD-10-CM | POA: Diagnosis not present

## 2017-02-14 DIAGNOSIS — R600 Localized edema: Secondary | ICD-10-CM | POA: Diagnosis not present

## 2017-02-14 DIAGNOSIS — J309 Allergic rhinitis, unspecified: Secondary | ICD-10-CM | POA: Diagnosis not present

## 2017-02-14 DIAGNOSIS — B0229 Other postherpetic nervous system involvement: Secondary | ICD-10-CM | POA: Diagnosis not present

## 2017-02-14 DIAGNOSIS — M545 Low back pain: Secondary | ICD-10-CM | POA: Diagnosis not present

## 2017-02-15 DIAGNOSIS — Z79899 Other long term (current) drug therapy: Secondary | ICD-10-CM | POA: Diagnosis not present

## 2017-02-15 DIAGNOSIS — N2581 Secondary hyperparathyroidism of renal origin: Secondary | ICD-10-CM | POA: Diagnosis not present

## 2017-02-15 DIAGNOSIS — N186 End stage renal disease: Secondary | ICD-10-CM | POA: Diagnosis not present

## 2017-02-15 DIAGNOSIS — D509 Iron deficiency anemia, unspecified: Secondary | ICD-10-CM | POA: Diagnosis not present

## 2017-02-15 DIAGNOSIS — D631 Anemia in chronic kidney disease: Secondary | ICD-10-CM | POA: Diagnosis not present

## 2017-02-15 DIAGNOSIS — R5381 Other malaise: Secondary | ICD-10-CM | POA: Diagnosis not present

## 2017-02-16 DIAGNOSIS — D631 Anemia in chronic kidney disease: Secondary | ICD-10-CM | POA: Diagnosis not present

## 2017-02-16 DIAGNOSIS — D509 Iron deficiency anemia, unspecified: Secondary | ICD-10-CM | POA: Diagnosis not present

## 2017-02-16 DIAGNOSIS — N186 End stage renal disease: Secondary | ICD-10-CM | POA: Diagnosis not present

## 2017-02-16 DIAGNOSIS — N2581 Secondary hyperparathyroidism of renal origin: Secondary | ICD-10-CM | POA: Diagnosis not present

## 2017-02-16 DIAGNOSIS — Z79899 Other long term (current) drug therapy: Secondary | ICD-10-CM | POA: Diagnosis not present

## 2017-02-16 DIAGNOSIS — R5381 Other malaise: Secondary | ICD-10-CM | POA: Diagnosis not present

## 2017-02-18 DIAGNOSIS — Z79899 Other long term (current) drug therapy: Secondary | ICD-10-CM | POA: Diagnosis not present

## 2017-02-18 DIAGNOSIS — N186 End stage renal disease: Secondary | ICD-10-CM | POA: Diagnosis not present

## 2017-02-18 DIAGNOSIS — N2581 Secondary hyperparathyroidism of renal origin: Secondary | ICD-10-CM | POA: Diagnosis not present

## 2017-02-18 DIAGNOSIS — D509 Iron deficiency anemia, unspecified: Secondary | ICD-10-CM | POA: Diagnosis not present

## 2017-02-18 DIAGNOSIS — R5381 Other malaise: Secondary | ICD-10-CM | POA: Diagnosis not present

## 2017-02-18 DIAGNOSIS — D631 Anemia in chronic kidney disease: Secondary | ICD-10-CM | POA: Diagnosis not present

## 2017-02-19 DIAGNOSIS — D631 Anemia in chronic kidney disease: Secondary | ICD-10-CM | POA: Diagnosis not present

## 2017-02-19 DIAGNOSIS — Z79899 Other long term (current) drug therapy: Secondary | ICD-10-CM | POA: Diagnosis not present

## 2017-02-19 DIAGNOSIS — N2581 Secondary hyperparathyroidism of renal origin: Secondary | ICD-10-CM | POA: Diagnosis not present

## 2017-02-19 DIAGNOSIS — N186 End stage renal disease: Secondary | ICD-10-CM | POA: Diagnosis not present

## 2017-02-19 DIAGNOSIS — R5381 Other malaise: Secondary | ICD-10-CM | POA: Diagnosis not present

## 2017-02-19 DIAGNOSIS — D509 Iron deficiency anemia, unspecified: Secondary | ICD-10-CM | POA: Diagnosis not present

## 2017-02-21 DIAGNOSIS — R5381 Other malaise: Secondary | ICD-10-CM | POA: Diagnosis not present

## 2017-02-21 DIAGNOSIS — Z79899 Other long term (current) drug therapy: Secondary | ICD-10-CM | POA: Diagnosis not present

## 2017-02-21 DIAGNOSIS — N2581 Secondary hyperparathyroidism of renal origin: Secondary | ICD-10-CM | POA: Diagnosis not present

## 2017-02-21 DIAGNOSIS — D631 Anemia in chronic kidney disease: Secondary | ICD-10-CM | POA: Diagnosis not present

## 2017-02-21 DIAGNOSIS — N186 End stage renal disease: Secondary | ICD-10-CM | POA: Diagnosis not present

## 2017-02-21 DIAGNOSIS — D509 Iron deficiency anemia, unspecified: Secondary | ICD-10-CM | POA: Diagnosis not present

## 2017-02-22 DIAGNOSIS — N2581 Secondary hyperparathyroidism of renal origin: Secondary | ICD-10-CM | POA: Diagnosis not present

## 2017-02-22 DIAGNOSIS — D631 Anemia in chronic kidney disease: Secondary | ICD-10-CM | POA: Diagnosis not present

## 2017-02-22 DIAGNOSIS — N186 End stage renal disease: Secondary | ICD-10-CM | POA: Diagnosis not present

## 2017-02-22 DIAGNOSIS — Z79899 Other long term (current) drug therapy: Secondary | ICD-10-CM | POA: Diagnosis not present

## 2017-02-22 DIAGNOSIS — R5381 Other malaise: Secondary | ICD-10-CM | POA: Diagnosis not present

## 2017-02-22 DIAGNOSIS — D509 Iron deficiency anemia, unspecified: Secondary | ICD-10-CM | POA: Diagnosis not present

## 2017-02-24 DIAGNOSIS — R5381 Other malaise: Secondary | ICD-10-CM | POA: Diagnosis not present

## 2017-02-24 DIAGNOSIS — D509 Iron deficiency anemia, unspecified: Secondary | ICD-10-CM | POA: Diagnosis not present

## 2017-02-24 DIAGNOSIS — N2581 Secondary hyperparathyroidism of renal origin: Secondary | ICD-10-CM | POA: Diagnosis not present

## 2017-02-24 DIAGNOSIS — D631 Anemia in chronic kidney disease: Secondary | ICD-10-CM | POA: Diagnosis not present

## 2017-02-24 DIAGNOSIS — N186 End stage renal disease: Secondary | ICD-10-CM | POA: Diagnosis not present

## 2017-02-24 DIAGNOSIS — Z79899 Other long term (current) drug therapy: Secondary | ICD-10-CM | POA: Diagnosis not present

## 2017-02-25 DIAGNOSIS — N2581 Secondary hyperparathyroidism of renal origin: Secondary | ICD-10-CM | POA: Diagnosis not present

## 2017-02-25 DIAGNOSIS — N186 End stage renal disease: Secondary | ICD-10-CM | POA: Diagnosis not present

## 2017-02-25 DIAGNOSIS — R5381 Other malaise: Secondary | ICD-10-CM | POA: Diagnosis not present

## 2017-02-25 DIAGNOSIS — D631 Anemia in chronic kidney disease: Secondary | ICD-10-CM | POA: Diagnosis not present

## 2017-02-25 DIAGNOSIS — Z79899 Other long term (current) drug therapy: Secondary | ICD-10-CM | POA: Diagnosis not present

## 2017-02-25 DIAGNOSIS — D509 Iron deficiency anemia, unspecified: Secondary | ICD-10-CM | POA: Diagnosis not present

## 2017-02-27 DIAGNOSIS — N2581 Secondary hyperparathyroidism of renal origin: Secondary | ICD-10-CM | POA: Diagnosis not present

## 2017-02-27 DIAGNOSIS — R5381 Other malaise: Secondary | ICD-10-CM | POA: Diagnosis not present

## 2017-02-27 DIAGNOSIS — D631 Anemia in chronic kidney disease: Secondary | ICD-10-CM | POA: Diagnosis not present

## 2017-02-27 DIAGNOSIS — N186 End stage renal disease: Secondary | ICD-10-CM | POA: Diagnosis not present

## 2017-02-27 DIAGNOSIS — Z79899 Other long term (current) drug therapy: Secondary | ICD-10-CM | POA: Diagnosis not present

## 2017-02-27 DIAGNOSIS — D509 Iron deficiency anemia, unspecified: Secondary | ICD-10-CM | POA: Diagnosis not present

## 2017-02-28 DIAGNOSIS — N2581 Secondary hyperparathyroidism of renal origin: Secondary | ICD-10-CM | POA: Diagnosis not present

## 2017-02-28 DIAGNOSIS — D509 Iron deficiency anemia, unspecified: Secondary | ICD-10-CM | POA: Diagnosis not present

## 2017-02-28 DIAGNOSIS — N186 End stage renal disease: Secondary | ICD-10-CM | POA: Diagnosis not present

## 2017-02-28 DIAGNOSIS — Z79899 Other long term (current) drug therapy: Secondary | ICD-10-CM | POA: Diagnosis not present

## 2017-02-28 DIAGNOSIS — D631 Anemia in chronic kidney disease: Secondary | ICD-10-CM | POA: Diagnosis not present

## 2017-02-28 DIAGNOSIS — R5381 Other malaise: Secondary | ICD-10-CM | POA: Diagnosis not present

## 2017-03-02 DIAGNOSIS — R5381 Other malaise: Secondary | ICD-10-CM | POA: Diagnosis not present

## 2017-03-02 DIAGNOSIS — D509 Iron deficiency anemia, unspecified: Secondary | ICD-10-CM | POA: Diagnosis not present

## 2017-03-02 DIAGNOSIS — N186 End stage renal disease: Secondary | ICD-10-CM | POA: Diagnosis not present

## 2017-03-02 DIAGNOSIS — N2581 Secondary hyperparathyroidism of renal origin: Secondary | ICD-10-CM | POA: Diagnosis not present

## 2017-03-02 DIAGNOSIS — D631 Anemia in chronic kidney disease: Secondary | ICD-10-CM | POA: Diagnosis not present

## 2017-03-02 DIAGNOSIS — Z79899 Other long term (current) drug therapy: Secondary | ICD-10-CM | POA: Diagnosis not present

## 2017-03-03 DIAGNOSIS — D509 Iron deficiency anemia, unspecified: Secondary | ICD-10-CM | POA: Diagnosis not present

## 2017-03-03 DIAGNOSIS — R5381 Other malaise: Secondary | ICD-10-CM | POA: Diagnosis not present

## 2017-03-03 DIAGNOSIS — N2581 Secondary hyperparathyroidism of renal origin: Secondary | ICD-10-CM | POA: Diagnosis not present

## 2017-03-03 DIAGNOSIS — Z79899 Other long term (current) drug therapy: Secondary | ICD-10-CM | POA: Diagnosis not present

## 2017-03-03 DIAGNOSIS — N186 End stage renal disease: Secondary | ICD-10-CM | POA: Diagnosis not present

## 2017-03-03 DIAGNOSIS — D631 Anemia in chronic kidney disease: Secondary | ICD-10-CM | POA: Diagnosis not present

## 2017-03-05 DIAGNOSIS — D631 Anemia in chronic kidney disease: Secondary | ICD-10-CM | POA: Diagnosis not present

## 2017-03-05 DIAGNOSIS — Z79899 Other long term (current) drug therapy: Secondary | ICD-10-CM | POA: Diagnosis not present

## 2017-03-05 DIAGNOSIS — D509 Iron deficiency anemia, unspecified: Secondary | ICD-10-CM | POA: Diagnosis not present

## 2017-03-05 DIAGNOSIS — R5381 Other malaise: Secondary | ICD-10-CM | POA: Diagnosis not present

## 2017-03-05 DIAGNOSIS — N2581 Secondary hyperparathyroidism of renal origin: Secondary | ICD-10-CM | POA: Diagnosis not present

## 2017-03-05 DIAGNOSIS — N186 End stage renal disease: Secondary | ICD-10-CM | POA: Diagnosis not present

## 2017-03-07 DIAGNOSIS — N186 End stage renal disease: Secondary | ICD-10-CM | POA: Diagnosis not present

## 2017-03-07 DIAGNOSIS — D509 Iron deficiency anemia, unspecified: Secondary | ICD-10-CM | POA: Diagnosis not present

## 2017-03-07 DIAGNOSIS — Z79899 Other long term (current) drug therapy: Secondary | ICD-10-CM | POA: Diagnosis not present

## 2017-03-07 DIAGNOSIS — D631 Anemia in chronic kidney disease: Secondary | ICD-10-CM | POA: Diagnosis not present

## 2017-03-07 DIAGNOSIS — R5381 Other malaise: Secondary | ICD-10-CM | POA: Diagnosis not present

## 2017-03-07 DIAGNOSIS — N2581 Secondary hyperparathyroidism of renal origin: Secondary | ICD-10-CM | POA: Diagnosis not present

## 2017-03-08 DIAGNOSIS — Z79899 Other long term (current) drug therapy: Secondary | ICD-10-CM | POA: Diagnosis not present

## 2017-03-08 DIAGNOSIS — D631 Anemia in chronic kidney disease: Secondary | ICD-10-CM | POA: Diagnosis not present

## 2017-03-08 DIAGNOSIS — N2581 Secondary hyperparathyroidism of renal origin: Secondary | ICD-10-CM | POA: Diagnosis not present

## 2017-03-08 DIAGNOSIS — D509 Iron deficiency anemia, unspecified: Secondary | ICD-10-CM | POA: Diagnosis not present

## 2017-03-08 DIAGNOSIS — N186 End stage renal disease: Secondary | ICD-10-CM | POA: Diagnosis not present

## 2017-03-08 DIAGNOSIS — R5381 Other malaise: Secondary | ICD-10-CM | POA: Diagnosis not present

## 2017-03-09 DIAGNOSIS — Z79899 Other long term (current) drug therapy: Secondary | ICD-10-CM | POA: Diagnosis not present

## 2017-03-09 DIAGNOSIS — N2581 Secondary hyperparathyroidism of renal origin: Secondary | ICD-10-CM | POA: Diagnosis not present

## 2017-03-09 DIAGNOSIS — R5381 Other malaise: Secondary | ICD-10-CM | POA: Diagnosis not present

## 2017-03-09 DIAGNOSIS — N186 End stage renal disease: Secondary | ICD-10-CM | POA: Diagnosis not present

## 2017-03-09 DIAGNOSIS — D631 Anemia in chronic kidney disease: Secondary | ICD-10-CM | POA: Diagnosis not present

## 2017-03-09 DIAGNOSIS — D509 Iron deficiency anemia, unspecified: Secondary | ICD-10-CM | POA: Diagnosis not present

## 2017-03-11 DIAGNOSIS — D509 Iron deficiency anemia, unspecified: Secondary | ICD-10-CM | POA: Diagnosis not present

## 2017-03-11 DIAGNOSIS — N186 End stage renal disease: Secondary | ICD-10-CM | POA: Diagnosis not present

## 2017-03-11 DIAGNOSIS — Z79899 Other long term (current) drug therapy: Secondary | ICD-10-CM | POA: Diagnosis not present

## 2017-03-11 DIAGNOSIS — R5381 Other malaise: Secondary | ICD-10-CM | POA: Diagnosis not present

## 2017-03-11 DIAGNOSIS — D631 Anemia in chronic kidney disease: Secondary | ICD-10-CM | POA: Diagnosis not present

## 2017-03-11 DIAGNOSIS — Z23 Encounter for immunization: Secondary | ICD-10-CM | POA: Diagnosis not present

## 2017-03-11 DIAGNOSIS — N2581 Secondary hyperparathyroidism of renal origin: Secondary | ICD-10-CM | POA: Diagnosis not present

## 2017-03-12 DIAGNOSIS — D509 Iron deficiency anemia, unspecified: Secondary | ICD-10-CM | POA: Diagnosis not present

## 2017-03-12 DIAGNOSIS — R5381 Other malaise: Secondary | ICD-10-CM | POA: Diagnosis not present

## 2017-03-12 DIAGNOSIS — N2581 Secondary hyperparathyroidism of renal origin: Secondary | ICD-10-CM | POA: Diagnosis not present

## 2017-03-12 DIAGNOSIS — Z79899 Other long term (current) drug therapy: Secondary | ICD-10-CM | POA: Diagnosis not present

## 2017-03-12 DIAGNOSIS — N186 End stage renal disease: Secondary | ICD-10-CM | POA: Diagnosis not present

## 2017-03-12 DIAGNOSIS — Z23 Encounter for immunization: Secondary | ICD-10-CM | POA: Diagnosis not present

## 2017-03-14 DIAGNOSIS — D509 Iron deficiency anemia, unspecified: Secondary | ICD-10-CM | POA: Diagnosis not present

## 2017-03-14 DIAGNOSIS — Z23 Encounter for immunization: Secondary | ICD-10-CM | POA: Diagnosis not present

## 2017-03-14 DIAGNOSIS — N2581 Secondary hyperparathyroidism of renal origin: Secondary | ICD-10-CM | POA: Diagnosis not present

## 2017-03-14 DIAGNOSIS — N186 End stage renal disease: Secondary | ICD-10-CM | POA: Diagnosis not present

## 2017-03-14 DIAGNOSIS — R5381 Other malaise: Secondary | ICD-10-CM | POA: Diagnosis not present

## 2017-03-14 DIAGNOSIS — Z79899 Other long term (current) drug therapy: Secondary | ICD-10-CM | POA: Diagnosis not present

## 2017-03-15 DIAGNOSIS — D509 Iron deficiency anemia, unspecified: Secondary | ICD-10-CM | POA: Diagnosis not present

## 2017-03-15 DIAGNOSIS — N186 End stage renal disease: Secondary | ICD-10-CM | POA: Diagnosis not present

## 2017-03-15 DIAGNOSIS — Z23 Encounter for immunization: Secondary | ICD-10-CM | POA: Diagnosis not present

## 2017-03-15 DIAGNOSIS — N2581 Secondary hyperparathyroidism of renal origin: Secondary | ICD-10-CM | POA: Diagnosis not present

## 2017-03-15 DIAGNOSIS — Z79899 Other long term (current) drug therapy: Secondary | ICD-10-CM | POA: Diagnosis not present

## 2017-03-15 DIAGNOSIS — R5381 Other malaise: Secondary | ICD-10-CM | POA: Diagnosis not present

## 2017-03-17 DIAGNOSIS — Z79899 Other long term (current) drug therapy: Secondary | ICD-10-CM | POA: Diagnosis not present

## 2017-03-17 DIAGNOSIS — D509 Iron deficiency anemia, unspecified: Secondary | ICD-10-CM | POA: Diagnosis not present

## 2017-03-17 DIAGNOSIS — R5381 Other malaise: Secondary | ICD-10-CM | POA: Diagnosis not present

## 2017-03-17 DIAGNOSIS — N186 End stage renal disease: Secondary | ICD-10-CM | POA: Diagnosis not present

## 2017-03-17 DIAGNOSIS — N2581 Secondary hyperparathyroidism of renal origin: Secondary | ICD-10-CM | POA: Diagnosis not present

## 2017-03-17 DIAGNOSIS — Z23 Encounter for immunization: Secondary | ICD-10-CM | POA: Diagnosis not present

## 2017-03-18 DIAGNOSIS — D509 Iron deficiency anemia, unspecified: Secondary | ICD-10-CM | POA: Diagnosis not present

## 2017-03-18 DIAGNOSIS — N2581 Secondary hyperparathyroidism of renal origin: Secondary | ICD-10-CM | POA: Diagnosis not present

## 2017-03-18 DIAGNOSIS — Z23 Encounter for immunization: Secondary | ICD-10-CM | POA: Diagnosis not present

## 2017-03-18 DIAGNOSIS — R5381 Other malaise: Secondary | ICD-10-CM | POA: Diagnosis not present

## 2017-03-18 DIAGNOSIS — N186 End stage renal disease: Secondary | ICD-10-CM | POA: Diagnosis not present

## 2017-03-18 DIAGNOSIS — Z79899 Other long term (current) drug therapy: Secondary | ICD-10-CM | POA: Diagnosis not present

## 2017-03-20 DIAGNOSIS — N186 End stage renal disease: Secondary | ICD-10-CM | POA: Diagnosis not present

## 2017-03-20 DIAGNOSIS — D509 Iron deficiency anemia, unspecified: Secondary | ICD-10-CM | POA: Diagnosis not present

## 2017-03-20 DIAGNOSIS — Z23 Encounter for immunization: Secondary | ICD-10-CM | POA: Diagnosis not present

## 2017-03-20 DIAGNOSIS — Z79899 Other long term (current) drug therapy: Secondary | ICD-10-CM | POA: Diagnosis not present

## 2017-03-20 DIAGNOSIS — R5381 Other malaise: Secondary | ICD-10-CM | POA: Diagnosis not present

## 2017-03-20 DIAGNOSIS — N2581 Secondary hyperparathyroidism of renal origin: Secondary | ICD-10-CM | POA: Diagnosis not present

## 2017-03-21 DIAGNOSIS — Z23 Encounter for immunization: Secondary | ICD-10-CM | POA: Diagnosis not present

## 2017-03-21 DIAGNOSIS — N2581 Secondary hyperparathyroidism of renal origin: Secondary | ICD-10-CM | POA: Diagnosis not present

## 2017-03-21 DIAGNOSIS — R5381 Other malaise: Secondary | ICD-10-CM | POA: Diagnosis not present

## 2017-03-21 DIAGNOSIS — N186 End stage renal disease: Secondary | ICD-10-CM | POA: Diagnosis not present

## 2017-03-21 DIAGNOSIS — D509 Iron deficiency anemia, unspecified: Secondary | ICD-10-CM | POA: Diagnosis not present

## 2017-03-21 DIAGNOSIS — Z79899 Other long term (current) drug therapy: Secondary | ICD-10-CM | POA: Diagnosis not present

## 2017-03-22 DIAGNOSIS — M25562 Pain in left knee: Secondary | ICD-10-CM | POA: Diagnosis not present

## 2017-03-22 DIAGNOSIS — M545 Low back pain: Secondary | ICD-10-CM | POA: Diagnosis not present

## 2017-03-22 DIAGNOSIS — I82409 Acute embolism and thrombosis of unspecified deep veins of unspecified lower extremity: Secondary | ICD-10-CM | POA: Diagnosis not present

## 2017-03-22 DIAGNOSIS — Z9181 History of falling: Secondary | ICD-10-CM | POA: Diagnosis not present

## 2017-03-22 DIAGNOSIS — E1121 Type 2 diabetes mellitus with diabetic nephropathy: Secondary | ICD-10-CM | POA: Diagnosis not present

## 2017-03-22 DIAGNOSIS — K219 Gastro-esophageal reflux disease without esophagitis: Secondary | ICD-10-CM | POA: Diagnosis not present

## 2017-03-22 DIAGNOSIS — B0229 Other postherpetic nervous system involvement: Secondary | ICD-10-CM | POA: Diagnosis not present

## 2017-03-22 DIAGNOSIS — R600 Localized edema: Secondary | ICD-10-CM | POA: Diagnosis not present

## 2017-03-22 DIAGNOSIS — J309 Allergic rhinitis, unspecified: Secondary | ICD-10-CM | POA: Diagnosis not present

## 2017-03-22 DIAGNOSIS — Z1389 Encounter for screening for other disorder: Secondary | ICD-10-CM | POA: Diagnosis not present

## 2017-03-22 DIAGNOSIS — N186 End stage renal disease: Secondary | ICD-10-CM | POA: Diagnosis not present

## 2017-03-22 DIAGNOSIS — R197 Diarrhea, unspecified: Secondary | ICD-10-CM | POA: Diagnosis not present

## 2017-03-22 DIAGNOSIS — J449 Chronic obstructive pulmonary disease, unspecified: Secondary | ICD-10-CM | POA: Diagnosis not present

## 2017-03-23 DIAGNOSIS — Z23 Encounter for immunization: Secondary | ICD-10-CM | POA: Diagnosis not present

## 2017-03-23 DIAGNOSIS — N186 End stage renal disease: Secondary | ICD-10-CM | POA: Diagnosis not present

## 2017-03-23 DIAGNOSIS — N2581 Secondary hyperparathyroidism of renal origin: Secondary | ICD-10-CM | POA: Diagnosis not present

## 2017-03-23 DIAGNOSIS — D509 Iron deficiency anemia, unspecified: Secondary | ICD-10-CM | POA: Diagnosis not present

## 2017-03-23 DIAGNOSIS — Z79899 Other long term (current) drug therapy: Secondary | ICD-10-CM | POA: Diagnosis not present

## 2017-03-23 DIAGNOSIS — R5381 Other malaise: Secondary | ICD-10-CM | POA: Diagnosis not present

## 2017-03-24 DIAGNOSIS — N186 End stage renal disease: Secondary | ICD-10-CM | POA: Diagnosis not present

## 2017-03-24 DIAGNOSIS — Z23 Encounter for immunization: Secondary | ICD-10-CM | POA: Diagnosis not present

## 2017-03-24 DIAGNOSIS — D509 Iron deficiency anemia, unspecified: Secondary | ICD-10-CM | POA: Diagnosis not present

## 2017-03-24 DIAGNOSIS — Z79899 Other long term (current) drug therapy: Secondary | ICD-10-CM | POA: Diagnosis not present

## 2017-03-24 DIAGNOSIS — N2581 Secondary hyperparathyroidism of renal origin: Secondary | ICD-10-CM | POA: Diagnosis not present

## 2017-03-24 DIAGNOSIS — R5381 Other malaise: Secondary | ICD-10-CM | POA: Diagnosis not present

## 2017-03-26 DIAGNOSIS — Z23 Encounter for immunization: Secondary | ICD-10-CM | POA: Diagnosis not present

## 2017-03-26 DIAGNOSIS — N186 End stage renal disease: Secondary | ICD-10-CM | POA: Diagnosis not present

## 2017-03-26 DIAGNOSIS — Z79899 Other long term (current) drug therapy: Secondary | ICD-10-CM | POA: Diagnosis not present

## 2017-03-26 DIAGNOSIS — D509 Iron deficiency anemia, unspecified: Secondary | ICD-10-CM | POA: Diagnosis not present

## 2017-03-26 DIAGNOSIS — N2581 Secondary hyperparathyroidism of renal origin: Secondary | ICD-10-CM | POA: Diagnosis not present

## 2017-03-26 DIAGNOSIS — R5381 Other malaise: Secondary | ICD-10-CM | POA: Diagnosis not present

## 2017-03-27 DIAGNOSIS — N186 End stage renal disease: Secondary | ICD-10-CM | POA: Diagnosis not present

## 2017-03-27 DIAGNOSIS — R5381 Other malaise: Secondary | ICD-10-CM | POA: Diagnosis not present

## 2017-03-27 DIAGNOSIS — Z23 Encounter for immunization: Secondary | ICD-10-CM | POA: Diagnosis not present

## 2017-03-27 DIAGNOSIS — D509 Iron deficiency anemia, unspecified: Secondary | ICD-10-CM | POA: Diagnosis not present

## 2017-03-27 DIAGNOSIS — N2581 Secondary hyperparathyroidism of renal origin: Secondary | ICD-10-CM | POA: Diagnosis not present

## 2017-03-27 DIAGNOSIS — Z79899 Other long term (current) drug therapy: Secondary | ICD-10-CM | POA: Diagnosis not present

## 2017-03-29 DIAGNOSIS — R5381 Other malaise: Secondary | ICD-10-CM | POA: Diagnosis not present

## 2017-03-29 DIAGNOSIS — N186 End stage renal disease: Secondary | ICD-10-CM | POA: Diagnosis not present

## 2017-03-29 DIAGNOSIS — Z79899 Other long term (current) drug therapy: Secondary | ICD-10-CM | POA: Diagnosis not present

## 2017-03-29 DIAGNOSIS — Z23 Encounter for immunization: Secondary | ICD-10-CM | POA: Diagnosis not present

## 2017-03-29 DIAGNOSIS — D509 Iron deficiency anemia, unspecified: Secondary | ICD-10-CM | POA: Diagnosis not present

## 2017-03-29 DIAGNOSIS — N2581 Secondary hyperparathyroidism of renal origin: Secondary | ICD-10-CM | POA: Diagnosis not present

## 2017-03-30 DIAGNOSIS — R5381 Other malaise: Secondary | ICD-10-CM | POA: Diagnosis not present

## 2017-03-30 DIAGNOSIS — Z23 Encounter for immunization: Secondary | ICD-10-CM | POA: Diagnosis not present

## 2017-03-30 DIAGNOSIS — N2581 Secondary hyperparathyroidism of renal origin: Secondary | ICD-10-CM | POA: Diagnosis not present

## 2017-03-30 DIAGNOSIS — D509 Iron deficiency anemia, unspecified: Secondary | ICD-10-CM | POA: Diagnosis not present

## 2017-03-30 DIAGNOSIS — Z79899 Other long term (current) drug therapy: Secondary | ICD-10-CM | POA: Diagnosis not present

## 2017-03-30 DIAGNOSIS — N186 End stage renal disease: Secondary | ICD-10-CM | POA: Diagnosis not present

## 2017-04-01 DIAGNOSIS — Z23 Encounter for immunization: Secondary | ICD-10-CM | POA: Diagnosis not present

## 2017-04-01 DIAGNOSIS — N2581 Secondary hyperparathyroidism of renal origin: Secondary | ICD-10-CM | POA: Diagnosis not present

## 2017-04-01 DIAGNOSIS — Z79899 Other long term (current) drug therapy: Secondary | ICD-10-CM | POA: Diagnosis not present

## 2017-04-01 DIAGNOSIS — R5381 Other malaise: Secondary | ICD-10-CM | POA: Diagnosis not present

## 2017-04-01 DIAGNOSIS — D509 Iron deficiency anemia, unspecified: Secondary | ICD-10-CM | POA: Diagnosis not present

## 2017-04-01 DIAGNOSIS — N186 End stage renal disease: Secondary | ICD-10-CM | POA: Diagnosis not present

## 2017-04-02 DIAGNOSIS — N2581 Secondary hyperparathyroidism of renal origin: Secondary | ICD-10-CM | POA: Diagnosis not present

## 2017-04-02 DIAGNOSIS — D509 Iron deficiency anemia, unspecified: Secondary | ICD-10-CM | POA: Diagnosis not present

## 2017-04-02 DIAGNOSIS — Z23 Encounter for immunization: Secondary | ICD-10-CM | POA: Diagnosis not present

## 2017-04-02 DIAGNOSIS — N186 End stage renal disease: Secondary | ICD-10-CM | POA: Diagnosis not present

## 2017-04-02 DIAGNOSIS — R5381 Other malaise: Secondary | ICD-10-CM | POA: Diagnosis not present

## 2017-04-02 DIAGNOSIS — Z79899 Other long term (current) drug therapy: Secondary | ICD-10-CM | POA: Diagnosis not present

## 2017-04-04 DIAGNOSIS — N2581 Secondary hyperparathyroidism of renal origin: Secondary | ICD-10-CM | POA: Diagnosis not present

## 2017-04-04 DIAGNOSIS — N186 End stage renal disease: Secondary | ICD-10-CM | POA: Diagnosis not present

## 2017-04-04 DIAGNOSIS — R5381 Other malaise: Secondary | ICD-10-CM | POA: Diagnosis not present

## 2017-04-04 DIAGNOSIS — D509 Iron deficiency anemia, unspecified: Secondary | ICD-10-CM | POA: Diagnosis not present

## 2017-04-04 DIAGNOSIS — Z23 Encounter for immunization: Secondary | ICD-10-CM | POA: Diagnosis not present

## 2017-04-04 DIAGNOSIS — Z79899 Other long term (current) drug therapy: Secondary | ICD-10-CM | POA: Diagnosis not present

## 2017-04-05 DIAGNOSIS — Z79899 Other long term (current) drug therapy: Secondary | ICD-10-CM | POA: Diagnosis not present

## 2017-04-05 DIAGNOSIS — D509 Iron deficiency anemia, unspecified: Secondary | ICD-10-CM | POA: Diagnosis not present

## 2017-04-05 DIAGNOSIS — Z23 Encounter for immunization: Secondary | ICD-10-CM | POA: Diagnosis not present

## 2017-04-05 DIAGNOSIS — N186 End stage renal disease: Secondary | ICD-10-CM | POA: Diagnosis not present

## 2017-04-05 DIAGNOSIS — N2581 Secondary hyperparathyroidism of renal origin: Secondary | ICD-10-CM | POA: Diagnosis not present

## 2017-04-05 DIAGNOSIS — R5381 Other malaise: Secondary | ICD-10-CM | POA: Diagnosis not present

## 2017-04-07 DIAGNOSIS — D509 Iron deficiency anemia, unspecified: Secondary | ICD-10-CM | POA: Diagnosis not present

## 2017-04-07 DIAGNOSIS — R5381 Other malaise: Secondary | ICD-10-CM | POA: Diagnosis not present

## 2017-04-07 DIAGNOSIS — N186 End stage renal disease: Secondary | ICD-10-CM | POA: Diagnosis not present

## 2017-04-07 DIAGNOSIS — Z79899 Other long term (current) drug therapy: Secondary | ICD-10-CM | POA: Diagnosis not present

## 2017-04-07 DIAGNOSIS — Z23 Encounter for immunization: Secondary | ICD-10-CM | POA: Diagnosis not present

## 2017-04-07 DIAGNOSIS — N2581 Secondary hyperparathyroidism of renal origin: Secondary | ICD-10-CM | POA: Diagnosis not present

## 2017-04-08 DIAGNOSIS — N186 End stage renal disease: Secondary | ICD-10-CM | POA: Diagnosis not present

## 2017-04-08 DIAGNOSIS — Z992 Dependence on renal dialysis: Secondary | ICD-10-CM | POA: Diagnosis not present

## 2017-04-08 DIAGNOSIS — N059 Unspecified nephritic syndrome with unspecified morphologic changes: Secondary | ICD-10-CM | POA: Diagnosis not present

## 2017-04-10 DIAGNOSIS — Z79899 Other long term (current) drug therapy: Secondary | ICD-10-CM | POA: Diagnosis not present

## 2017-04-10 DIAGNOSIS — D509 Iron deficiency anemia, unspecified: Secondary | ICD-10-CM | POA: Diagnosis not present

## 2017-04-10 DIAGNOSIS — R5381 Other malaise: Secondary | ICD-10-CM | POA: Diagnosis not present

## 2017-04-10 DIAGNOSIS — Z4931 Encounter for adequacy testing for hemodialysis: Secondary | ICD-10-CM | POA: Diagnosis not present

## 2017-04-10 DIAGNOSIS — N186 End stage renal disease: Secondary | ICD-10-CM | POA: Diagnosis not present

## 2017-04-10 DIAGNOSIS — N2581 Secondary hyperparathyroidism of renal origin: Secondary | ICD-10-CM | POA: Diagnosis not present

## 2017-04-11 DIAGNOSIS — R5381 Other malaise: Secondary | ICD-10-CM | POA: Diagnosis not present

## 2017-04-11 DIAGNOSIS — Z4931 Encounter for adequacy testing for hemodialysis: Secondary | ICD-10-CM | POA: Diagnosis not present

## 2017-04-11 DIAGNOSIS — N186 End stage renal disease: Secondary | ICD-10-CM | POA: Diagnosis not present

## 2017-04-11 DIAGNOSIS — D509 Iron deficiency anemia, unspecified: Secondary | ICD-10-CM | POA: Diagnosis not present

## 2017-04-11 DIAGNOSIS — Z79899 Other long term (current) drug therapy: Secondary | ICD-10-CM | POA: Diagnosis not present

## 2017-04-12 DIAGNOSIS — N186 End stage renal disease: Secondary | ICD-10-CM | POA: Diagnosis not present

## 2017-04-12 DIAGNOSIS — Z79899 Other long term (current) drug therapy: Secondary | ICD-10-CM | POA: Diagnosis not present

## 2017-04-12 DIAGNOSIS — D509 Iron deficiency anemia, unspecified: Secondary | ICD-10-CM | POA: Diagnosis not present

## 2017-04-12 DIAGNOSIS — Z4931 Encounter for adequacy testing for hemodialysis: Secondary | ICD-10-CM | POA: Diagnosis not present

## 2017-04-12 DIAGNOSIS — R5381 Other malaise: Secondary | ICD-10-CM | POA: Diagnosis not present

## 2017-04-13 DIAGNOSIS — Z4931 Encounter for adequacy testing for hemodialysis: Secondary | ICD-10-CM | POA: Diagnosis not present

## 2017-04-13 DIAGNOSIS — Z79899 Other long term (current) drug therapy: Secondary | ICD-10-CM | POA: Diagnosis not present

## 2017-04-13 DIAGNOSIS — N186 End stage renal disease: Secondary | ICD-10-CM | POA: Diagnosis not present

## 2017-04-13 DIAGNOSIS — R5381 Other malaise: Secondary | ICD-10-CM | POA: Diagnosis not present

## 2017-04-13 DIAGNOSIS — D509 Iron deficiency anemia, unspecified: Secondary | ICD-10-CM | POA: Diagnosis not present

## 2017-04-14 DIAGNOSIS — R5381 Other malaise: Secondary | ICD-10-CM | POA: Diagnosis not present

## 2017-04-14 DIAGNOSIS — D509 Iron deficiency anemia, unspecified: Secondary | ICD-10-CM | POA: Diagnosis not present

## 2017-04-14 DIAGNOSIS — Z4931 Encounter for adequacy testing for hemodialysis: Secondary | ICD-10-CM | POA: Diagnosis not present

## 2017-04-14 DIAGNOSIS — N186 End stage renal disease: Secondary | ICD-10-CM | POA: Diagnosis not present

## 2017-04-14 DIAGNOSIS — Z79899 Other long term (current) drug therapy: Secondary | ICD-10-CM | POA: Diagnosis not present

## 2017-04-16 DIAGNOSIS — D509 Iron deficiency anemia, unspecified: Secondary | ICD-10-CM | POA: Diagnosis not present

## 2017-04-16 DIAGNOSIS — Z79899 Other long term (current) drug therapy: Secondary | ICD-10-CM | POA: Diagnosis not present

## 2017-04-16 DIAGNOSIS — N186 End stage renal disease: Secondary | ICD-10-CM | POA: Diagnosis not present

## 2017-04-16 DIAGNOSIS — R5381 Other malaise: Secondary | ICD-10-CM | POA: Diagnosis not present

## 2017-04-16 DIAGNOSIS — Z4931 Encounter for adequacy testing for hemodialysis: Secondary | ICD-10-CM | POA: Diagnosis not present

## 2017-04-17 DIAGNOSIS — R5381 Other malaise: Secondary | ICD-10-CM | POA: Diagnosis not present

## 2017-04-17 DIAGNOSIS — D509 Iron deficiency anemia, unspecified: Secondary | ICD-10-CM | POA: Diagnosis not present

## 2017-04-17 DIAGNOSIS — Z4931 Encounter for adequacy testing for hemodialysis: Secondary | ICD-10-CM | POA: Diagnosis not present

## 2017-04-17 DIAGNOSIS — N186 End stage renal disease: Secondary | ICD-10-CM | POA: Diagnosis not present

## 2017-04-17 DIAGNOSIS — Z79899 Other long term (current) drug therapy: Secondary | ICD-10-CM | POA: Diagnosis not present

## 2017-04-18 DIAGNOSIS — J309 Allergic rhinitis, unspecified: Secondary | ICD-10-CM | POA: Diagnosis not present

## 2017-04-18 DIAGNOSIS — I82409 Acute embolism and thrombosis of unspecified deep veins of unspecified lower extremity: Secondary | ICD-10-CM | POA: Diagnosis not present

## 2017-04-18 DIAGNOSIS — M25562 Pain in left knee: Secondary | ICD-10-CM | POA: Diagnosis not present

## 2017-04-18 DIAGNOSIS — J449 Chronic obstructive pulmonary disease, unspecified: Secondary | ICD-10-CM | POA: Diagnosis not present

## 2017-04-18 DIAGNOSIS — I10 Essential (primary) hypertension: Secondary | ICD-10-CM | POA: Diagnosis not present

## 2017-04-18 DIAGNOSIS — Z79899 Other long term (current) drug therapy: Secondary | ICD-10-CM | POA: Diagnosis not present

## 2017-04-18 DIAGNOSIS — K219 Gastro-esophageal reflux disease without esophagitis: Secondary | ICD-10-CM | POA: Diagnosis not present

## 2017-04-18 DIAGNOSIS — Z4931 Encounter for adequacy testing for hemodialysis: Secondary | ICD-10-CM | POA: Diagnosis not present

## 2017-04-18 DIAGNOSIS — E1121 Type 2 diabetes mellitus with diabetic nephropathy: Secondary | ICD-10-CM | POA: Diagnosis not present

## 2017-04-18 DIAGNOSIS — N186 End stage renal disease: Secondary | ICD-10-CM | POA: Diagnosis not present

## 2017-04-18 DIAGNOSIS — B0229 Other postherpetic nervous system involvement: Secondary | ICD-10-CM | POA: Diagnosis not present

## 2017-04-18 DIAGNOSIS — E785 Hyperlipidemia, unspecified: Secondary | ICD-10-CM | POA: Diagnosis not present

## 2017-04-18 DIAGNOSIS — R5381 Other malaise: Secondary | ICD-10-CM | POA: Diagnosis not present

## 2017-04-18 DIAGNOSIS — D509 Iron deficiency anemia, unspecified: Secondary | ICD-10-CM | POA: Diagnosis not present

## 2017-04-18 DIAGNOSIS — R197 Diarrhea, unspecified: Secondary | ICD-10-CM | POA: Diagnosis not present

## 2017-04-19 DIAGNOSIS — Z79899 Other long term (current) drug therapy: Secondary | ICD-10-CM | POA: Diagnosis not present

## 2017-04-19 DIAGNOSIS — D509 Iron deficiency anemia, unspecified: Secondary | ICD-10-CM | POA: Diagnosis not present

## 2017-04-19 DIAGNOSIS — R5381 Other malaise: Secondary | ICD-10-CM | POA: Diagnosis not present

## 2017-04-19 DIAGNOSIS — Z4931 Encounter for adequacy testing for hemodialysis: Secondary | ICD-10-CM | POA: Diagnosis not present

## 2017-04-19 DIAGNOSIS — N186 End stage renal disease: Secondary | ICD-10-CM | POA: Diagnosis not present

## 2017-04-20 DIAGNOSIS — Z79899 Other long term (current) drug therapy: Secondary | ICD-10-CM | POA: Diagnosis not present

## 2017-04-20 DIAGNOSIS — D509 Iron deficiency anemia, unspecified: Secondary | ICD-10-CM | POA: Diagnosis not present

## 2017-04-20 DIAGNOSIS — Z4931 Encounter for adequacy testing for hemodialysis: Secondary | ICD-10-CM | POA: Diagnosis not present

## 2017-04-20 DIAGNOSIS — N186 End stage renal disease: Secondary | ICD-10-CM | POA: Diagnosis not present

## 2017-04-20 DIAGNOSIS — R5381 Other malaise: Secondary | ICD-10-CM | POA: Diagnosis not present

## 2017-04-22 DIAGNOSIS — R5381 Other malaise: Secondary | ICD-10-CM | POA: Diagnosis not present

## 2017-04-22 DIAGNOSIS — D509 Iron deficiency anemia, unspecified: Secondary | ICD-10-CM | POA: Diagnosis not present

## 2017-04-22 DIAGNOSIS — Z4931 Encounter for adequacy testing for hemodialysis: Secondary | ICD-10-CM | POA: Diagnosis not present

## 2017-04-22 DIAGNOSIS — N186 End stage renal disease: Secondary | ICD-10-CM | POA: Diagnosis not present

## 2017-04-22 DIAGNOSIS — Z79899 Other long term (current) drug therapy: Secondary | ICD-10-CM | POA: Diagnosis not present

## 2017-04-23 DIAGNOSIS — Z79899 Other long term (current) drug therapy: Secondary | ICD-10-CM | POA: Diagnosis not present

## 2017-04-23 DIAGNOSIS — D509 Iron deficiency anemia, unspecified: Secondary | ICD-10-CM | POA: Diagnosis not present

## 2017-04-23 DIAGNOSIS — N186 End stage renal disease: Secondary | ICD-10-CM | POA: Diagnosis not present

## 2017-04-23 DIAGNOSIS — Z4931 Encounter for adequacy testing for hemodialysis: Secondary | ICD-10-CM | POA: Diagnosis not present

## 2017-04-23 DIAGNOSIS — R5381 Other malaise: Secondary | ICD-10-CM | POA: Diagnosis not present

## 2017-04-24 DIAGNOSIS — K219 Gastro-esophageal reflux disease without esophagitis: Secondary | ICD-10-CM | POA: Diagnosis not present

## 2017-04-24 DIAGNOSIS — J449 Chronic obstructive pulmonary disease, unspecified: Secondary | ICD-10-CM | POA: Diagnosis not present

## 2017-04-24 DIAGNOSIS — Z79811 Long term (current) use of aromatase inhibitors: Secondary | ICD-10-CM | POA: Diagnosis not present

## 2017-04-24 DIAGNOSIS — Z170 Estrogen receptor positive status [ER+]: Secondary | ICD-10-CM | POA: Diagnosis not present

## 2017-04-24 DIAGNOSIS — J309 Allergic rhinitis, unspecified: Secondary | ICD-10-CM | POA: Diagnosis not present

## 2017-04-24 DIAGNOSIS — I10 Essential (primary) hypertension: Secondary | ICD-10-CM | POA: Diagnosis not present

## 2017-04-24 DIAGNOSIS — E1121 Type 2 diabetes mellitus with diabetic nephropathy: Secondary | ICD-10-CM | POA: Diagnosis not present

## 2017-04-24 DIAGNOSIS — I82409 Acute embolism and thrombosis of unspecified deep veins of unspecified lower extremity: Secondary | ICD-10-CM | POA: Diagnosis not present

## 2017-04-24 DIAGNOSIS — R197 Diarrhea, unspecified: Secondary | ICD-10-CM | POA: Diagnosis not present

## 2017-04-24 DIAGNOSIS — M545 Low back pain: Secondary | ICD-10-CM | POA: Diagnosis not present

## 2017-04-24 DIAGNOSIS — N186 End stage renal disease: Secondary | ICD-10-CM | POA: Diagnosis not present

## 2017-04-24 DIAGNOSIS — C50919 Malignant neoplasm of unspecified site of unspecified female breast: Secondary | ICD-10-CM | POA: Diagnosis not present

## 2017-04-24 DIAGNOSIS — Z853 Personal history of malignant neoplasm of breast: Secondary | ICD-10-CM | POA: Diagnosis not present

## 2017-04-24 DIAGNOSIS — E785 Hyperlipidemia, unspecified: Secondary | ICD-10-CM | POA: Diagnosis not present

## 2017-04-25 DIAGNOSIS — N186 End stage renal disease: Secondary | ICD-10-CM | POA: Diagnosis not present

## 2017-04-25 DIAGNOSIS — D509 Iron deficiency anemia, unspecified: Secondary | ICD-10-CM | POA: Diagnosis not present

## 2017-04-25 DIAGNOSIS — R5381 Other malaise: Secondary | ICD-10-CM | POA: Diagnosis not present

## 2017-04-25 DIAGNOSIS — Z79899 Other long term (current) drug therapy: Secondary | ICD-10-CM | POA: Diagnosis not present

## 2017-04-25 DIAGNOSIS — Z4931 Encounter for adequacy testing for hemodialysis: Secondary | ICD-10-CM | POA: Diagnosis not present

## 2017-04-26 DIAGNOSIS — Z79899 Other long term (current) drug therapy: Secondary | ICD-10-CM | POA: Diagnosis not present

## 2017-04-26 DIAGNOSIS — D509 Iron deficiency anemia, unspecified: Secondary | ICD-10-CM | POA: Diagnosis not present

## 2017-04-26 DIAGNOSIS — N186 End stage renal disease: Secondary | ICD-10-CM | POA: Diagnosis not present

## 2017-04-26 DIAGNOSIS — R5381 Other malaise: Secondary | ICD-10-CM | POA: Diagnosis not present

## 2017-04-26 DIAGNOSIS — Z4931 Encounter for adequacy testing for hemodialysis: Secondary | ICD-10-CM | POA: Diagnosis not present

## 2017-04-28 DIAGNOSIS — N186 End stage renal disease: Secondary | ICD-10-CM | POA: Diagnosis not present

## 2017-04-28 DIAGNOSIS — Z79899 Other long term (current) drug therapy: Secondary | ICD-10-CM | POA: Diagnosis not present

## 2017-04-28 DIAGNOSIS — D509 Iron deficiency anemia, unspecified: Secondary | ICD-10-CM | POA: Diagnosis not present

## 2017-04-28 DIAGNOSIS — Z4931 Encounter for adequacy testing for hemodialysis: Secondary | ICD-10-CM | POA: Diagnosis not present

## 2017-04-28 DIAGNOSIS — R5381 Other malaise: Secondary | ICD-10-CM | POA: Diagnosis not present

## 2017-04-29 DIAGNOSIS — D509 Iron deficiency anemia, unspecified: Secondary | ICD-10-CM | POA: Diagnosis not present

## 2017-04-29 DIAGNOSIS — Z79899 Other long term (current) drug therapy: Secondary | ICD-10-CM | POA: Diagnosis not present

## 2017-04-29 DIAGNOSIS — N186 End stage renal disease: Secondary | ICD-10-CM | POA: Diagnosis not present

## 2017-04-29 DIAGNOSIS — Z4931 Encounter for adequacy testing for hemodialysis: Secondary | ICD-10-CM | POA: Diagnosis not present

## 2017-04-29 DIAGNOSIS — R5381 Other malaise: Secondary | ICD-10-CM | POA: Diagnosis not present

## 2017-04-30 DIAGNOSIS — Z86010 Personal history of colonic polyps: Secondary | ICD-10-CM | POA: Diagnosis not present

## 2017-04-30 DIAGNOSIS — K648 Other hemorrhoids: Secondary | ICD-10-CM | POA: Diagnosis not present

## 2017-04-30 DIAGNOSIS — D122 Benign neoplasm of ascending colon: Secondary | ICD-10-CM | POA: Diagnosis not present

## 2017-04-30 DIAGNOSIS — Z1211 Encounter for screening for malignant neoplasm of colon: Secondary | ICD-10-CM | POA: Diagnosis not present

## 2017-04-30 DIAGNOSIS — Z9889 Other specified postprocedural states: Secondary | ICD-10-CM | POA: Diagnosis not present

## 2017-04-30 DIAGNOSIS — K635 Polyp of colon: Secondary | ICD-10-CM | POA: Diagnosis not present

## 2017-04-30 DIAGNOSIS — K5730 Diverticulosis of large intestine without perforation or abscess without bleeding: Secondary | ICD-10-CM | POA: Diagnosis not present

## 2017-05-01 DIAGNOSIS — D509 Iron deficiency anemia, unspecified: Secondary | ICD-10-CM | POA: Diagnosis not present

## 2017-05-01 DIAGNOSIS — R5381 Other malaise: Secondary | ICD-10-CM | POA: Diagnosis not present

## 2017-05-01 DIAGNOSIS — Z79899 Other long term (current) drug therapy: Secondary | ICD-10-CM | POA: Diagnosis not present

## 2017-05-01 DIAGNOSIS — N186 End stage renal disease: Secondary | ICD-10-CM | POA: Diagnosis not present

## 2017-05-01 DIAGNOSIS — Z4931 Encounter for adequacy testing for hemodialysis: Secondary | ICD-10-CM | POA: Diagnosis not present

## 2017-05-02 ENCOUNTER — Ambulatory Visit (INDEPENDENT_AMBULATORY_CARE_PROVIDER_SITE_OTHER): Payer: Medicare Other | Admitting: Sports Medicine

## 2017-05-02 DIAGNOSIS — M79674 Pain in right toe(s): Secondary | ICD-10-CM

## 2017-05-02 DIAGNOSIS — M79675 Pain in left toe(s): Secondary | ICD-10-CM

## 2017-05-02 DIAGNOSIS — Z79899 Other long term (current) drug therapy: Secondary | ICD-10-CM | POA: Diagnosis not present

## 2017-05-02 DIAGNOSIS — B351 Tinea unguium: Secondary | ICD-10-CM | POA: Diagnosis not present

## 2017-05-02 DIAGNOSIS — E1140 Type 2 diabetes mellitus with diabetic neuropathy, unspecified: Secondary | ICD-10-CM | POA: Diagnosis not present

## 2017-05-02 DIAGNOSIS — Z4931 Encounter for adequacy testing for hemodialysis: Secondary | ICD-10-CM | POA: Diagnosis not present

## 2017-05-02 DIAGNOSIS — R5381 Other malaise: Secondary | ICD-10-CM | POA: Diagnosis not present

## 2017-05-02 DIAGNOSIS — D509 Iron deficiency anemia, unspecified: Secondary | ICD-10-CM | POA: Diagnosis not present

## 2017-05-02 DIAGNOSIS — N186 End stage renal disease: Secondary | ICD-10-CM | POA: Diagnosis not present

## 2017-05-02 NOTE — Progress Notes (Signed)
Subjective: Linda Jordan is a 83 y.o. female patient with history of diabetes who presents to office today complaining of long, painful nails  while ambulating in shoes; unable to trim. Patient states that the glucose readings are no longer recorded no longer on a lot of her medications. Patient provided updated medication list this visit. Patient denies any new changes in medication or new problems.  Patient Active Problem List   Diagnosis Date Noted  . Thyroid disease 05/28/2013  . Hypertension 05/28/2013  . Arthritis 02/16/2011  . Bilateral swelling of feet 02/16/2011  . Mycotic toenails 02/16/2011   Current Outpatient Prescriptions on File Prior to Visit  Medication Sig Dispense Refill  . Acetaminophen (TYLENOL EX ST ARTHRITIS PAIN PO) Take by mouth.      Marland Kitchen amLODipine (NORVASC) 10 MG tablet Take 10 mg by mouth daily.      Marland Kitchen aspirin EC 81 MG tablet Take 81 mg by mouth.    Marland Kitchen atorvastatin (LIPITOR) 80 MG tablet Take 80 mg by mouth.    Marland Kitchen b complex vitamins capsule Take by mouth.    Marland Kitchen buPROPion (WELLBUTRIN SR) 100 MG 12 hr tablet TAKE 1 TABLET (100 MG TOTAL) BY MOUTH DAILY.  4  . carvedilol (COREG) 3.125 MG tablet Take 3.125 mg by mouth.    . cetirizine (ZYRTEC) 10 MG tablet 1 (ONE) TABLET BY MOUTH AT BEDTIME  12  . hydrochlorothiazide (HYDRODIURIL) 25 MG tablet Take 25 mg by mouth daily.    Marland Kitchen letrozole (FEMARA) 2.5 MG tablet 1 TABLET ONCE DAILY - TAKE DAILY FOR HORMONAL PROTECTION AGAINST FUTURE BREAST CANCER  3  . levothyroxine (SYNTHROID) 25 MCG tablet Take by mouth.    . losartan (COZAAR) 50 MG tablet TAKE 1 TABLET BY MOUTH AT BEDTIME (STOP LISINOPRIL)  11  . metoprolol (LOPRESSOR) 50 MG tablet Take 50 mg by mouth 2 (two) times daily.    . Multiple Vitamin (MULTIVITAMIN) tablet Take 1 tablet by mouth daily.    . Nebivolol HCl (BYSTOLIC) 20 MG TABS Take by mouth.      . Omega-3 Fatty Acids (FISH OIL CONCENTRATE) 300 MG CAPS Take by mouth.    Marland Kitchen omeprazole (PRILOSEC) 40 MG capsule Take  40 mg by mouth daily.      Marland Kitchen oxycodone (OXY-IR) 5 MG capsule TAKE ONE TO TWO CAPSULES BY MOUTH EVERY 4 HOURS AS NEEDED FOR PAIN  0  . pregabalin (LYRICA) 75 MG capsule Take 75 mg by mouth 2 (two) times daily.      . rosuvastatin (CRESTOR) 10 MG tablet Take 10 mg by mouth daily.      Marland Kitchen spironolactone (ALDACTONE) 100 MG tablet Take 100 mg by mouth.    . warfarin (COUMADIN) 2 MG tablet Take 2 mg by mouth daily.       No current facility-administered medications on file prior to visit.    Allergies  Allergen Reactions  . Codeine Nausea And Vomiting  . Other   . Sulfa Antibiotics Nausea And Vomiting  . Tape     No results found for this or any previous visit (from the past 2160 hour(s)).  Objective: General: Patient is awake, alert, and oriented x 3 and in no acute distress.  Integument: Skin is warm, dry and supple bilateral. Nails are tender, long, thickened and dystrophic with subungual debris, consistent with onychomycosis, 1-5 bilateral. No signs of infection. No open lesions or preulcerative lesions present bilateral. Remaining integument unremarkable.  Vasculature:  Dorsalis Pedis pulse 1/4 bilateral. Posterior  Tibial pulse  1/4 bilateral.  Capillary fill time <3 sec 1-5 bilateral. Scant hair growth to the level of the digits. Temperature gradient within normal limits. Trace varicosities present bilateral. No edema present bilateral.   Neurology: The patient has absent sensation measured with a 5.07/10g Semmes Weinstein Monofilament at all pedal sites bilateral . Vibratory sensation absent bilateral with tuning fork. No Babinski sign present bilateral.   Musculoskeletal: Asymptomatic hammertoes pedal deformities noted bilateral. Muscular strength 5/5 in all lower extremity muscular groups bilateral without pain on range of motion . No tenderness with calf compression bilateral.  Assessment and Plan: Problem List Items Addressed This Visit    None    Visit Diagnoses    Pain due  to onychomycosis of toenails of both feet    -  Primary   Type 2 diabetes, controlled, with neuropathy ([place])         -Examined patient. -Mechanically debrided all nails 1-5 bilateral using sterile nail nipper and filed with dremel without incident -Gave new set of toe spacers and instructed on use  -Answered all patient questions -Patient to return  in 3 months for at risk foot care -Patient advised to call the office if any problems or questions arise in the meantime.  Landis Martins, DPM

## 2017-05-03 DIAGNOSIS — N186 End stage renal disease: Secondary | ICD-10-CM | POA: Diagnosis not present

## 2017-05-03 DIAGNOSIS — Z79899 Other long term (current) drug therapy: Secondary | ICD-10-CM | POA: Diagnosis not present

## 2017-05-03 DIAGNOSIS — D509 Iron deficiency anemia, unspecified: Secondary | ICD-10-CM | POA: Diagnosis not present

## 2017-05-03 DIAGNOSIS — R5381 Other malaise: Secondary | ICD-10-CM | POA: Diagnosis not present

## 2017-05-03 DIAGNOSIS — Z4931 Encounter for adequacy testing for hemodialysis: Secondary | ICD-10-CM | POA: Diagnosis not present

## 2017-05-04 DIAGNOSIS — D509 Iron deficiency anemia, unspecified: Secondary | ICD-10-CM | POA: Diagnosis not present

## 2017-05-04 DIAGNOSIS — R5381 Other malaise: Secondary | ICD-10-CM | POA: Diagnosis not present

## 2017-05-04 DIAGNOSIS — Z4931 Encounter for adequacy testing for hemodialysis: Secondary | ICD-10-CM | POA: Diagnosis not present

## 2017-05-04 DIAGNOSIS — N186 End stage renal disease: Secondary | ICD-10-CM | POA: Diagnosis not present

## 2017-05-04 DIAGNOSIS — Z79899 Other long term (current) drug therapy: Secondary | ICD-10-CM | POA: Diagnosis not present

## 2017-05-05 DIAGNOSIS — D509 Iron deficiency anemia, unspecified: Secondary | ICD-10-CM | POA: Diagnosis not present

## 2017-05-05 DIAGNOSIS — R5381 Other malaise: Secondary | ICD-10-CM | POA: Diagnosis not present

## 2017-05-05 DIAGNOSIS — Z4931 Encounter for adequacy testing for hemodialysis: Secondary | ICD-10-CM | POA: Diagnosis not present

## 2017-05-05 DIAGNOSIS — Z79899 Other long term (current) drug therapy: Secondary | ICD-10-CM | POA: Diagnosis not present

## 2017-05-05 DIAGNOSIS — N186 End stage renal disease: Secondary | ICD-10-CM | POA: Diagnosis not present

## 2017-05-07 DIAGNOSIS — D509 Iron deficiency anemia, unspecified: Secondary | ICD-10-CM | POA: Diagnosis not present

## 2017-05-07 DIAGNOSIS — Z79899 Other long term (current) drug therapy: Secondary | ICD-10-CM | POA: Diagnosis not present

## 2017-05-07 DIAGNOSIS — Z4931 Encounter for adequacy testing for hemodialysis: Secondary | ICD-10-CM | POA: Diagnosis not present

## 2017-05-07 DIAGNOSIS — N186 End stage renal disease: Secondary | ICD-10-CM | POA: Diagnosis not present

## 2017-05-07 DIAGNOSIS — R5381 Other malaise: Secondary | ICD-10-CM | POA: Diagnosis not present

## 2017-05-08 DIAGNOSIS — Z4931 Encounter for adequacy testing for hemodialysis: Secondary | ICD-10-CM | POA: Diagnosis not present

## 2017-05-08 DIAGNOSIS — N186 End stage renal disease: Secondary | ICD-10-CM | POA: Diagnosis not present

## 2017-05-08 DIAGNOSIS — Z79899 Other long term (current) drug therapy: Secondary | ICD-10-CM | POA: Diagnosis not present

## 2017-05-08 DIAGNOSIS — R5381 Other malaise: Secondary | ICD-10-CM | POA: Diagnosis not present

## 2017-05-08 DIAGNOSIS — D509 Iron deficiency anemia, unspecified: Secondary | ICD-10-CM | POA: Diagnosis not present

## 2017-05-09 DIAGNOSIS — Z992 Dependence on renal dialysis: Secondary | ICD-10-CM | POA: Diagnosis not present

## 2017-05-09 DIAGNOSIS — N186 End stage renal disease: Secondary | ICD-10-CM | POA: Diagnosis not present

## 2017-05-09 DIAGNOSIS — N059 Unspecified nephritic syndrome with unspecified morphologic changes: Secondary | ICD-10-CM | POA: Diagnosis not present

## 2017-05-10 DIAGNOSIS — Z79899 Other long term (current) drug therapy: Secondary | ICD-10-CM | POA: Diagnosis not present

## 2017-05-10 DIAGNOSIS — D509 Iron deficiency anemia, unspecified: Secondary | ICD-10-CM | POA: Diagnosis not present

## 2017-05-10 DIAGNOSIS — N186 End stage renal disease: Secondary | ICD-10-CM | POA: Diagnosis not present

## 2017-05-10 DIAGNOSIS — N2581 Secondary hyperparathyroidism of renal origin: Secondary | ICD-10-CM | POA: Diagnosis not present

## 2017-05-11 DIAGNOSIS — N2581 Secondary hyperparathyroidism of renal origin: Secondary | ICD-10-CM | POA: Diagnosis not present

## 2017-05-11 DIAGNOSIS — N186 End stage renal disease: Secondary | ICD-10-CM | POA: Diagnosis not present

## 2017-05-11 DIAGNOSIS — Z79899 Other long term (current) drug therapy: Secondary | ICD-10-CM | POA: Diagnosis not present

## 2017-05-11 DIAGNOSIS — D509 Iron deficiency anemia, unspecified: Secondary | ICD-10-CM | POA: Diagnosis not present

## 2017-05-13 DIAGNOSIS — N2581 Secondary hyperparathyroidism of renal origin: Secondary | ICD-10-CM | POA: Diagnosis not present

## 2017-05-13 DIAGNOSIS — N186 End stage renal disease: Secondary | ICD-10-CM | POA: Diagnosis not present

## 2017-05-13 DIAGNOSIS — Z79899 Other long term (current) drug therapy: Secondary | ICD-10-CM | POA: Diagnosis not present

## 2017-05-13 DIAGNOSIS — D509 Iron deficiency anemia, unspecified: Secondary | ICD-10-CM | POA: Diagnosis not present

## 2017-05-14 DIAGNOSIS — D509 Iron deficiency anemia, unspecified: Secondary | ICD-10-CM | POA: Diagnosis not present

## 2017-05-14 DIAGNOSIS — N186 End stage renal disease: Secondary | ICD-10-CM | POA: Diagnosis not present

## 2017-05-14 DIAGNOSIS — N2581 Secondary hyperparathyroidism of renal origin: Secondary | ICD-10-CM | POA: Diagnosis not present

## 2017-05-14 DIAGNOSIS — Z79899 Other long term (current) drug therapy: Secondary | ICD-10-CM | POA: Diagnosis not present

## 2017-05-16 DIAGNOSIS — N2581 Secondary hyperparathyroidism of renal origin: Secondary | ICD-10-CM | POA: Diagnosis not present

## 2017-05-16 DIAGNOSIS — Z79899 Other long term (current) drug therapy: Secondary | ICD-10-CM | POA: Diagnosis not present

## 2017-05-16 DIAGNOSIS — N186 End stage renal disease: Secondary | ICD-10-CM | POA: Diagnosis not present

## 2017-05-16 DIAGNOSIS — D509 Iron deficiency anemia, unspecified: Secondary | ICD-10-CM | POA: Diagnosis not present

## 2017-05-17 DIAGNOSIS — D509 Iron deficiency anemia, unspecified: Secondary | ICD-10-CM | POA: Diagnosis not present

## 2017-05-17 DIAGNOSIS — Z79899 Other long term (current) drug therapy: Secondary | ICD-10-CM | POA: Diagnosis not present

## 2017-05-17 DIAGNOSIS — N186 End stage renal disease: Secondary | ICD-10-CM | POA: Diagnosis not present

## 2017-05-17 DIAGNOSIS — N2581 Secondary hyperparathyroidism of renal origin: Secondary | ICD-10-CM | POA: Diagnosis not present

## 2017-05-19 DIAGNOSIS — N2581 Secondary hyperparathyroidism of renal origin: Secondary | ICD-10-CM | POA: Diagnosis not present

## 2017-05-19 DIAGNOSIS — D509 Iron deficiency anemia, unspecified: Secondary | ICD-10-CM | POA: Diagnosis not present

## 2017-05-19 DIAGNOSIS — N186 End stage renal disease: Secondary | ICD-10-CM | POA: Diagnosis not present

## 2017-05-19 DIAGNOSIS — Z79899 Other long term (current) drug therapy: Secondary | ICD-10-CM | POA: Diagnosis not present

## 2017-05-20 DIAGNOSIS — N2581 Secondary hyperparathyroidism of renal origin: Secondary | ICD-10-CM | POA: Diagnosis not present

## 2017-05-20 DIAGNOSIS — N186 End stage renal disease: Secondary | ICD-10-CM | POA: Diagnosis not present

## 2017-05-20 DIAGNOSIS — Z79899 Other long term (current) drug therapy: Secondary | ICD-10-CM | POA: Diagnosis not present

## 2017-05-20 DIAGNOSIS — D509 Iron deficiency anemia, unspecified: Secondary | ICD-10-CM | POA: Diagnosis not present

## 2017-05-22 DIAGNOSIS — D509 Iron deficiency anemia, unspecified: Secondary | ICD-10-CM | POA: Diagnosis not present

## 2017-05-22 DIAGNOSIS — N2581 Secondary hyperparathyroidism of renal origin: Secondary | ICD-10-CM | POA: Diagnosis not present

## 2017-05-22 DIAGNOSIS — Z79899 Other long term (current) drug therapy: Secondary | ICD-10-CM | POA: Diagnosis not present

## 2017-05-22 DIAGNOSIS — N186 End stage renal disease: Secondary | ICD-10-CM | POA: Diagnosis not present

## 2017-05-24 DIAGNOSIS — J309 Allergic rhinitis, unspecified: Secondary | ICD-10-CM | POA: Diagnosis not present

## 2017-05-24 DIAGNOSIS — Z79899 Other long term (current) drug therapy: Secondary | ICD-10-CM | POA: Diagnosis not present

## 2017-05-24 DIAGNOSIS — B0229 Other postherpetic nervous system involvement: Secondary | ICD-10-CM | POA: Diagnosis not present

## 2017-05-24 DIAGNOSIS — E785 Hyperlipidemia, unspecified: Secondary | ICD-10-CM | POA: Diagnosis not present

## 2017-05-24 DIAGNOSIS — K219 Gastro-esophageal reflux disease without esophagitis: Secondary | ICD-10-CM | POA: Diagnosis not present

## 2017-05-24 DIAGNOSIS — D509 Iron deficiency anemia, unspecified: Secondary | ICD-10-CM | POA: Diagnosis not present

## 2017-05-24 DIAGNOSIS — J449 Chronic obstructive pulmonary disease, unspecified: Secondary | ICD-10-CM | POA: Diagnosis not present

## 2017-05-24 DIAGNOSIS — E1121 Type 2 diabetes mellitus with diabetic nephropathy: Secondary | ICD-10-CM | POA: Diagnosis not present

## 2017-05-24 DIAGNOSIS — R600 Localized edema: Secondary | ICD-10-CM | POA: Diagnosis not present

## 2017-05-24 DIAGNOSIS — I10 Essential (primary) hypertension: Secondary | ICD-10-CM | POA: Diagnosis not present

## 2017-05-24 DIAGNOSIS — I82409 Acute embolism and thrombosis of unspecified deep veins of unspecified lower extremity: Secondary | ICD-10-CM | POA: Diagnosis not present

## 2017-05-24 DIAGNOSIS — N2581 Secondary hyperparathyroidism of renal origin: Secondary | ICD-10-CM | POA: Diagnosis not present

## 2017-05-24 DIAGNOSIS — M545 Low back pain: Secondary | ICD-10-CM | POA: Diagnosis not present

## 2017-05-24 DIAGNOSIS — R197 Diarrhea, unspecified: Secondary | ICD-10-CM | POA: Diagnosis not present

## 2017-05-24 DIAGNOSIS — N186 End stage renal disease: Secondary | ICD-10-CM | POA: Diagnosis not present

## 2017-05-29 DIAGNOSIS — Z79899 Other long term (current) drug therapy: Secondary | ICD-10-CM | POA: Diagnosis not present

## 2017-05-29 DIAGNOSIS — D509 Iron deficiency anemia, unspecified: Secondary | ICD-10-CM | POA: Diagnosis not present

## 2017-05-29 DIAGNOSIS — N186 End stage renal disease: Secondary | ICD-10-CM | POA: Diagnosis not present

## 2017-05-29 DIAGNOSIS — N2581 Secondary hyperparathyroidism of renal origin: Secondary | ICD-10-CM | POA: Diagnosis not present

## 2017-05-31 DIAGNOSIS — N186 End stage renal disease: Secondary | ICD-10-CM | POA: Diagnosis not present

## 2017-05-31 DIAGNOSIS — D509 Iron deficiency anemia, unspecified: Secondary | ICD-10-CM | POA: Diagnosis not present

## 2017-05-31 DIAGNOSIS — N2581 Secondary hyperparathyroidism of renal origin: Secondary | ICD-10-CM | POA: Diagnosis not present

## 2017-05-31 DIAGNOSIS — Z79899 Other long term (current) drug therapy: Secondary | ICD-10-CM | POA: Diagnosis not present

## 2017-06-01 DIAGNOSIS — N186 End stage renal disease: Secondary | ICD-10-CM | POA: Diagnosis not present

## 2017-06-01 DIAGNOSIS — N2581 Secondary hyperparathyroidism of renal origin: Secondary | ICD-10-CM | POA: Diagnosis not present

## 2017-06-01 DIAGNOSIS — D509 Iron deficiency anemia, unspecified: Secondary | ICD-10-CM | POA: Diagnosis not present

## 2017-06-01 DIAGNOSIS — Z79899 Other long term (current) drug therapy: Secondary | ICD-10-CM | POA: Diagnosis not present

## 2017-06-03 DIAGNOSIS — Z79899 Other long term (current) drug therapy: Secondary | ICD-10-CM | POA: Diagnosis not present

## 2017-06-03 DIAGNOSIS — D509 Iron deficiency anemia, unspecified: Secondary | ICD-10-CM | POA: Diagnosis not present

## 2017-06-03 DIAGNOSIS — N2581 Secondary hyperparathyroidism of renal origin: Secondary | ICD-10-CM | POA: Diagnosis not present

## 2017-06-03 DIAGNOSIS — N186 End stage renal disease: Secondary | ICD-10-CM | POA: Diagnosis not present

## 2017-06-05 DIAGNOSIS — Z79899 Other long term (current) drug therapy: Secondary | ICD-10-CM | POA: Diagnosis not present

## 2017-06-05 DIAGNOSIS — N2581 Secondary hyperparathyroidism of renal origin: Secondary | ICD-10-CM | POA: Diagnosis not present

## 2017-06-05 DIAGNOSIS — D509 Iron deficiency anemia, unspecified: Secondary | ICD-10-CM | POA: Diagnosis not present

## 2017-06-05 DIAGNOSIS — N186 End stage renal disease: Secondary | ICD-10-CM | POA: Diagnosis not present

## 2017-06-07 DIAGNOSIS — N2581 Secondary hyperparathyroidism of renal origin: Secondary | ICD-10-CM | POA: Diagnosis not present

## 2017-06-07 DIAGNOSIS — D509 Iron deficiency anemia, unspecified: Secondary | ICD-10-CM | POA: Diagnosis not present

## 2017-06-07 DIAGNOSIS — Z79899 Other long term (current) drug therapy: Secondary | ICD-10-CM | POA: Diagnosis not present

## 2017-06-07 DIAGNOSIS — N186 End stage renal disease: Secondary | ICD-10-CM | POA: Diagnosis not present

## 2017-06-08 DIAGNOSIS — N059 Unspecified nephritic syndrome with unspecified morphologic changes: Secondary | ICD-10-CM | POA: Diagnosis not present

## 2017-06-08 DIAGNOSIS — Z992 Dependence on renal dialysis: Secondary | ICD-10-CM | POA: Diagnosis not present

## 2017-06-08 DIAGNOSIS — N186 End stage renal disease: Secondary | ICD-10-CM | POA: Diagnosis not present

## 2017-06-09 DIAGNOSIS — Z79899 Other long term (current) drug therapy: Secondary | ICD-10-CM | POA: Diagnosis not present

## 2017-06-09 DIAGNOSIS — D509 Iron deficiency anemia, unspecified: Secondary | ICD-10-CM | POA: Diagnosis not present

## 2017-06-09 DIAGNOSIS — N186 End stage renal disease: Secondary | ICD-10-CM | POA: Diagnosis not present

## 2017-06-09 DIAGNOSIS — N2581 Secondary hyperparathyroidism of renal origin: Secondary | ICD-10-CM | POA: Diagnosis not present

## 2017-06-09 DIAGNOSIS — K7689 Other specified diseases of liver: Secondary | ICD-10-CM | POA: Diagnosis not present

## 2017-06-09 DIAGNOSIS — D631 Anemia in chronic kidney disease: Secondary | ICD-10-CM | POA: Diagnosis not present

## 2017-06-11 DIAGNOSIS — Z79899 Other long term (current) drug therapy: Secondary | ICD-10-CM | POA: Diagnosis not present

## 2017-06-11 DIAGNOSIS — K7689 Other specified diseases of liver: Secondary | ICD-10-CM | POA: Diagnosis not present

## 2017-06-11 DIAGNOSIS — D631 Anemia in chronic kidney disease: Secondary | ICD-10-CM | POA: Diagnosis not present

## 2017-06-11 DIAGNOSIS — N2581 Secondary hyperparathyroidism of renal origin: Secondary | ICD-10-CM | POA: Diagnosis not present

## 2017-06-11 DIAGNOSIS — D509 Iron deficiency anemia, unspecified: Secondary | ICD-10-CM | POA: Diagnosis not present

## 2017-06-11 DIAGNOSIS — N186 End stage renal disease: Secondary | ICD-10-CM | POA: Diagnosis not present

## 2017-06-12 DIAGNOSIS — N186 End stage renal disease: Secondary | ICD-10-CM | POA: Diagnosis not present

## 2017-06-12 DIAGNOSIS — D509 Iron deficiency anemia, unspecified: Secondary | ICD-10-CM | POA: Diagnosis not present

## 2017-06-12 DIAGNOSIS — D631 Anemia in chronic kidney disease: Secondary | ICD-10-CM | POA: Diagnosis not present

## 2017-06-12 DIAGNOSIS — K7689 Other specified diseases of liver: Secondary | ICD-10-CM | POA: Diagnosis not present

## 2017-06-12 DIAGNOSIS — N2581 Secondary hyperparathyroidism of renal origin: Secondary | ICD-10-CM | POA: Diagnosis not present

## 2017-06-12 DIAGNOSIS — Z79899 Other long term (current) drug therapy: Secondary | ICD-10-CM | POA: Diagnosis not present

## 2017-06-14 DIAGNOSIS — N2581 Secondary hyperparathyroidism of renal origin: Secondary | ICD-10-CM | POA: Diagnosis not present

## 2017-06-14 DIAGNOSIS — D509 Iron deficiency anemia, unspecified: Secondary | ICD-10-CM | POA: Diagnosis not present

## 2017-06-14 DIAGNOSIS — K7689 Other specified diseases of liver: Secondary | ICD-10-CM | POA: Diagnosis not present

## 2017-06-14 DIAGNOSIS — Z79899 Other long term (current) drug therapy: Secondary | ICD-10-CM | POA: Diagnosis not present

## 2017-06-14 DIAGNOSIS — D631 Anemia in chronic kidney disease: Secondary | ICD-10-CM | POA: Diagnosis not present

## 2017-06-14 DIAGNOSIS — N186 End stage renal disease: Secondary | ICD-10-CM | POA: Diagnosis not present

## 2017-06-16 DIAGNOSIS — N2581 Secondary hyperparathyroidism of renal origin: Secondary | ICD-10-CM | POA: Diagnosis not present

## 2017-06-16 DIAGNOSIS — D509 Iron deficiency anemia, unspecified: Secondary | ICD-10-CM | POA: Diagnosis not present

## 2017-06-16 DIAGNOSIS — Z79899 Other long term (current) drug therapy: Secondary | ICD-10-CM | POA: Diagnosis not present

## 2017-06-16 DIAGNOSIS — N186 End stage renal disease: Secondary | ICD-10-CM | POA: Diagnosis not present

## 2017-06-16 DIAGNOSIS — K7689 Other specified diseases of liver: Secondary | ICD-10-CM | POA: Diagnosis not present

## 2017-06-16 DIAGNOSIS — D631 Anemia in chronic kidney disease: Secondary | ICD-10-CM | POA: Diagnosis not present

## 2017-06-18 DIAGNOSIS — Z79899 Other long term (current) drug therapy: Secondary | ICD-10-CM | POA: Diagnosis not present

## 2017-06-18 DIAGNOSIS — D631 Anemia in chronic kidney disease: Secondary | ICD-10-CM | POA: Diagnosis not present

## 2017-06-18 DIAGNOSIS — N2581 Secondary hyperparathyroidism of renal origin: Secondary | ICD-10-CM | POA: Diagnosis not present

## 2017-06-18 DIAGNOSIS — N186 End stage renal disease: Secondary | ICD-10-CM | POA: Diagnosis not present

## 2017-06-18 DIAGNOSIS — D509 Iron deficiency anemia, unspecified: Secondary | ICD-10-CM | POA: Diagnosis not present

## 2017-06-18 DIAGNOSIS — K7689 Other specified diseases of liver: Secondary | ICD-10-CM | POA: Diagnosis not present

## 2017-06-19 DIAGNOSIS — D631 Anemia in chronic kidney disease: Secondary | ICD-10-CM | POA: Diagnosis not present

## 2017-06-19 DIAGNOSIS — K7689 Other specified diseases of liver: Secondary | ICD-10-CM | POA: Diagnosis not present

## 2017-06-19 DIAGNOSIS — D509 Iron deficiency anemia, unspecified: Secondary | ICD-10-CM | POA: Diagnosis not present

## 2017-06-19 DIAGNOSIS — N186 End stage renal disease: Secondary | ICD-10-CM | POA: Diagnosis not present

## 2017-06-19 DIAGNOSIS — Z79899 Other long term (current) drug therapy: Secondary | ICD-10-CM | POA: Diagnosis not present

## 2017-06-19 DIAGNOSIS — N2581 Secondary hyperparathyroidism of renal origin: Secondary | ICD-10-CM | POA: Diagnosis not present

## 2017-06-20 DIAGNOSIS — M545 Low back pain: Secondary | ICD-10-CM | POA: Diagnosis not present

## 2017-06-20 DIAGNOSIS — R600 Localized edema: Secondary | ICD-10-CM | POA: Diagnosis not present

## 2017-06-20 DIAGNOSIS — J449 Chronic obstructive pulmonary disease, unspecified: Secondary | ICD-10-CM | POA: Diagnosis not present

## 2017-06-20 DIAGNOSIS — D0582 Other specified type of carcinoma in situ of left breast: Secondary | ICD-10-CM | POA: Diagnosis not present

## 2017-06-20 DIAGNOSIS — E1121 Type 2 diabetes mellitus with diabetic nephropathy: Secondary | ICD-10-CM | POA: Diagnosis not present

## 2017-06-20 DIAGNOSIS — B0229 Other postherpetic nervous system involvement: Secondary | ICD-10-CM | POA: Diagnosis not present

## 2017-06-20 DIAGNOSIS — I82409 Acute embolism and thrombosis of unspecified deep veins of unspecified lower extremity: Secondary | ICD-10-CM | POA: Diagnosis not present

## 2017-06-20 DIAGNOSIS — N186 End stage renal disease: Secondary | ICD-10-CM | POA: Diagnosis not present

## 2017-06-20 DIAGNOSIS — R197 Diarrhea, unspecified: Secondary | ICD-10-CM | POA: Diagnosis not present

## 2017-06-20 DIAGNOSIS — E039 Hypothyroidism, unspecified: Secondary | ICD-10-CM | POA: Diagnosis not present

## 2017-06-20 DIAGNOSIS — E785 Hyperlipidemia, unspecified: Secondary | ICD-10-CM | POA: Diagnosis not present

## 2017-06-20 DIAGNOSIS — K219 Gastro-esophageal reflux disease without esophagitis: Secondary | ICD-10-CM | POA: Diagnosis not present

## 2017-06-21 DIAGNOSIS — K7689 Other specified diseases of liver: Secondary | ICD-10-CM | POA: Diagnosis not present

## 2017-06-21 DIAGNOSIS — N2581 Secondary hyperparathyroidism of renal origin: Secondary | ICD-10-CM | POA: Diagnosis not present

## 2017-06-21 DIAGNOSIS — Z79899 Other long term (current) drug therapy: Secondary | ICD-10-CM | POA: Diagnosis not present

## 2017-06-21 DIAGNOSIS — N186 End stage renal disease: Secondary | ICD-10-CM | POA: Diagnosis not present

## 2017-06-21 DIAGNOSIS — D631 Anemia in chronic kidney disease: Secondary | ICD-10-CM | POA: Diagnosis not present

## 2017-06-21 DIAGNOSIS — D509 Iron deficiency anemia, unspecified: Secondary | ICD-10-CM | POA: Diagnosis not present

## 2017-06-23 DIAGNOSIS — N2581 Secondary hyperparathyroidism of renal origin: Secondary | ICD-10-CM | POA: Diagnosis not present

## 2017-06-23 DIAGNOSIS — D631 Anemia in chronic kidney disease: Secondary | ICD-10-CM | POA: Diagnosis not present

## 2017-06-23 DIAGNOSIS — K7689 Other specified diseases of liver: Secondary | ICD-10-CM | POA: Diagnosis not present

## 2017-06-23 DIAGNOSIS — Z79899 Other long term (current) drug therapy: Secondary | ICD-10-CM | POA: Diagnosis not present

## 2017-06-23 DIAGNOSIS — D509 Iron deficiency anemia, unspecified: Secondary | ICD-10-CM | POA: Diagnosis not present

## 2017-06-23 DIAGNOSIS — N186 End stage renal disease: Secondary | ICD-10-CM | POA: Diagnosis not present

## 2017-06-25 DIAGNOSIS — Z79899 Other long term (current) drug therapy: Secondary | ICD-10-CM | POA: Diagnosis not present

## 2017-06-25 DIAGNOSIS — D509 Iron deficiency anemia, unspecified: Secondary | ICD-10-CM | POA: Diagnosis not present

## 2017-06-25 DIAGNOSIS — D631 Anemia in chronic kidney disease: Secondary | ICD-10-CM | POA: Diagnosis not present

## 2017-06-25 DIAGNOSIS — K7689 Other specified diseases of liver: Secondary | ICD-10-CM | POA: Diagnosis not present

## 2017-06-25 DIAGNOSIS — N186 End stage renal disease: Secondary | ICD-10-CM | POA: Diagnosis not present

## 2017-06-25 DIAGNOSIS — N2581 Secondary hyperparathyroidism of renal origin: Secondary | ICD-10-CM | POA: Diagnosis not present

## 2017-06-26 DIAGNOSIS — K7689 Other specified diseases of liver: Secondary | ICD-10-CM | POA: Diagnosis not present

## 2017-06-26 DIAGNOSIS — D509 Iron deficiency anemia, unspecified: Secondary | ICD-10-CM | POA: Diagnosis not present

## 2017-06-26 DIAGNOSIS — Z79899 Other long term (current) drug therapy: Secondary | ICD-10-CM | POA: Diagnosis not present

## 2017-06-26 DIAGNOSIS — N186 End stage renal disease: Secondary | ICD-10-CM | POA: Diagnosis not present

## 2017-06-26 DIAGNOSIS — D631 Anemia in chronic kidney disease: Secondary | ICD-10-CM | POA: Diagnosis not present

## 2017-06-26 DIAGNOSIS — N2581 Secondary hyperparathyroidism of renal origin: Secondary | ICD-10-CM | POA: Diagnosis not present

## 2017-06-28 DIAGNOSIS — D509 Iron deficiency anemia, unspecified: Secondary | ICD-10-CM | POA: Diagnosis not present

## 2017-06-28 DIAGNOSIS — N2581 Secondary hyperparathyroidism of renal origin: Secondary | ICD-10-CM | POA: Diagnosis not present

## 2017-06-28 DIAGNOSIS — Z79899 Other long term (current) drug therapy: Secondary | ICD-10-CM | POA: Diagnosis not present

## 2017-06-28 DIAGNOSIS — D631 Anemia in chronic kidney disease: Secondary | ICD-10-CM | POA: Diagnosis not present

## 2017-06-28 DIAGNOSIS — K7689 Other specified diseases of liver: Secondary | ICD-10-CM | POA: Diagnosis not present

## 2017-06-28 DIAGNOSIS — N186 End stage renal disease: Secondary | ICD-10-CM | POA: Diagnosis not present

## 2017-06-30 DIAGNOSIS — N186 End stage renal disease: Secondary | ICD-10-CM | POA: Diagnosis not present

## 2017-06-30 DIAGNOSIS — D631 Anemia in chronic kidney disease: Secondary | ICD-10-CM | POA: Diagnosis not present

## 2017-06-30 DIAGNOSIS — Z79899 Other long term (current) drug therapy: Secondary | ICD-10-CM | POA: Diagnosis not present

## 2017-06-30 DIAGNOSIS — N2581 Secondary hyperparathyroidism of renal origin: Secondary | ICD-10-CM | POA: Diagnosis not present

## 2017-06-30 DIAGNOSIS — K7689 Other specified diseases of liver: Secondary | ICD-10-CM | POA: Diagnosis not present

## 2017-06-30 DIAGNOSIS — D509 Iron deficiency anemia, unspecified: Secondary | ICD-10-CM | POA: Diagnosis not present

## 2017-07-02 DIAGNOSIS — Z79899 Other long term (current) drug therapy: Secondary | ICD-10-CM | POA: Diagnosis not present

## 2017-07-02 DIAGNOSIS — N186 End stage renal disease: Secondary | ICD-10-CM | POA: Diagnosis not present

## 2017-07-02 DIAGNOSIS — D631 Anemia in chronic kidney disease: Secondary | ICD-10-CM | POA: Diagnosis not present

## 2017-07-02 DIAGNOSIS — K7689 Other specified diseases of liver: Secondary | ICD-10-CM | POA: Diagnosis not present

## 2017-07-02 DIAGNOSIS — N2581 Secondary hyperparathyroidism of renal origin: Secondary | ICD-10-CM | POA: Diagnosis not present

## 2017-07-02 DIAGNOSIS — D509 Iron deficiency anemia, unspecified: Secondary | ICD-10-CM | POA: Diagnosis not present

## 2017-07-03 DIAGNOSIS — N2581 Secondary hyperparathyroidism of renal origin: Secondary | ICD-10-CM | POA: Diagnosis not present

## 2017-07-03 DIAGNOSIS — K7689 Other specified diseases of liver: Secondary | ICD-10-CM | POA: Diagnosis not present

## 2017-07-03 DIAGNOSIS — D509 Iron deficiency anemia, unspecified: Secondary | ICD-10-CM | POA: Diagnosis not present

## 2017-07-03 DIAGNOSIS — N186 End stage renal disease: Secondary | ICD-10-CM | POA: Diagnosis not present

## 2017-07-03 DIAGNOSIS — Z79899 Other long term (current) drug therapy: Secondary | ICD-10-CM | POA: Diagnosis not present

## 2017-07-03 DIAGNOSIS — D631 Anemia in chronic kidney disease: Secondary | ICD-10-CM | POA: Diagnosis not present

## 2017-07-05 DIAGNOSIS — Z79899 Other long term (current) drug therapy: Secondary | ICD-10-CM | POA: Diagnosis not present

## 2017-07-05 DIAGNOSIS — D631 Anemia in chronic kidney disease: Secondary | ICD-10-CM | POA: Diagnosis not present

## 2017-07-05 DIAGNOSIS — D509 Iron deficiency anemia, unspecified: Secondary | ICD-10-CM | POA: Diagnosis not present

## 2017-07-05 DIAGNOSIS — K7689 Other specified diseases of liver: Secondary | ICD-10-CM | POA: Diagnosis not present

## 2017-07-05 DIAGNOSIS — N186 End stage renal disease: Secondary | ICD-10-CM | POA: Diagnosis not present

## 2017-07-05 DIAGNOSIS — N2581 Secondary hyperparathyroidism of renal origin: Secondary | ICD-10-CM | POA: Diagnosis not present

## 2017-07-07 DIAGNOSIS — N2581 Secondary hyperparathyroidism of renal origin: Secondary | ICD-10-CM | POA: Diagnosis not present

## 2017-07-07 DIAGNOSIS — Z79899 Other long term (current) drug therapy: Secondary | ICD-10-CM | POA: Diagnosis not present

## 2017-07-07 DIAGNOSIS — D509 Iron deficiency anemia, unspecified: Secondary | ICD-10-CM | POA: Diagnosis not present

## 2017-07-07 DIAGNOSIS — N186 End stage renal disease: Secondary | ICD-10-CM | POA: Diagnosis not present

## 2017-07-07 DIAGNOSIS — D631 Anemia in chronic kidney disease: Secondary | ICD-10-CM | POA: Diagnosis not present

## 2017-07-07 DIAGNOSIS — K7689 Other specified diseases of liver: Secondary | ICD-10-CM | POA: Diagnosis not present

## 2017-07-09 DIAGNOSIS — K7689 Other specified diseases of liver: Secondary | ICD-10-CM | POA: Diagnosis not present

## 2017-07-09 DIAGNOSIS — Z79899 Other long term (current) drug therapy: Secondary | ICD-10-CM | POA: Diagnosis not present

## 2017-07-09 DIAGNOSIS — D631 Anemia in chronic kidney disease: Secondary | ICD-10-CM | POA: Diagnosis not present

## 2017-07-09 DIAGNOSIS — N2581 Secondary hyperparathyroidism of renal origin: Secondary | ICD-10-CM | POA: Diagnosis not present

## 2017-07-09 DIAGNOSIS — N186 End stage renal disease: Secondary | ICD-10-CM | POA: Diagnosis not present

## 2017-07-09 DIAGNOSIS — D509 Iron deficiency anemia, unspecified: Secondary | ICD-10-CM | POA: Diagnosis not present

## 2017-07-10 DIAGNOSIS — Z79899 Other long term (current) drug therapy: Secondary | ICD-10-CM | POA: Diagnosis not present

## 2017-07-10 DIAGNOSIS — N2581 Secondary hyperparathyroidism of renal origin: Secondary | ICD-10-CM | POA: Diagnosis not present

## 2017-07-10 DIAGNOSIS — D509 Iron deficiency anemia, unspecified: Secondary | ICD-10-CM | POA: Diagnosis not present

## 2017-07-10 DIAGNOSIS — D631 Anemia in chronic kidney disease: Secondary | ICD-10-CM | POA: Diagnosis not present

## 2017-07-10 DIAGNOSIS — N186 End stage renal disease: Secondary | ICD-10-CM | POA: Diagnosis not present

## 2017-07-10 DIAGNOSIS — K7689 Other specified diseases of liver: Secondary | ICD-10-CM | POA: Diagnosis not present

## 2017-07-12 DIAGNOSIS — D509 Iron deficiency anemia, unspecified: Secondary | ICD-10-CM | POA: Diagnosis not present

## 2017-07-12 DIAGNOSIS — E7849 Other hyperlipidemia: Secondary | ICD-10-CM | POA: Diagnosis not present

## 2017-07-12 DIAGNOSIS — N186 End stage renal disease: Secondary | ICD-10-CM | POA: Diagnosis not present

## 2017-07-12 DIAGNOSIS — K7689 Other specified diseases of liver: Secondary | ICD-10-CM | POA: Diagnosis not present

## 2017-07-12 DIAGNOSIS — D631 Anemia in chronic kidney disease: Secondary | ICD-10-CM | POA: Diagnosis not present

## 2017-07-12 DIAGNOSIS — Z79899 Other long term (current) drug therapy: Secondary | ICD-10-CM | POA: Diagnosis not present

## 2017-07-14 DIAGNOSIS — K7689 Other specified diseases of liver: Secondary | ICD-10-CM | POA: Diagnosis not present

## 2017-07-14 DIAGNOSIS — D509 Iron deficiency anemia, unspecified: Secondary | ICD-10-CM | POA: Diagnosis not present

## 2017-07-14 DIAGNOSIS — Z79899 Other long term (current) drug therapy: Secondary | ICD-10-CM | POA: Diagnosis not present

## 2017-07-14 DIAGNOSIS — D631 Anemia in chronic kidney disease: Secondary | ICD-10-CM | POA: Diagnosis not present

## 2017-07-14 DIAGNOSIS — N186 End stage renal disease: Secondary | ICD-10-CM | POA: Diagnosis not present

## 2017-07-16 DIAGNOSIS — N186 End stage renal disease: Secondary | ICD-10-CM | POA: Diagnosis not present

## 2017-07-16 DIAGNOSIS — K7689 Other specified diseases of liver: Secondary | ICD-10-CM | POA: Diagnosis not present

## 2017-07-16 DIAGNOSIS — Z79899 Other long term (current) drug therapy: Secondary | ICD-10-CM | POA: Diagnosis not present

## 2017-07-16 DIAGNOSIS — D631 Anemia in chronic kidney disease: Secondary | ICD-10-CM | POA: Diagnosis not present

## 2017-07-16 DIAGNOSIS — D509 Iron deficiency anemia, unspecified: Secondary | ICD-10-CM | POA: Diagnosis not present

## 2017-07-18 DIAGNOSIS — K7689 Other specified diseases of liver: Secondary | ICD-10-CM | POA: Diagnosis not present

## 2017-07-18 DIAGNOSIS — N186 End stage renal disease: Secondary | ICD-10-CM | POA: Diagnosis not present

## 2017-07-18 DIAGNOSIS — Z79899 Other long term (current) drug therapy: Secondary | ICD-10-CM | POA: Diagnosis not present

## 2017-07-18 DIAGNOSIS — D631 Anemia in chronic kidney disease: Secondary | ICD-10-CM | POA: Diagnosis not present

## 2017-07-18 DIAGNOSIS — D509 Iron deficiency anemia, unspecified: Secondary | ICD-10-CM | POA: Diagnosis not present

## 2017-07-19 DIAGNOSIS — D509 Iron deficiency anemia, unspecified: Secondary | ICD-10-CM | POA: Diagnosis not present

## 2017-07-19 DIAGNOSIS — Z79899 Other long term (current) drug therapy: Secondary | ICD-10-CM | POA: Diagnosis not present

## 2017-07-19 DIAGNOSIS — N186 End stage renal disease: Secondary | ICD-10-CM | POA: Diagnosis not present

## 2017-07-19 DIAGNOSIS — K7689 Other specified diseases of liver: Secondary | ICD-10-CM | POA: Diagnosis not present

## 2017-07-19 DIAGNOSIS — D631 Anemia in chronic kidney disease: Secondary | ICD-10-CM | POA: Diagnosis not present

## 2017-07-21 DIAGNOSIS — K7689 Other specified diseases of liver: Secondary | ICD-10-CM | POA: Diagnosis not present

## 2017-07-21 DIAGNOSIS — N186 End stage renal disease: Secondary | ICD-10-CM | POA: Diagnosis not present

## 2017-07-21 DIAGNOSIS — D631 Anemia in chronic kidney disease: Secondary | ICD-10-CM | POA: Diagnosis not present

## 2017-07-21 DIAGNOSIS — Z79899 Other long term (current) drug therapy: Secondary | ICD-10-CM | POA: Diagnosis not present

## 2017-07-21 DIAGNOSIS — D509 Iron deficiency anemia, unspecified: Secondary | ICD-10-CM | POA: Diagnosis not present

## 2017-07-22 DIAGNOSIS — D631 Anemia in chronic kidney disease: Secondary | ICD-10-CM | POA: Diagnosis not present

## 2017-07-22 DIAGNOSIS — N186 End stage renal disease: Secondary | ICD-10-CM | POA: Diagnosis not present

## 2017-07-22 DIAGNOSIS — K7689 Other specified diseases of liver: Secondary | ICD-10-CM | POA: Diagnosis not present

## 2017-07-22 DIAGNOSIS — Z79899 Other long term (current) drug therapy: Secondary | ICD-10-CM | POA: Diagnosis not present

## 2017-07-22 DIAGNOSIS — D509 Iron deficiency anemia, unspecified: Secondary | ICD-10-CM | POA: Diagnosis not present

## 2017-07-24 DIAGNOSIS — K7689 Other specified diseases of liver: Secondary | ICD-10-CM | POA: Diagnosis not present

## 2017-07-24 DIAGNOSIS — D631 Anemia in chronic kidney disease: Secondary | ICD-10-CM | POA: Diagnosis not present

## 2017-07-24 DIAGNOSIS — N186 End stage renal disease: Secondary | ICD-10-CM | POA: Diagnosis not present

## 2017-07-24 DIAGNOSIS — Z79899 Other long term (current) drug therapy: Secondary | ICD-10-CM | POA: Diagnosis not present

## 2017-07-24 DIAGNOSIS — D509 Iron deficiency anemia, unspecified: Secondary | ICD-10-CM | POA: Diagnosis not present

## 2017-07-26 DIAGNOSIS — D509 Iron deficiency anemia, unspecified: Secondary | ICD-10-CM | POA: Diagnosis not present

## 2017-07-26 DIAGNOSIS — Z79899 Other long term (current) drug therapy: Secondary | ICD-10-CM | POA: Diagnosis not present

## 2017-07-26 DIAGNOSIS — D631 Anemia in chronic kidney disease: Secondary | ICD-10-CM | POA: Diagnosis not present

## 2017-07-26 DIAGNOSIS — N186 End stage renal disease: Secondary | ICD-10-CM | POA: Diagnosis not present

## 2017-07-26 DIAGNOSIS — K7689 Other specified diseases of liver: Secondary | ICD-10-CM | POA: Diagnosis not present

## 2017-07-27 ENCOUNTER — Ambulatory Visit (INDEPENDENT_AMBULATORY_CARE_PROVIDER_SITE_OTHER): Payer: Medicare Other | Admitting: Sports Medicine

## 2017-07-27 ENCOUNTER — Encounter: Payer: Self-pay | Admitting: Sports Medicine

## 2017-07-27 DIAGNOSIS — M79674 Pain in right toe(s): Secondary | ICD-10-CM

## 2017-07-27 DIAGNOSIS — B351 Tinea unguium: Secondary | ICD-10-CM

## 2017-07-27 DIAGNOSIS — M79675 Pain in left toe(s): Secondary | ICD-10-CM

## 2017-07-27 DIAGNOSIS — E1140 Type 2 diabetes mellitus with diabetic neuropathy, unspecified: Secondary | ICD-10-CM

## 2017-07-27 NOTE — Progress Notes (Signed)
Subjective: Linda Jordan is a 84 y.o. female patient with history of diabetes who presents to office today complaining of long, painful nails  while ambulating in shoes; unable to trim. Patient states that the glucose readings are no longer recorded as previously noted states that she is doing okay has had some challenges with home dialysis but otherwise doing fine.  Patient Active Problem List   Diagnosis Date Noted  . Thyroid disease 05/28/2013  . Hypertension 05/28/2013  . Arthritis 02/16/2011  . Bilateral swelling of feet 02/16/2011  . Mycotic toenails 02/16/2011   Current Outpatient Medications on File Prior to Visit  Medication Sig Dispense Refill  . Acetaminophen (TYLENOL EX ST ARTHRITIS PAIN PO) Take by mouth.      Marland Kitchen amLODipine (NORVASC) 10 MG tablet Take 10 mg by mouth daily.      Marland Kitchen aspirin EC 81 MG tablet Take 81 mg by mouth.    Marland Kitchen atorvastatin (LIPITOR) 80 MG tablet Take 80 mg by mouth.    Marland Kitchen b complex vitamins capsule Take by mouth.    Marland Kitchen buPROPion (WELLBUTRIN SR) 100 MG 12 hr tablet TAKE 1 TABLET (100 MG TOTAL) BY MOUTH DAILY.  4  . carvedilol (COREG) 3.125 MG tablet Take 3.125 mg by mouth.    . cetirizine (ZYRTEC) 10 MG tablet 1 (ONE) TABLET BY MOUTH AT BEDTIME  12  . hydrochlorothiazide (HYDRODIURIL) 25 MG tablet Take 25 mg by mouth daily.    Marland Kitchen letrozole (FEMARA) 2.5 MG tablet 1 TABLET ONCE DAILY - TAKE DAILY FOR HORMONAL PROTECTION AGAINST FUTURE BREAST CANCER  3  . levothyroxine (SYNTHROID) 25 MCG tablet Take by mouth.    . losartan (COZAAR) 50 MG tablet TAKE 1 TABLET BY MOUTH AT BEDTIME (STOP LISINOPRIL)  11  . metoprolol (LOPRESSOR) 50 MG tablet Take 50 mg by mouth 2 (two) times daily.    . Multiple Vitamin (MULTIVITAMIN) tablet Take 1 tablet by mouth daily.    . Nebivolol HCl (BYSTOLIC) 20 MG TABS Take by mouth.      . Omega-3 Fatty Acids (FISH OIL CONCENTRATE) 300 MG CAPS Take by mouth.    Marland Kitchen omeprazole (PRILOSEC) 40 MG capsule Take 40 mg by mouth daily.      Marland Kitchen  oxycodone (OXY-IR) 5 MG capsule TAKE ONE TO TWO CAPSULES BY MOUTH EVERY 4 HOURS AS NEEDED FOR PAIN  0  . pregabalin (LYRICA) 75 MG capsule Take 75 mg by mouth 2 (two) times daily.      . rosuvastatin (CRESTOR) 10 MG tablet Take 10 mg by mouth daily.      Marland Kitchen spironolactone (ALDACTONE) 100 MG tablet Take 100 mg by mouth.    . warfarin (COUMADIN) 2 MG tablet Take 2 mg by mouth daily.       No current facility-administered medications on file prior to visit.    Allergies  Allergen Reactions  . Codeine Nausea And Vomiting  . Other   . Sulfa Antibiotics Nausea And Vomiting  . Tape     No results found for this or any previous visit (from the past 2160 hour(s)).  Objective: General: Patient is awake, alert, and oriented x 3 and in no acute distress.  Integument: Skin is warm, dry and supple bilateral. Nails are tender, long, thickened and dystrophic with subungual debris, consistent with onychomycosis, 1-5 bilateral. No signs of infection. No open lesions or preulcerative lesions present bilateral. Remaining integument unremarkable.  Vasculature:  Dorsalis Pedis pulse 1/4 bilateral. Posterior Tibial pulse  1/4 bilateral.  Capillary fill time <3 sec 1-5 bilateral. Scant hair growth to the level of the digits. Temperature gradient within normal limits. Trace varicosities present bilateral. No edema present bilateral.   Neurology: The patient has absent sensation measured with a 5.07/10g Semmes Weinstein Monofilament at all pedal sites bilateral . Vibratory sensation absent bilateral with tuning fork. No Babinski sign present bilateral.   Musculoskeletal: Asymptomatic hammertoes pedal deformities noted bilateral. Muscular strength 5/5 in all lower extremity muscular groups bilateral without pain on range of motion . No tenderness with calf compression bilateral.  Assessment and Plan: Problem List Items Addressed This Visit    None    Visit Diagnoses    Pain due to onychomycosis of toenails  of both feet    -  Primary   Type 2 diabetes, controlled, with neuropathy ([place])         -Examined patient. -Mechanically debrided all nails 1-5 bilateral using sterile nail nipper and filed with dremel without incident -Continue with  toe spacers as needed -Answered all patient questions -Patient to return  in 3 months for at risk foot care -Patient advised to call the office if any problems or questions arise in the meantime.  Landis Martins, DPM

## 2017-07-28 DIAGNOSIS — Z79899 Other long term (current) drug therapy: Secondary | ICD-10-CM | POA: Diagnosis not present

## 2017-07-28 DIAGNOSIS — K7689 Other specified diseases of liver: Secondary | ICD-10-CM | POA: Diagnosis not present

## 2017-07-28 DIAGNOSIS — D631 Anemia in chronic kidney disease: Secondary | ICD-10-CM | POA: Diagnosis not present

## 2017-07-28 DIAGNOSIS — D509 Iron deficiency anemia, unspecified: Secondary | ICD-10-CM | POA: Diagnosis not present

## 2017-07-28 DIAGNOSIS — N186 End stage renal disease: Secondary | ICD-10-CM | POA: Diagnosis not present

## 2017-07-30 DIAGNOSIS — K7689 Other specified diseases of liver: Secondary | ICD-10-CM | POA: Diagnosis not present

## 2017-07-30 DIAGNOSIS — D631 Anemia in chronic kidney disease: Secondary | ICD-10-CM | POA: Diagnosis not present

## 2017-07-30 DIAGNOSIS — D509 Iron deficiency anemia, unspecified: Secondary | ICD-10-CM | POA: Diagnosis not present

## 2017-07-30 DIAGNOSIS — Z79899 Other long term (current) drug therapy: Secondary | ICD-10-CM | POA: Diagnosis not present

## 2017-07-30 DIAGNOSIS — N186 End stage renal disease: Secondary | ICD-10-CM | POA: Diagnosis not present

## 2017-07-31 DIAGNOSIS — D631 Anemia in chronic kidney disease: Secondary | ICD-10-CM | POA: Diagnosis not present

## 2017-07-31 DIAGNOSIS — D509 Iron deficiency anemia, unspecified: Secondary | ICD-10-CM | POA: Diagnosis not present

## 2017-07-31 DIAGNOSIS — N186 End stage renal disease: Secondary | ICD-10-CM | POA: Diagnosis not present

## 2017-07-31 DIAGNOSIS — K7689 Other specified diseases of liver: Secondary | ICD-10-CM | POA: Diagnosis not present

## 2017-07-31 DIAGNOSIS — Z79899 Other long term (current) drug therapy: Secondary | ICD-10-CM | POA: Diagnosis not present

## 2017-08-01 DIAGNOSIS — K219 Gastro-esophageal reflux disease without esophagitis: Secondary | ICD-10-CM | POA: Diagnosis not present

## 2017-08-01 DIAGNOSIS — E785 Hyperlipidemia, unspecified: Secondary | ICD-10-CM | POA: Diagnosis not present

## 2017-08-01 DIAGNOSIS — N186 End stage renal disease: Secondary | ICD-10-CM | POA: Diagnosis not present

## 2017-08-01 DIAGNOSIS — J449 Chronic obstructive pulmonary disease, unspecified: Secondary | ICD-10-CM | POA: Diagnosis not present

## 2017-08-01 DIAGNOSIS — E039 Hypothyroidism, unspecified: Secondary | ICD-10-CM | POA: Diagnosis not present

## 2017-08-01 DIAGNOSIS — R600 Localized edema: Secondary | ICD-10-CM | POA: Diagnosis not present

## 2017-08-01 DIAGNOSIS — R197 Diarrhea, unspecified: Secondary | ICD-10-CM | POA: Diagnosis not present

## 2017-08-01 DIAGNOSIS — E1121 Type 2 diabetes mellitus with diabetic nephropathy: Secondary | ICD-10-CM | POA: Diagnosis not present

## 2017-08-01 DIAGNOSIS — I82409 Acute embolism and thrombosis of unspecified deep veins of unspecified lower extremity: Secondary | ICD-10-CM | POA: Diagnosis not present

## 2017-08-01 DIAGNOSIS — B0229 Other postherpetic nervous system involvement: Secondary | ICD-10-CM | POA: Diagnosis not present

## 2017-08-01 DIAGNOSIS — M25561 Pain in right knee: Secondary | ICD-10-CM | POA: Diagnosis not present

## 2017-08-01 DIAGNOSIS — M545 Low back pain: Secondary | ICD-10-CM | POA: Diagnosis not present

## 2017-08-02 ENCOUNTER — Ambulatory Visit: Payer: Medicare Other | Admitting: Sports Medicine

## 2017-08-02 DIAGNOSIS — N186 End stage renal disease: Secondary | ICD-10-CM | POA: Diagnosis not present

## 2017-08-02 DIAGNOSIS — Z79899 Other long term (current) drug therapy: Secondary | ICD-10-CM | POA: Diagnosis not present

## 2017-08-02 DIAGNOSIS — K7689 Other specified diseases of liver: Secondary | ICD-10-CM | POA: Diagnosis not present

## 2017-08-02 DIAGNOSIS — D631 Anemia in chronic kidney disease: Secondary | ICD-10-CM | POA: Diagnosis not present

## 2017-08-02 DIAGNOSIS — D509 Iron deficiency anemia, unspecified: Secondary | ICD-10-CM | POA: Diagnosis not present

## 2017-08-03 DIAGNOSIS — K7689 Other specified diseases of liver: Secondary | ICD-10-CM | POA: Diagnosis not present

## 2017-08-03 DIAGNOSIS — N186 End stage renal disease: Secondary | ICD-10-CM | POA: Diagnosis not present

## 2017-08-03 DIAGNOSIS — Z79899 Other long term (current) drug therapy: Secondary | ICD-10-CM | POA: Diagnosis not present

## 2017-08-03 DIAGNOSIS — D631 Anemia in chronic kidney disease: Secondary | ICD-10-CM | POA: Diagnosis not present

## 2017-08-03 DIAGNOSIS — D509 Iron deficiency anemia, unspecified: Secondary | ICD-10-CM | POA: Diagnosis not present

## 2017-08-05 DIAGNOSIS — D631 Anemia in chronic kidney disease: Secondary | ICD-10-CM | POA: Diagnosis not present

## 2017-08-05 DIAGNOSIS — Z79899 Other long term (current) drug therapy: Secondary | ICD-10-CM | POA: Diagnosis not present

## 2017-08-05 DIAGNOSIS — D509 Iron deficiency anemia, unspecified: Secondary | ICD-10-CM | POA: Diagnosis not present

## 2017-08-05 DIAGNOSIS — N186 End stage renal disease: Secondary | ICD-10-CM | POA: Diagnosis not present

## 2017-08-05 DIAGNOSIS — K7689 Other specified diseases of liver: Secondary | ICD-10-CM | POA: Diagnosis not present

## 2017-08-06 DIAGNOSIS — Z79899 Other long term (current) drug therapy: Secondary | ICD-10-CM | POA: Diagnosis not present

## 2017-08-06 DIAGNOSIS — N186 End stage renal disease: Secondary | ICD-10-CM | POA: Diagnosis not present

## 2017-08-06 DIAGNOSIS — K7689 Other specified diseases of liver: Secondary | ICD-10-CM | POA: Diagnosis not present

## 2017-08-06 DIAGNOSIS — D509 Iron deficiency anemia, unspecified: Secondary | ICD-10-CM | POA: Diagnosis not present

## 2017-08-06 DIAGNOSIS — D631 Anemia in chronic kidney disease: Secondary | ICD-10-CM | POA: Diagnosis not present

## 2017-08-08 DIAGNOSIS — D631 Anemia in chronic kidney disease: Secondary | ICD-10-CM | POA: Diagnosis not present

## 2017-08-08 DIAGNOSIS — N186 End stage renal disease: Secondary | ICD-10-CM | POA: Diagnosis not present

## 2017-08-08 DIAGNOSIS — D509 Iron deficiency anemia, unspecified: Secondary | ICD-10-CM | POA: Diagnosis not present

## 2017-08-08 DIAGNOSIS — Z79899 Other long term (current) drug therapy: Secondary | ICD-10-CM | POA: Diagnosis not present

## 2017-08-08 DIAGNOSIS — K7689 Other specified diseases of liver: Secondary | ICD-10-CM | POA: Diagnosis not present

## 2017-08-09 DIAGNOSIS — D509 Iron deficiency anemia, unspecified: Secondary | ICD-10-CM | POA: Diagnosis not present

## 2017-08-09 DIAGNOSIS — K7689 Other specified diseases of liver: Secondary | ICD-10-CM | POA: Diagnosis not present

## 2017-08-09 DIAGNOSIS — D631 Anemia in chronic kidney disease: Secondary | ICD-10-CM | POA: Diagnosis not present

## 2017-08-09 DIAGNOSIS — Z79899 Other long term (current) drug therapy: Secondary | ICD-10-CM | POA: Diagnosis not present

## 2017-08-09 DIAGNOSIS — Z992 Dependence on renal dialysis: Secondary | ICD-10-CM | POA: Diagnosis not present

## 2017-08-09 DIAGNOSIS — N059 Unspecified nephritic syndrome with unspecified morphologic changes: Secondary | ICD-10-CM | POA: Diagnosis not present

## 2017-08-09 DIAGNOSIS — N186 End stage renal disease: Secondary | ICD-10-CM | POA: Diagnosis not present

## 2017-08-10 DIAGNOSIS — E1121 Type 2 diabetes mellitus with diabetic nephropathy: Secondary | ICD-10-CM | POA: Diagnosis not present

## 2017-08-10 DIAGNOSIS — E785 Hyperlipidemia, unspecified: Secondary | ICD-10-CM | POA: Diagnosis not present

## 2017-08-10 DIAGNOSIS — M545 Low back pain: Secondary | ICD-10-CM | POA: Diagnosis not present

## 2017-08-10 DIAGNOSIS — N059 Unspecified nephritic syndrome with unspecified morphologic changes: Secondary | ICD-10-CM | POA: Diagnosis not present

## 2017-08-10 DIAGNOSIS — B0229 Other postherpetic nervous system involvement: Secondary | ICD-10-CM | POA: Diagnosis not present

## 2017-08-10 DIAGNOSIS — J449 Chronic obstructive pulmonary disease, unspecified: Secondary | ICD-10-CM | POA: Diagnosis not present

## 2017-08-10 DIAGNOSIS — R600 Localized edema: Secondary | ICD-10-CM | POA: Diagnosis not present

## 2017-08-10 DIAGNOSIS — E039 Hypothyroidism, unspecified: Secondary | ICD-10-CM | POA: Diagnosis not present

## 2017-08-10 DIAGNOSIS — R197 Diarrhea, unspecified: Secondary | ICD-10-CM | POA: Diagnosis not present

## 2017-08-10 DIAGNOSIS — K219 Gastro-esophageal reflux disease without esophagitis: Secondary | ICD-10-CM | POA: Diagnosis not present

## 2017-08-10 DIAGNOSIS — N186 End stage renal disease: Secondary | ICD-10-CM | POA: Diagnosis not present

## 2017-08-10 DIAGNOSIS — D0582 Other specified type of carcinoma in situ of left breast: Secondary | ICD-10-CM | POA: Diagnosis not present

## 2017-08-10 DIAGNOSIS — I82409 Acute embolism and thrombosis of unspecified deep veins of unspecified lower extremity: Secondary | ICD-10-CM | POA: Diagnosis not present

## 2017-08-10 DIAGNOSIS — Z992 Dependence on renal dialysis: Secondary | ICD-10-CM | POA: Diagnosis not present

## 2017-08-11 DIAGNOSIS — Z4931 Encounter for adequacy testing for hemodialysis: Secondary | ICD-10-CM | POA: Diagnosis not present

## 2017-08-11 DIAGNOSIS — D509 Iron deficiency anemia, unspecified: Secondary | ICD-10-CM | POA: Diagnosis not present

## 2017-08-11 DIAGNOSIS — N186 End stage renal disease: Secondary | ICD-10-CM | POA: Diagnosis not present

## 2017-08-11 DIAGNOSIS — Z79899 Other long term (current) drug therapy: Secondary | ICD-10-CM | POA: Diagnosis not present

## 2017-08-11 DIAGNOSIS — N2581 Secondary hyperparathyroidism of renal origin: Secondary | ICD-10-CM | POA: Diagnosis not present

## 2017-08-13 DIAGNOSIS — Z4931 Encounter for adequacy testing for hemodialysis: Secondary | ICD-10-CM | POA: Diagnosis not present

## 2017-08-13 DIAGNOSIS — D509 Iron deficiency anemia, unspecified: Secondary | ICD-10-CM | POA: Diagnosis not present

## 2017-08-13 DIAGNOSIS — Z79899 Other long term (current) drug therapy: Secondary | ICD-10-CM | POA: Diagnosis not present

## 2017-08-13 DIAGNOSIS — N186 End stage renal disease: Secondary | ICD-10-CM | POA: Diagnosis not present

## 2017-08-13 DIAGNOSIS — N2581 Secondary hyperparathyroidism of renal origin: Secondary | ICD-10-CM | POA: Diagnosis not present

## 2017-08-14 DIAGNOSIS — D509 Iron deficiency anemia, unspecified: Secondary | ICD-10-CM | POA: Diagnosis not present

## 2017-08-14 DIAGNOSIS — N186 End stage renal disease: Secondary | ICD-10-CM | POA: Diagnosis not present

## 2017-08-14 DIAGNOSIS — Z79899 Other long term (current) drug therapy: Secondary | ICD-10-CM | POA: Diagnosis not present

## 2017-08-14 DIAGNOSIS — Z4931 Encounter for adequacy testing for hemodialysis: Secondary | ICD-10-CM | POA: Diagnosis not present

## 2017-08-14 DIAGNOSIS — N2581 Secondary hyperparathyroidism of renal origin: Secondary | ICD-10-CM | POA: Diagnosis not present

## 2017-08-16 DIAGNOSIS — N186 End stage renal disease: Secondary | ICD-10-CM | POA: Diagnosis not present

## 2017-08-16 DIAGNOSIS — Z79899 Other long term (current) drug therapy: Secondary | ICD-10-CM | POA: Diagnosis not present

## 2017-08-16 DIAGNOSIS — N2581 Secondary hyperparathyroidism of renal origin: Secondary | ICD-10-CM | POA: Diagnosis not present

## 2017-08-16 DIAGNOSIS — D509 Iron deficiency anemia, unspecified: Secondary | ICD-10-CM | POA: Diagnosis not present

## 2017-08-16 DIAGNOSIS — Z4931 Encounter for adequacy testing for hemodialysis: Secondary | ICD-10-CM | POA: Diagnosis not present

## 2017-08-18 DIAGNOSIS — N186 End stage renal disease: Secondary | ICD-10-CM | POA: Diagnosis not present

## 2017-08-18 DIAGNOSIS — N2581 Secondary hyperparathyroidism of renal origin: Secondary | ICD-10-CM | POA: Diagnosis not present

## 2017-08-18 DIAGNOSIS — Z79899 Other long term (current) drug therapy: Secondary | ICD-10-CM | POA: Diagnosis not present

## 2017-08-18 DIAGNOSIS — D509 Iron deficiency anemia, unspecified: Secondary | ICD-10-CM | POA: Diagnosis not present

## 2017-08-18 DIAGNOSIS — Z4931 Encounter for adequacy testing for hemodialysis: Secondary | ICD-10-CM | POA: Diagnosis not present

## 2017-08-20 DIAGNOSIS — Z79899 Other long term (current) drug therapy: Secondary | ICD-10-CM | POA: Diagnosis not present

## 2017-08-20 DIAGNOSIS — D509 Iron deficiency anemia, unspecified: Secondary | ICD-10-CM | POA: Diagnosis not present

## 2017-08-20 DIAGNOSIS — Z4931 Encounter for adequacy testing for hemodialysis: Secondary | ICD-10-CM | POA: Diagnosis not present

## 2017-08-20 DIAGNOSIS — N186 End stage renal disease: Secondary | ICD-10-CM | POA: Diagnosis not present

## 2017-08-20 DIAGNOSIS — N2581 Secondary hyperparathyroidism of renal origin: Secondary | ICD-10-CM | POA: Diagnosis not present

## 2017-08-21 DIAGNOSIS — N186 End stage renal disease: Secondary | ICD-10-CM | POA: Diagnosis not present

## 2017-08-21 DIAGNOSIS — Z4931 Encounter for adequacy testing for hemodialysis: Secondary | ICD-10-CM | POA: Diagnosis not present

## 2017-08-21 DIAGNOSIS — N2581 Secondary hyperparathyroidism of renal origin: Secondary | ICD-10-CM | POA: Diagnosis not present

## 2017-08-21 DIAGNOSIS — D509 Iron deficiency anemia, unspecified: Secondary | ICD-10-CM | POA: Diagnosis not present

## 2017-08-21 DIAGNOSIS — Z79899 Other long term (current) drug therapy: Secondary | ICD-10-CM | POA: Diagnosis not present

## 2017-08-23 DIAGNOSIS — Z79899 Other long term (current) drug therapy: Secondary | ICD-10-CM | POA: Diagnosis not present

## 2017-08-23 DIAGNOSIS — N186 End stage renal disease: Secondary | ICD-10-CM | POA: Diagnosis not present

## 2017-08-23 DIAGNOSIS — D509 Iron deficiency anemia, unspecified: Secondary | ICD-10-CM | POA: Diagnosis not present

## 2017-08-23 DIAGNOSIS — N2581 Secondary hyperparathyroidism of renal origin: Secondary | ICD-10-CM | POA: Diagnosis not present

## 2017-08-23 DIAGNOSIS — Z4931 Encounter for adequacy testing for hemodialysis: Secondary | ICD-10-CM | POA: Diagnosis not present

## 2017-08-25 DIAGNOSIS — Z79899 Other long term (current) drug therapy: Secondary | ICD-10-CM | POA: Diagnosis not present

## 2017-08-25 DIAGNOSIS — D509 Iron deficiency anemia, unspecified: Secondary | ICD-10-CM | POA: Diagnosis not present

## 2017-08-25 DIAGNOSIS — N2581 Secondary hyperparathyroidism of renal origin: Secondary | ICD-10-CM | POA: Diagnosis not present

## 2017-08-25 DIAGNOSIS — N186 End stage renal disease: Secondary | ICD-10-CM | POA: Diagnosis not present

## 2017-08-25 DIAGNOSIS — Z4931 Encounter for adequacy testing for hemodialysis: Secondary | ICD-10-CM | POA: Diagnosis not present

## 2017-08-27 DIAGNOSIS — N2581 Secondary hyperparathyroidism of renal origin: Secondary | ICD-10-CM | POA: Diagnosis not present

## 2017-08-27 DIAGNOSIS — Z4931 Encounter for adequacy testing for hemodialysis: Secondary | ICD-10-CM | POA: Diagnosis not present

## 2017-08-27 DIAGNOSIS — N186 End stage renal disease: Secondary | ICD-10-CM | POA: Diagnosis not present

## 2017-08-27 DIAGNOSIS — Z79899 Other long term (current) drug therapy: Secondary | ICD-10-CM | POA: Diagnosis not present

## 2017-08-27 DIAGNOSIS — D509 Iron deficiency anemia, unspecified: Secondary | ICD-10-CM | POA: Diagnosis not present

## 2017-08-28 DIAGNOSIS — Z79899 Other long term (current) drug therapy: Secondary | ICD-10-CM | POA: Diagnosis not present

## 2017-08-28 DIAGNOSIS — Z4931 Encounter for adequacy testing for hemodialysis: Secondary | ICD-10-CM | POA: Diagnosis not present

## 2017-08-28 DIAGNOSIS — D509 Iron deficiency anemia, unspecified: Secondary | ICD-10-CM | POA: Diagnosis not present

## 2017-08-28 DIAGNOSIS — N2581 Secondary hyperparathyroidism of renal origin: Secondary | ICD-10-CM | POA: Diagnosis not present

## 2017-08-28 DIAGNOSIS — N186 End stage renal disease: Secondary | ICD-10-CM | POA: Diagnosis not present

## 2017-08-30 DIAGNOSIS — N186 End stage renal disease: Secondary | ICD-10-CM | POA: Diagnosis not present

## 2017-08-30 DIAGNOSIS — Z79899 Other long term (current) drug therapy: Secondary | ICD-10-CM | POA: Diagnosis not present

## 2017-08-30 DIAGNOSIS — N2581 Secondary hyperparathyroidism of renal origin: Secondary | ICD-10-CM | POA: Diagnosis not present

## 2017-08-30 DIAGNOSIS — D509 Iron deficiency anemia, unspecified: Secondary | ICD-10-CM | POA: Diagnosis not present

## 2017-08-30 DIAGNOSIS — Z4931 Encounter for adequacy testing for hemodialysis: Secondary | ICD-10-CM | POA: Diagnosis not present

## 2017-09-01 DIAGNOSIS — Z4931 Encounter for adequacy testing for hemodialysis: Secondary | ICD-10-CM | POA: Diagnosis not present

## 2017-09-01 DIAGNOSIS — N186 End stage renal disease: Secondary | ICD-10-CM | POA: Diagnosis not present

## 2017-09-01 DIAGNOSIS — N2581 Secondary hyperparathyroidism of renal origin: Secondary | ICD-10-CM | POA: Diagnosis not present

## 2017-09-01 DIAGNOSIS — D509 Iron deficiency anemia, unspecified: Secondary | ICD-10-CM | POA: Diagnosis not present

## 2017-09-01 DIAGNOSIS — Z79899 Other long term (current) drug therapy: Secondary | ICD-10-CM | POA: Diagnosis not present

## 2017-09-02 DIAGNOSIS — Z79899 Other long term (current) drug therapy: Secondary | ICD-10-CM | POA: Diagnosis not present

## 2017-09-02 DIAGNOSIS — Z4931 Encounter for adequacy testing for hemodialysis: Secondary | ICD-10-CM | POA: Diagnosis not present

## 2017-09-02 DIAGNOSIS — N186 End stage renal disease: Secondary | ICD-10-CM | POA: Diagnosis not present

## 2017-09-02 DIAGNOSIS — D509 Iron deficiency anemia, unspecified: Secondary | ICD-10-CM | POA: Diagnosis not present

## 2017-09-02 DIAGNOSIS — N2581 Secondary hyperparathyroidism of renal origin: Secondary | ICD-10-CM | POA: Diagnosis not present

## 2017-09-03 DIAGNOSIS — N2581 Secondary hyperparathyroidism of renal origin: Secondary | ICD-10-CM | POA: Diagnosis not present

## 2017-09-03 DIAGNOSIS — R922 Inconclusive mammogram: Secondary | ICD-10-CM | POA: Diagnosis not present

## 2017-09-03 DIAGNOSIS — M8589 Other specified disorders of bone density and structure, multiple sites: Secondary | ICD-10-CM | POA: Diagnosis not present

## 2017-09-03 DIAGNOSIS — C50412 Malignant neoplasm of upper-outer quadrant of left female breast: Secondary | ICD-10-CM | POA: Diagnosis not present

## 2017-09-03 DIAGNOSIS — Z4931 Encounter for adequacy testing for hemodialysis: Secondary | ICD-10-CM | POA: Diagnosis not present

## 2017-09-03 DIAGNOSIS — N186 End stage renal disease: Secondary | ICD-10-CM | POA: Diagnosis not present

## 2017-09-03 DIAGNOSIS — Z79899 Other long term (current) drug therapy: Secondary | ICD-10-CM | POA: Diagnosis not present

## 2017-09-03 DIAGNOSIS — D509 Iron deficiency anemia, unspecified: Secondary | ICD-10-CM | POA: Diagnosis not present

## 2017-09-04 DIAGNOSIS — N2581 Secondary hyperparathyroidism of renal origin: Secondary | ICD-10-CM | POA: Diagnosis not present

## 2017-09-04 DIAGNOSIS — N186 End stage renal disease: Secondary | ICD-10-CM | POA: Diagnosis not present

## 2017-09-04 DIAGNOSIS — D509 Iron deficiency anemia, unspecified: Secondary | ICD-10-CM | POA: Diagnosis not present

## 2017-09-04 DIAGNOSIS — Z4931 Encounter for adequacy testing for hemodialysis: Secondary | ICD-10-CM | POA: Diagnosis not present

## 2017-09-04 DIAGNOSIS — Z79899 Other long term (current) drug therapy: Secondary | ICD-10-CM | POA: Diagnosis not present

## 2017-09-05 DIAGNOSIS — N2581 Secondary hyperparathyroidism of renal origin: Secondary | ICD-10-CM | POA: Diagnosis not present

## 2017-09-05 DIAGNOSIS — Z79899 Other long term (current) drug therapy: Secondary | ICD-10-CM | POA: Diagnosis not present

## 2017-09-05 DIAGNOSIS — D509 Iron deficiency anemia, unspecified: Secondary | ICD-10-CM | POA: Diagnosis not present

## 2017-09-05 DIAGNOSIS — N186 End stage renal disease: Secondary | ICD-10-CM | POA: Diagnosis not present

## 2017-09-05 DIAGNOSIS — Z4931 Encounter for adequacy testing for hemodialysis: Secondary | ICD-10-CM | POA: Diagnosis not present

## 2017-09-06 DIAGNOSIS — Z4931 Encounter for adequacy testing for hemodialysis: Secondary | ICD-10-CM | POA: Diagnosis not present

## 2017-09-06 DIAGNOSIS — N2581 Secondary hyperparathyroidism of renal origin: Secondary | ICD-10-CM | POA: Diagnosis not present

## 2017-09-06 DIAGNOSIS — Z79899 Other long term (current) drug therapy: Secondary | ICD-10-CM | POA: Diagnosis not present

## 2017-09-06 DIAGNOSIS — N186 End stage renal disease: Secondary | ICD-10-CM | POA: Diagnosis not present

## 2017-09-06 DIAGNOSIS — D509 Iron deficiency anemia, unspecified: Secondary | ICD-10-CM | POA: Diagnosis not present

## 2017-09-07 DIAGNOSIS — N2581 Secondary hyperparathyroidism of renal origin: Secondary | ICD-10-CM | POA: Diagnosis not present

## 2017-09-07 DIAGNOSIS — Z992 Dependence on renal dialysis: Secondary | ICD-10-CM | POA: Diagnosis not present

## 2017-09-07 DIAGNOSIS — N059 Unspecified nephritic syndrome with unspecified morphologic changes: Secondary | ICD-10-CM | POA: Diagnosis not present

## 2017-09-07 DIAGNOSIS — N186 End stage renal disease: Secondary | ICD-10-CM | POA: Diagnosis not present

## 2017-09-07 DIAGNOSIS — Z79899 Other long term (current) drug therapy: Secondary | ICD-10-CM | POA: Diagnosis not present

## 2017-09-08 DIAGNOSIS — N2581 Secondary hyperparathyroidism of renal origin: Secondary | ICD-10-CM | POA: Diagnosis not present

## 2017-09-08 DIAGNOSIS — Z79899 Other long term (current) drug therapy: Secondary | ICD-10-CM | POA: Diagnosis not present

## 2017-09-08 DIAGNOSIS — N186 End stage renal disease: Secondary | ICD-10-CM | POA: Diagnosis not present

## 2017-09-10 DIAGNOSIS — Z79899 Other long term (current) drug therapy: Secondary | ICD-10-CM | POA: Diagnosis not present

## 2017-09-10 DIAGNOSIS — N2581 Secondary hyperparathyroidism of renal origin: Secondary | ICD-10-CM | POA: Diagnosis not present

## 2017-09-10 DIAGNOSIS — N186 End stage renal disease: Secondary | ICD-10-CM | POA: Diagnosis not present

## 2017-09-11 DIAGNOSIS — Z79899 Other long term (current) drug therapy: Secondary | ICD-10-CM | POA: Diagnosis not present

## 2017-09-11 DIAGNOSIS — N2581 Secondary hyperparathyroidism of renal origin: Secondary | ICD-10-CM | POA: Diagnosis not present

## 2017-09-11 DIAGNOSIS — N186 End stage renal disease: Secondary | ICD-10-CM | POA: Diagnosis not present

## 2017-09-12 DIAGNOSIS — Z170 Estrogen receptor positive status [ER+]: Secondary | ICD-10-CM | POA: Diagnosis not present

## 2017-09-12 DIAGNOSIS — Z79811 Long term (current) use of aromatase inhibitors: Secondary | ICD-10-CM | POA: Diagnosis not present

## 2017-09-12 DIAGNOSIS — M8580 Other specified disorders of bone density and structure, unspecified site: Secondary | ICD-10-CM | POA: Diagnosis not present

## 2017-09-12 DIAGNOSIS — C50919 Malignant neoplasm of unspecified site of unspecified female breast: Secondary | ICD-10-CM | POA: Diagnosis not present

## 2017-09-12 DIAGNOSIS — Z853 Personal history of malignant neoplasm of breast: Secondary | ICD-10-CM | POA: Diagnosis not present

## 2017-09-13 DIAGNOSIS — Z79899 Other long term (current) drug therapy: Secondary | ICD-10-CM | POA: Diagnosis not present

## 2017-09-13 DIAGNOSIS — N186 End stage renal disease: Secondary | ICD-10-CM | POA: Diagnosis not present

## 2017-09-13 DIAGNOSIS — N2581 Secondary hyperparathyroidism of renal origin: Secondary | ICD-10-CM | POA: Diagnosis not present

## 2017-09-14 DIAGNOSIS — Z79899 Other long term (current) drug therapy: Secondary | ICD-10-CM | POA: Diagnosis not present

## 2017-09-14 DIAGNOSIS — N186 End stage renal disease: Secondary | ICD-10-CM | POA: Diagnosis not present

## 2017-09-14 DIAGNOSIS — N2581 Secondary hyperparathyroidism of renal origin: Secondary | ICD-10-CM | POA: Diagnosis not present

## 2017-09-17 DIAGNOSIS — N186 End stage renal disease: Secondary | ICD-10-CM | POA: Diagnosis not present

## 2017-09-17 DIAGNOSIS — Z79899 Other long term (current) drug therapy: Secondary | ICD-10-CM | POA: Diagnosis not present

## 2017-09-17 DIAGNOSIS — N2581 Secondary hyperparathyroidism of renal origin: Secondary | ICD-10-CM | POA: Diagnosis not present

## 2017-09-18 DIAGNOSIS — I10 Essential (primary) hypertension: Secondary | ICD-10-CM | POA: Diagnosis not present

## 2017-09-18 DIAGNOSIS — J449 Chronic obstructive pulmonary disease, unspecified: Secondary | ICD-10-CM | POA: Diagnosis not present

## 2017-09-18 DIAGNOSIS — R600 Localized edema: Secondary | ICD-10-CM | POA: Diagnosis not present

## 2017-09-18 DIAGNOSIS — M545 Low back pain: Secondary | ICD-10-CM | POA: Diagnosis not present

## 2017-09-18 DIAGNOSIS — E785 Hyperlipidemia, unspecified: Secondary | ICD-10-CM | POA: Diagnosis not present

## 2017-09-18 DIAGNOSIS — J208 Acute bronchitis due to other specified organisms: Secondary | ICD-10-CM | POA: Diagnosis not present

## 2017-09-18 DIAGNOSIS — E039 Hypothyroidism, unspecified: Secondary | ICD-10-CM | POA: Diagnosis not present

## 2017-09-18 DIAGNOSIS — I82409 Acute embolism and thrombosis of unspecified deep veins of unspecified lower extremity: Secondary | ICD-10-CM | POA: Diagnosis not present

## 2017-09-18 DIAGNOSIS — B0229 Other postherpetic nervous system involvement: Secondary | ICD-10-CM | POA: Diagnosis not present

## 2017-09-18 DIAGNOSIS — E1121 Type 2 diabetes mellitus with diabetic nephropathy: Secondary | ICD-10-CM | POA: Diagnosis not present

## 2017-09-18 DIAGNOSIS — N186 End stage renal disease: Secondary | ICD-10-CM | POA: Diagnosis not present

## 2017-09-18 DIAGNOSIS — K219 Gastro-esophageal reflux disease without esophagitis: Secondary | ICD-10-CM | POA: Diagnosis not present

## 2017-09-19 DIAGNOSIS — Z79899 Other long term (current) drug therapy: Secondary | ICD-10-CM | POA: Diagnosis not present

## 2017-09-19 DIAGNOSIS — N2581 Secondary hyperparathyroidism of renal origin: Secondary | ICD-10-CM | POA: Diagnosis not present

## 2017-09-19 DIAGNOSIS — N186 End stage renal disease: Secondary | ICD-10-CM | POA: Diagnosis not present

## 2017-09-20 DIAGNOSIS — N2581 Secondary hyperparathyroidism of renal origin: Secondary | ICD-10-CM | POA: Diagnosis not present

## 2017-09-20 DIAGNOSIS — Z79899 Other long term (current) drug therapy: Secondary | ICD-10-CM | POA: Diagnosis not present

## 2017-09-20 DIAGNOSIS — N186 End stage renal disease: Secondary | ICD-10-CM | POA: Diagnosis not present

## 2017-09-21 DIAGNOSIS — N186 End stage renal disease: Secondary | ICD-10-CM | POA: Diagnosis not present

## 2017-09-21 DIAGNOSIS — N2581 Secondary hyperparathyroidism of renal origin: Secondary | ICD-10-CM | POA: Diagnosis not present

## 2017-09-21 DIAGNOSIS — Z79899 Other long term (current) drug therapy: Secondary | ICD-10-CM | POA: Diagnosis not present

## 2017-09-24 DIAGNOSIS — N186 End stage renal disease: Secondary | ICD-10-CM | POA: Diagnosis not present

## 2017-09-24 DIAGNOSIS — Z79899 Other long term (current) drug therapy: Secondary | ICD-10-CM | POA: Diagnosis not present

## 2017-09-24 DIAGNOSIS — N2581 Secondary hyperparathyroidism of renal origin: Secondary | ICD-10-CM | POA: Diagnosis not present

## 2017-09-25 DIAGNOSIS — N2581 Secondary hyperparathyroidism of renal origin: Secondary | ICD-10-CM | POA: Diagnosis not present

## 2017-09-25 DIAGNOSIS — N186 End stage renal disease: Secondary | ICD-10-CM | POA: Diagnosis not present

## 2017-09-25 DIAGNOSIS — Z79899 Other long term (current) drug therapy: Secondary | ICD-10-CM | POA: Diagnosis not present

## 2017-09-27 DIAGNOSIS — B0229 Other postherpetic nervous system involvement: Secondary | ICD-10-CM | POA: Diagnosis not present

## 2017-09-27 DIAGNOSIS — E1121 Type 2 diabetes mellitus with diabetic nephropathy: Secondary | ICD-10-CM | POA: Diagnosis not present

## 2017-09-27 DIAGNOSIS — N2581 Secondary hyperparathyroidism of renal origin: Secondary | ICD-10-CM | POA: Diagnosis not present

## 2017-09-27 DIAGNOSIS — R197 Diarrhea, unspecified: Secondary | ICD-10-CM | POA: Diagnosis not present

## 2017-09-27 DIAGNOSIS — Z79899 Other long term (current) drug therapy: Secondary | ICD-10-CM | POA: Diagnosis not present

## 2017-09-27 DIAGNOSIS — I82409 Acute embolism and thrombosis of unspecified deep veins of unspecified lower extremity: Secondary | ICD-10-CM | POA: Diagnosis not present

## 2017-09-27 DIAGNOSIS — K219 Gastro-esophageal reflux disease without esophagitis: Secondary | ICD-10-CM | POA: Diagnosis not present

## 2017-09-27 DIAGNOSIS — E785 Hyperlipidemia, unspecified: Secondary | ICD-10-CM | POA: Diagnosis not present

## 2017-09-27 DIAGNOSIS — E039 Hypothyroidism, unspecified: Secondary | ICD-10-CM | POA: Diagnosis not present

## 2017-09-27 DIAGNOSIS — R600 Localized edema: Secondary | ICD-10-CM | POA: Diagnosis not present

## 2017-09-27 DIAGNOSIS — N186 End stage renal disease: Secondary | ICD-10-CM | POA: Diagnosis not present

## 2017-09-27 DIAGNOSIS — M545 Low back pain: Secondary | ICD-10-CM | POA: Diagnosis not present

## 2017-09-27 DIAGNOSIS — J208 Acute bronchitis due to other specified organisms: Secondary | ICD-10-CM | POA: Diagnosis not present

## 2017-09-27 DIAGNOSIS — J449 Chronic obstructive pulmonary disease, unspecified: Secondary | ICD-10-CM | POA: Diagnosis not present

## 2017-09-28 DIAGNOSIS — N186 End stage renal disease: Secondary | ICD-10-CM | POA: Diagnosis not present

## 2017-09-28 DIAGNOSIS — Z79899 Other long term (current) drug therapy: Secondary | ICD-10-CM | POA: Diagnosis not present

## 2017-09-28 DIAGNOSIS — N2581 Secondary hyperparathyroidism of renal origin: Secondary | ICD-10-CM | POA: Diagnosis not present

## 2017-09-30 DIAGNOSIS — N186 End stage renal disease: Secondary | ICD-10-CM | POA: Diagnosis not present

## 2017-09-30 DIAGNOSIS — N2581 Secondary hyperparathyroidism of renal origin: Secondary | ICD-10-CM | POA: Diagnosis not present

## 2017-09-30 DIAGNOSIS — Z79899 Other long term (current) drug therapy: Secondary | ICD-10-CM | POA: Diagnosis not present

## 2017-10-01 DIAGNOSIS — Z79899 Other long term (current) drug therapy: Secondary | ICD-10-CM | POA: Diagnosis not present

## 2017-10-01 DIAGNOSIS — N186 End stage renal disease: Secondary | ICD-10-CM | POA: Diagnosis not present

## 2017-10-01 DIAGNOSIS — N2581 Secondary hyperparathyroidism of renal origin: Secondary | ICD-10-CM | POA: Diagnosis not present

## 2017-10-03 DIAGNOSIS — N2581 Secondary hyperparathyroidism of renal origin: Secondary | ICD-10-CM | POA: Diagnosis not present

## 2017-10-03 DIAGNOSIS — Z79899 Other long term (current) drug therapy: Secondary | ICD-10-CM | POA: Diagnosis not present

## 2017-10-03 DIAGNOSIS — N186 End stage renal disease: Secondary | ICD-10-CM | POA: Diagnosis not present

## 2017-10-04 DIAGNOSIS — N186 End stage renal disease: Secondary | ICD-10-CM | POA: Diagnosis not present

## 2017-10-04 DIAGNOSIS — Z79899 Other long term (current) drug therapy: Secondary | ICD-10-CM | POA: Diagnosis not present

## 2017-10-04 DIAGNOSIS — N2581 Secondary hyperparathyroidism of renal origin: Secondary | ICD-10-CM | POA: Diagnosis not present

## 2017-10-06 DIAGNOSIS — N186 End stage renal disease: Secondary | ICD-10-CM | POA: Diagnosis not present

## 2017-10-06 DIAGNOSIS — N2581 Secondary hyperparathyroidism of renal origin: Secondary | ICD-10-CM | POA: Diagnosis not present

## 2017-10-06 DIAGNOSIS — Z79899 Other long term (current) drug therapy: Secondary | ICD-10-CM | POA: Diagnosis not present

## 2017-10-07 DIAGNOSIS — N2581 Secondary hyperparathyroidism of renal origin: Secondary | ICD-10-CM | POA: Diagnosis not present

## 2017-10-07 DIAGNOSIS — Z79899 Other long term (current) drug therapy: Secondary | ICD-10-CM | POA: Diagnosis not present

## 2017-10-07 DIAGNOSIS — N186 End stage renal disease: Secondary | ICD-10-CM | POA: Diagnosis not present

## 2017-10-08 DIAGNOSIS — N186 End stage renal disease: Secondary | ICD-10-CM | POA: Diagnosis not present

## 2017-10-08 DIAGNOSIS — N059 Unspecified nephritic syndrome with unspecified morphologic changes: Secondary | ICD-10-CM | POA: Diagnosis not present

## 2017-10-08 DIAGNOSIS — Z992 Dependence on renal dialysis: Secondary | ICD-10-CM | POA: Diagnosis not present

## 2017-10-09 DIAGNOSIS — N186 End stage renal disease: Secondary | ICD-10-CM | POA: Diagnosis not present

## 2017-10-09 DIAGNOSIS — N2581 Secondary hyperparathyroidism of renal origin: Secondary | ICD-10-CM | POA: Diagnosis not present

## 2017-10-09 DIAGNOSIS — Z4931 Encounter for adequacy testing for hemodialysis: Secondary | ICD-10-CM | POA: Diagnosis not present

## 2017-10-09 DIAGNOSIS — Z79899 Other long term (current) drug therapy: Secondary | ICD-10-CM | POA: Diagnosis not present

## 2017-10-09 DIAGNOSIS — D631 Anemia in chronic kidney disease: Secondary | ICD-10-CM | POA: Diagnosis not present

## 2017-10-09 DIAGNOSIS — D509 Iron deficiency anemia, unspecified: Secondary | ICD-10-CM | POA: Diagnosis not present

## 2017-10-10 DIAGNOSIS — Z4931 Encounter for adequacy testing for hemodialysis: Secondary | ICD-10-CM | POA: Diagnosis not present

## 2017-10-10 DIAGNOSIS — D509 Iron deficiency anemia, unspecified: Secondary | ICD-10-CM | POA: Diagnosis not present

## 2017-10-10 DIAGNOSIS — Z79899 Other long term (current) drug therapy: Secondary | ICD-10-CM | POA: Diagnosis not present

## 2017-10-10 DIAGNOSIS — D631 Anemia in chronic kidney disease: Secondary | ICD-10-CM | POA: Diagnosis not present

## 2017-10-10 DIAGNOSIS — N2581 Secondary hyperparathyroidism of renal origin: Secondary | ICD-10-CM | POA: Diagnosis not present

## 2017-10-10 DIAGNOSIS — N186 End stage renal disease: Secondary | ICD-10-CM | POA: Diagnosis not present

## 2017-10-11 DIAGNOSIS — R197 Diarrhea, unspecified: Secondary | ICD-10-CM | POA: Diagnosis not present

## 2017-10-11 DIAGNOSIS — B0229 Other postherpetic nervous system involvement: Secondary | ICD-10-CM | POA: Diagnosis not present

## 2017-10-11 DIAGNOSIS — R600 Localized edema: Secondary | ICD-10-CM | POA: Diagnosis not present

## 2017-10-11 DIAGNOSIS — K219 Gastro-esophageal reflux disease without esophagitis: Secondary | ICD-10-CM | POA: Diagnosis not present

## 2017-10-11 DIAGNOSIS — R945 Abnormal results of liver function studies: Secondary | ICD-10-CM | POA: Diagnosis not present

## 2017-10-11 DIAGNOSIS — E1121 Type 2 diabetes mellitus with diabetic nephropathy: Secondary | ICD-10-CM | POA: Diagnosis not present

## 2017-10-11 DIAGNOSIS — E039 Hypothyroidism, unspecified: Secondary | ICD-10-CM | POA: Diagnosis not present

## 2017-10-11 DIAGNOSIS — J449 Chronic obstructive pulmonary disease, unspecified: Secondary | ICD-10-CM | POA: Diagnosis not present

## 2017-10-11 DIAGNOSIS — N186 End stage renal disease: Secondary | ICD-10-CM | POA: Diagnosis not present

## 2017-10-11 DIAGNOSIS — I82409 Acute embolism and thrombosis of unspecified deep veins of unspecified lower extremity: Secondary | ICD-10-CM | POA: Diagnosis not present

## 2017-10-11 DIAGNOSIS — M545 Low back pain: Secondary | ICD-10-CM | POA: Diagnosis not present

## 2017-10-11 DIAGNOSIS — E785 Hyperlipidemia, unspecified: Secondary | ICD-10-CM | POA: Diagnosis not present

## 2017-10-12 DIAGNOSIS — D631 Anemia in chronic kidney disease: Secondary | ICD-10-CM | POA: Diagnosis not present

## 2017-10-12 DIAGNOSIS — Z4931 Encounter for adequacy testing for hemodialysis: Secondary | ICD-10-CM | POA: Diagnosis not present

## 2017-10-12 DIAGNOSIS — D509 Iron deficiency anemia, unspecified: Secondary | ICD-10-CM | POA: Diagnosis not present

## 2017-10-12 DIAGNOSIS — N186 End stage renal disease: Secondary | ICD-10-CM | POA: Diagnosis not present

## 2017-10-12 DIAGNOSIS — Z79899 Other long term (current) drug therapy: Secondary | ICD-10-CM | POA: Diagnosis not present

## 2017-10-12 DIAGNOSIS — N2581 Secondary hyperparathyroidism of renal origin: Secondary | ICD-10-CM | POA: Diagnosis not present

## 2017-10-13 DIAGNOSIS — Z4931 Encounter for adequacy testing for hemodialysis: Secondary | ICD-10-CM | POA: Diagnosis not present

## 2017-10-13 DIAGNOSIS — D509 Iron deficiency anemia, unspecified: Secondary | ICD-10-CM | POA: Diagnosis not present

## 2017-10-13 DIAGNOSIS — N186 End stage renal disease: Secondary | ICD-10-CM | POA: Diagnosis not present

## 2017-10-13 DIAGNOSIS — D631 Anemia in chronic kidney disease: Secondary | ICD-10-CM | POA: Diagnosis not present

## 2017-10-13 DIAGNOSIS — N2581 Secondary hyperparathyroidism of renal origin: Secondary | ICD-10-CM | POA: Diagnosis not present

## 2017-10-13 DIAGNOSIS — Z79899 Other long term (current) drug therapy: Secondary | ICD-10-CM | POA: Diagnosis not present

## 2017-10-17 DIAGNOSIS — Z1331 Encounter for screening for depression: Secondary | ICD-10-CM | POA: Diagnosis not present

## 2017-10-17 DIAGNOSIS — Z9181 History of falling: Secondary | ICD-10-CM | POA: Diagnosis not present

## 2017-10-17 DIAGNOSIS — E785 Hyperlipidemia, unspecified: Secondary | ICD-10-CM | POA: Diagnosis not present

## 2017-10-17 DIAGNOSIS — Z139 Encounter for screening, unspecified: Secondary | ICD-10-CM | POA: Diagnosis not present

## 2017-10-17 DIAGNOSIS — Z0000 Encounter for general adult medical examination without abnormal findings: Secondary | ICD-10-CM | POA: Diagnosis not present

## 2017-10-18 DIAGNOSIS — Z4931 Encounter for adequacy testing for hemodialysis: Secondary | ICD-10-CM | POA: Diagnosis not present

## 2017-10-18 DIAGNOSIS — N186 End stage renal disease: Secondary | ICD-10-CM | POA: Diagnosis not present

## 2017-10-18 DIAGNOSIS — D509 Iron deficiency anemia, unspecified: Secondary | ICD-10-CM | POA: Diagnosis not present

## 2017-10-18 DIAGNOSIS — D631 Anemia in chronic kidney disease: Secondary | ICD-10-CM | POA: Diagnosis not present

## 2017-10-18 DIAGNOSIS — Z79899 Other long term (current) drug therapy: Secondary | ICD-10-CM | POA: Diagnosis not present

## 2017-10-18 DIAGNOSIS — N2581 Secondary hyperparathyroidism of renal origin: Secondary | ICD-10-CM | POA: Diagnosis not present

## 2017-10-19 DIAGNOSIS — D631 Anemia in chronic kidney disease: Secondary | ICD-10-CM | POA: Diagnosis not present

## 2017-10-19 DIAGNOSIS — Z79899 Other long term (current) drug therapy: Secondary | ICD-10-CM | POA: Diagnosis not present

## 2017-10-19 DIAGNOSIS — Z4931 Encounter for adequacy testing for hemodialysis: Secondary | ICD-10-CM | POA: Diagnosis not present

## 2017-10-19 DIAGNOSIS — N186 End stage renal disease: Secondary | ICD-10-CM | POA: Diagnosis not present

## 2017-10-19 DIAGNOSIS — N2581 Secondary hyperparathyroidism of renal origin: Secondary | ICD-10-CM | POA: Diagnosis not present

## 2017-10-19 DIAGNOSIS — D509 Iron deficiency anemia, unspecified: Secondary | ICD-10-CM | POA: Diagnosis not present

## 2017-10-21 DIAGNOSIS — N2581 Secondary hyperparathyroidism of renal origin: Secondary | ICD-10-CM | POA: Diagnosis not present

## 2017-10-21 DIAGNOSIS — D631 Anemia in chronic kidney disease: Secondary | ICD-10-CM | POA: Diagnosis not present

## 2017-10-21 DIAGNOSIS — D509 Iron deficiency anemia, unspecified: Secondary | ICD-10-CM | POA: Diagnosis not present

## 2017-10-21 DIAGNOSIS — Z4931 Encounter for adequacy testing for hemodialysis: Secondary | ICD-10-CM | POA: Diagnosis not present

## 2017-10-21 DIAGNOSIS — Z79899 Other long term (current) drug therapy: Secondary | ICD-10-CM | POA: Diagnosis not present

## 2017-10-21 DIAGNOSIS — N186 End stage renal disease: Secondary | ICD-10-CM | POA: Diagnosis not present

## 2017-10-22 DIAGNOSIS — N2581 Secondary hyperparathyroidism of renal origin: Secondary | ICD-10-CM | POA: Diagnosis not present

## 2017-10-22 DIAGNOSIS — Z4931 Encounter for adequacy testing for hemodialysis: Secondary | ICD-10-CM | POA: Diagnosis not present

## 2017-10-22 DIAGNOSIS — Z79899 Other long term (current) drug therapy: Secondary | ICD-10-CM | POA: Diagnosis not present

## 2017-10-22 DIAGNOSIS — D509 Iron deficiency anemia, unspecified: Secondary | ICD-10-CM | POA: Diagnosis not present

## 2017-10-22 DIAGNOSIS — N186 End stage renal disease: Secondary | ICD-10-CM | POA: Diagnosis not present

## 2017-10-22 DIAGNOSIS — D631 Anemia in chronic kidney disease: Secondary | ICD-10-CM | POA: Diagnosis not present

## 2017-10-23 DIAGNOSIS — Z79899 Other long term (current) drug therapy: Secondary | ICD-10-CM | POA: Diagnosis not present

## 2017-10-23 DIAGNOSIS — Z4931 Encounter for adequacy testing for hemodialysis: Secondary | ICD-10-CM | POA: Diagnosis not present

## 2017-10-23 DIAGNOSIS — N186 End stage renal disease: Secondary | ICD-10-CM | POA: Diagnosis not present

## 2017-10-23 DIAGNOSIS — D631 Anemia in chronic kidney disease: Secondary | ICD-10-CM | POA: Diagnosis not present

## 2017-10-23 DIAGNOSIS — N2581 Secondary hyperparathyroidism of renal origin: Secondary | ICD-10-CM | POA: Diagnosis not present

## 2017-10-23 DIAGNOSIS — D509 Iron deficiency anemia, unspecified: Secondary | ICD-10-CM | POA: Diagnosis not present

## 2017-10-24 DIAGNOSIS — D509 Iron deficiency anemia, unspecified: Secondary | ICD-10-CM | POA: Diagnosis not present

## 2017-10-24 DIAGNOSIS — N2581 Secondary hyperparathyroidism of renal origin: Secondary | ICD-10-CM | POA: Diagnosis not present

## 2017-10-24 DIAGNOSIS — D631 Anemia in chronic kidney disease: Secondary | ICD-10-CM | POA: Diagnosis not present

## 2017-10-24 DIAGNOSIS — H26493 Other secondary cataract, bilateral: Secondary | ICD-10-CM | POA: Diagnosis not present

## 2017-10-24 DIAGNOSIS — Z4931 Encounter for adequacy testing for hemodialysis: Secondary | ICD-10-CM | POA: Diagnosis not present

## 2017-10-24 DIAGNOSIS — N186 End stage renal disease: Secondary | ICD-10-CM | POA: Diagnosis not present

## 2017-10-24 DIAGNOSIS — Z79899 Other long term (current) drug therapy: Secondary | ICD-10-CM | POA: Diagnosis not present

## 2017-10-25 DIAGNOSIS — Z4931 Encounter for adequacy testing for hemodialysis: Secondary | ICD-10-CM | POA: Diagnosis not present

## 2017-10-25 DIAGNOSIS — N2581 Secondary hyperparathyroidism of renal origin: Secondary | ICD-10-CM | POA: Diagnosis not present

## 2017-10-25 DIAGNOSIS — D509 Iron deficiency anemia, unspecified: Secondary | ICD-10-CM | POA: Diagnosis not present

## 2017-10-25 DIAGNOSIS — D631 Anemia in chronic kidney disease: Secondary | ICD-10-CM | POA: Diagnosis not present

## 2017-10-25 DIAGNOSIS — N186 End stage renal disease: Secondary | ICD-10-CM | POA: Diagnosis not present

## 2017-10-25 DIAGNOSIS — Z79899 Other long term (current) drug therapy: Secondary | ICD-10-CM | POA: Diagnosis not present

## 2017-10-27 DIAGNOSIS — N186 End stage renal disease: Secondary | ICD-10-CM | POA: Diagnosis not present

## 2017-10-27 DIAGNOSIS — N2581 Secondary hyperparathyroidism of renal origin: Secondary | ICD-10-CM | POA: Diagnosis not present

## 2017-10-27 DIAGNOSIS — D509 Iron deficiency anemia, unspecified: Secondary | ICD-10-CM | POA: Diagnosis not present

## 2017-10-27 DIAGNOSIS — Z4931 Encounter for adequacy testing for hemodialysis: Secondary | ICD-10-CM | POA: Diagnosis not present

## 2017-10-27 DIAGNOSIS — Z79899 Other long term (current) drug therapy: Secondary | ICD-10-CM | POA: Diagnosis not present

## 2017-10-27 DIAGNOSIS — D631 Anemia in chronic kidney disease: Secondary | ICD-10-CM | POA: Diagnosis not present

## 2017-10-28 DIAGNOSIS — Z4931 Encounter for adequacy testing for hemodialysis: Secondary | ICD-10-CM | POA: Diagnosis not present

## 2017-10-28 DIAGNOSIS — D631 Anemia in chronic kidney disease: Secondary | ICD-10-CM | POA: Diagnosis not present

## 2017-10-28 DIAGNOSIS — D509 Iron deficiency anemia, unspecified: Secondary | ICD-10-CM | POA: Diagnosis not present

## 2017-10-28 DIAGNOSIS — Z79899 Other long term (current) drug therapy: Secondary | ICD-10-CM | POA: Diagnosis not present

## 2017-10-28 DIAGNOSIS — N186 End stage renal disease: Secondary | ICD-10-CM | POA: Diagnosis not present

## 2017-10-28 DIAGNOSIS — N2581 Secondary hyperparathyroidism of renal origin: Secondary | ICD-10-CM | POA: Diagnosis not present

## 2017-10-30 DIAGNOSIS — N2581 Secondary hyperparathyroidism of renal origin: Secondary | ICD-10-CM | POA: Diagnosis not present

## 2017-10-30 DIAGNOSIS — Z4931 Encounter for adequacy testing for hemodialysis: Secondary | ICD-10-CM | POA: Diagnosis not present

## 2017-10-30 DIAGNOSIS — D631 Anemia in chronic kidney disease: Secondary | ICD-10-CM | POA: Diagnosis not present

## 2017-10-30 DIAGNOSIS — D509 Iron deficiency anemia, unspecified: Secondary | ICD-10-CM | POA: Diagnosis not present

## 2017-10-30 DIAGNOSIS — Z79899 Other long term (current) drug therapy: Secondary | ICD-10-CM | POA: Diagnosis not present

## 2017-10-30 DIAGNOSIS — N186 End stage renal disease: Secondary | ICD-10-CM | POA: Diagnosis not present

## 2017-10-31 DIAGNOSIS — N186 End stage renal disease: Secondary | ICD-10-CM | POA: Diagnosis not present

## 2017-10-31 DIAGNOSIS — Z79899 Other long term (current) drug therapy: Secondary | ICD-10-CM | POA: Diagnosis not present

## 2017-10-31 DIAGNOSIS — D631 Anemia in chronic kidney disease: Secondary | ICD-10-CM | POA: Diagnosis not present

## 2017-10-31 DIAGNOSIS — N2581 Secondary hyperparathyroidism of renal origin: Secondary | ICD-10-CM | POA: Diagnosis not present

## 2017-10-31 DIAGNOSIS — D509 Iron deficiency anemia, unspecified: Secondary | ICD-10-CM | POA: Diagnosis not present

## 2017-10-31 DIAGNOSIS — Z4931 Encounter for adequacy testing for hemodialysis: Secondary | ICD-10-CM | POA: Diagnosis not present

## 2017-11-02 ENCOUNTER — Ambulatory Visit (INDEPENDENT_AMBULATORY_CARE_PROVIDER_SITE_OTHER): Payer: Medicare Other | Admitting: Sports Medicine

## 2017-11-02 ENCOUNTER — Encounter: Payer: Self-pay | Admitting: Sports Medicine

## 2017-11-02 DIAGNOSIS — B351 Tinea unguium: Secondary | ICD-10-CM | POA: Diagnosis not present

## 2017-11-02 DIAGNOSIS — Z79899 Other long term (current) drug therapy: Secondary | ICD-10-CM | POA: Diagnosis not present

## 2017-11-02 DIAGNOSIS — N2581 Secondary hyperparathyroidism of renal origin: Secondary | ICD-10-CM | POA: Diagnosis not present

## 2017-11-02 DIAGNOSIS — M79675 Pain in left toe(s): Secondary | ICD-10-CM | POA: Diagnosis not present

## 2017-11-02 DIAGNOSIS — E1140 Type 2 diabetes mellitus with diabetic neuropathy, unspecified: Secondary | ICD-10-CM | POA: Diagnosis not present

## 2017-11-02 DIAGNOSIS — Z4931 Encounter for adequacy testing for hemodialysis: Secondary | ICD-10-CM | POA: Diagnosis not present

## 2017-11-02 DIAGNOSIS — M79674 Pain in right toe(s): Secondary | ICD-10-CM

## 2017-11-02 DIAGNOSIS — D509 Iron deficiency anemia, unspecified: Secondary | ICD-10-CM | POA: Diagnosis not present

## 2017-11-02 DIAGNOSIS — D631 Anemia in chronic kidney disease: Secondary | ICD-10-CM | POA: Diagnosis not present

## 2017-11-02 DIAGNOSIS — Z992 Dependence on renal dialysis: Secondary | ICD-10-CM

## 2017-11-02 DIAGNOSIS — N186 End stage renal disease: Secondary | ICD-10-CM

## 2017-11-02 NOTE — Progress Notes (Signed)
Subjective: Linda Jordan is a 84 y.o. female patient with history of diabetes who presents to office today complaining of long, painful nails  while ambulating in shoes; unable to trim. Patient states that the glucose readings are not checked and she saw Dr. Truman Hayward 3 weeks ago who reported that her A1c was 5.5.  Patient is still doing home dialysis and denies any acute changes or problems since last visit.  Patient Active Problem List   Diagnosis Date Noted  . Thyroid disease 05/28/2013  . Hypertension 05/28/2013  . Arthritis 02/16/2011  . Bilateral swelling of feet 02/16/2011  . Mycotic toenails 02/16/2011   Current Outpatient Medications on File Prior to Visit  Medication Sig Dispense Refill  . Acetaminophen (TYLENOL EX ST ARTHRITIS PAIN PO) Take by mouth.      Marland Kitchen amLODipine (NORVASC) 10 MG tablet Take 10 mg by mouth daily.      Marland Kitchen aspirin EC 81 MG tablet Take 81 mg by mouth.    Marland Kitchen atorvastatin (LIPITOR) 80 MG tablet Take 80 mg by mouth.    Marland Kitchen b complex vitamins capsule Take by mouth.    Marland Kitchen buPROPion (WELLBUTRIN SR) 100 MG 12 hr tablet TAKE 1 TABLET (100 MG TOTAL) BY MOUTH DAILY.  4  . cetirizine (ZYRTEC) 10 MG tablet 1 (ONE) TABLET BY MOUTH AT BEDTIME  12  . hydrochlorothiazide (HYDRODIURIL) 25 MG tablet Take 25 mg by mouth daily.    Marland Kitchen letrozole (FEMARA) 2.5 MG tablet 1 TABLET ONCE DAILY - TAKE DAILY FOR HORMONAL PROTECTION AGAINST FUTURE BREAST CANCER  3  . levothyroxine (SYNTHROID) 25 MCG tablet Take by mouth.    . losartan (COZAAR) 50 MG tablet TAKE 1 TABLET BY MOUTH AT BEDTIME (STOP LISINOPRIL)  11  . metoprolol (LOPRESSOR) 50 MG tablet Take 50 mg by mouth 2 (two) times daily.    . Multiple Vitamin (MULTIVITAMIN) tablet Take 1 tablet by mouth daily.    . Nebivolol HCl (BYSTOLIC) 20 MG TABS Take by mouth.      . Omega-3 Fatty Acids (FISH OIL CONCENTRATE) 300 MG CAPS Take by mouth.    Marland Kitchen omeprazole (PRILOSEC) 40 MG capsule Take 40 mg by mouth daily.      Marland Kitchen oxycodone (OXY-IR) 5 MG capsule  TAKE ONE TO TWO CAPSULES BY MOUTH EVERY 4 HOURS AS NEEDED FOR PAIN  0  . pregabalin (LYRICA) 75 MG capsule Take 75 mg by mouth 2 (two) times daily.      . rosuvastatin (CRESTOR) 10 MG tablet Take 10 mg by mouth daily.      Marland Kitchen warfarin (COUMADIN) 2 MG tablet Take 2 mg by mouth daily.      . carvedilol (COREG) 3.125 MG tablet Take 3.125 mg by mouth.    . spironolactone (ALDACTONE) 100 MG tablet Take 100 mg by mouth.     No current facility-administered medications on file prior to visit.    Allergies  Allergen Reactions  . Codeine Nausea And Vomiting  . Other   . Sulfa Antibiotics Nausea And Vomiting  . Tape     No results found for this or any previous visit (from the past 2160 hour(s)).  Objective: General: Patient is awake, alert, and oriented x 3 and in no acute distress.  Integument: Skin is warm, dry and supple bilateral. Nails are tender, long, thickened and dystrophic with subungual debris, consistent with onychomycosis, 1-5 bilateral. No signs of infection. No open lesions or preulcerative lesions present bilateral. Remaining integument unremarkable.  Vasculature:  Dorsalis Pedis pulse 1/4 bilateral. Posterior Tibial pulse  1/4 bilateral.  Capillary fill time <3 sec 1-5 bilateral. Scant hair growth to the level of the digits. Temperature gradient within normal limits. Trace varicosities present bilateral. No edema present bilateral.   Neurology: The patient has absent sensation measured with a 5.07/10g Semmes Weinstein Monofilament at all pedal sites bilateral . Vibratory sensation absent bilateral with tuning fork. No Babinski sign present bilateral.   Musculoskeletal: Asymptomatic hammertoes pedal deformities noted bilateral. Muscular strength 5/5 in all lower extremity muscular groups bilateral without pain on range of motion . No tenderness with calf compression bilateral.  Assessment and Plan: Problem List Items Addressed This Visit    None    Visit Diagnoses    Pain  due to onychomycosis of toenails of both feet    -  Primary   Type 2 diabetes, controlled, with neuropathy ([place])       ESRD on hemodialysis ([place])         -Examined patient. -Mechanically debrided all nails 1-5 bilateral using sterile nail nipper and filed with dremel without incident -Patient to return  in 3 months for at risk foot care -Patient advised to call the office if any problems or questions arise in the meantime.  Landis Martins, DPM

## 2017-11-05 DIAGNOSIS — N186 End stage renal disease: Secondary | ICD-10-CM | POA: Diagnosis not present

## 2017-11-05 DIAGNOSIS — N2581 Secondary hyperparathyroidism of renal origin: Secondary | ICD-10-CM | POA: Diagnosis not present

## 2017-11-05 DIAGNOSIS — Z79899 Other long term (current) drug therapy: Secondary | ICD-10-CM | POA: Diagnosis not present

## 2017-11-05 DIAGNOSIS — D509 Iron deficiency anemia, unspecified: Secondary | ICD-10-CM | POA: Diagnosis not present

## 2017-11-05 DIAGNOSIS — Z4931 Encounter for adequacy testing for hemodialysis: Secondary | ICD-10-CM | POA: Diagnosis not present

## 2017-11-05 DIAGNOSIS — D631 Anemia in chronic kidney disease: Secondary | ICD-10-CM | POA: Diagnosis not present

## 2017-11-06 DIAGNOSIS — N2581 Secondary hyperparathyroidism of renal origin: Secondary | ICD-10-CM | POA: Diagnosis not present

## 2017-11-06 DIAGNOSIS — D631 Anemia in chronic kidney disease: Secondary | ICD-10-CM | POA: Diagnosis not present

## 2017-11-06 DIAGNOSIS — D509 Iron deficiency anemia, unspecified: Secondary | ICD-10-CM | POA: Diagnosis not present

## 2017-11-06 DIAGNOSIS — Z4931 Encounter for adequacy testing for hemodialysis: Secondary | ICD-10-CM | POA: Diagnosis not present

## 2017-11-06 DIAGNOSIS — N186 End stage renal disease: Secondary | ICD-10-CM | POA: Diagnosis not present

## 2017-11-06 DIAGNOSIS — Z79899 Other long term (current) drug therapy: Secondary | ICD-10-CM | POA: Diagnosis not present

## 2017-11-07 DIAGNOSIS — N186 End stage renal disease: Secondary | ICD-10-CM | POA: Diagnosis not present

## 2017-11-07 DIAGNOSIS — E1121 Type 2 diabetes mellitus with diabetic nephropathy: Secondary | ICD-10-CM | POA: Diagnosis not present

## 2017-11-07 DIAGNOSIS — J449 Chronic obstructive pulmonary disease, unspecified: Secondary | ICD-10-CM | POA: Diagnosis not present

## 2017-11-07 DIAGNOSIS — I82409 Acute embolism and thrombosis of unspecified deep veins of unspecified lower extremity: Secondary | ICD-10-CM | POA: Diagnosis not present

## 2017-11-08 DIAGNOSIS — N2581 Secondary hyperparathyroidism of renal origin: Secondary | ICD-10-CM | POA: Diagnosis not present

## 2017-11-08 DIAGNOSIS — N186 End stage renal disease: Secondary | ICD-10-CM | POA: Diagnosis not present

## 2017-11-08 DIAGNOSIS — D631 Anemia in chronic kidney disease: Secondary | ICD-10-CM | POA: Diagnosis not present

## 2017-11-08 DIAGNOSIS — D509 Iron deficiency anemia, unspecified: Secondary | ICD-10-CM | POA: Diagnosis not present

## 2017-11-08 DIAGNOSIS — Z79899 Other long term (current) drug therapy: Secondary | ICD-10-CM | POA: Diagnosis not present

## 2017-11-09 DIAGNOSIS — D631 Anemia in chronic kidney disease: Secondary | ICD-10-CM | POA: Diagnosis not present

## 2017-11-09 DIAGNOSIS — Z79899 Other long term (current) drug therapy: Secondary | ICD-10-CM | POA: Diagnosis not present

## 2017-11-09 DIAGNOSIS — N186 End stage renal disease: Secondary | ICD-10-CM | POA: Diagnosis not present

## 2017-11-09 DIAGNOSIS — N2581 Secondary hyperparathyroidism of renal origin: Secondary | ICD-10-CM | POA: Diagnosis not present

## 2017-11-09 DIAGNOSIS — D509 Iron deficiency anemia, unspecified: Secondary | ICD-10-CM | POA: Diagnosis not present

## 2017-11-11 DIAGNOSIS — N2581 Secondary hyperparathyroidism of renal origin: Secondary | ICD-10-CM | POA: Diagnosis not present

## 2017-11-11 DIAGNOSIS — D631 Anemia in chronic kidney disease: Secondary | ICD-10-CM | POA: Diagnosis not present

## 2017-11-11 DIAGNOSIS — N186 End stage renal disease: Secondary | ICD-10-CM | POA: Diagnosis not present

## 2017-11-11 DIAGNOSIS — Z79899 Other long term (current) drug therapy: Secondary | ICD-10-CM | POA: Diagnosis not present

## 2017-11-11 DIAGNOSIS — D509 Iron deficiency anemia, unspecified: Secondary | ICD-10-CM | POA: Diagnosis not present

## 2017-11-12 DIAGNOSIS — N186 End stage renal disease: Secondary | ICD-10-CM | POA: Diagnosis not present

## 2017-11-12 DIAGNOSIS — D509 Iron deficiency anemia, unspecified: Secondary | ICD-10-CM | POA: Diagnosis not present

## 2017-11-12 DIAGNOSIS — Z79899 Other long term (current) drug therapy: Secondary | ICD-10-CM | POA: Diagnosis not present

## 2017-11-12 DIAGNOSIS — D631 Anemia in chronic kidney disease: Secondary | ICD-10-CM | POA: Diagnosis not present

## 2017-11-12 DIAGNOSIS — N2581 Secondary hyperparathyroidism of renal origin: Secondary | ICD-10-CM | POA: Diagnosis not present

## 2017-11-13 DIAGNOSIS — Z79899 Other long term (current) drug therapy: Secondary | ICD-10-CM | POA: Diagnosis not present

## 2017-11-13 DIAGNOSIS — D509 Iron deficiency anemia, unspecified: Secondary | ICD-10-CM | POA: Diagnosis not present

## 2017-11-13 DIAGNOSIS — D631 Anemia in chronic kidney disease: Secondary | ICD-10-CM | POA: Diagnosis not present

## 2017-11-13 DIAGNOSIS — N186 End stage renal disease: Secondary | ICD-10-CM | POA: Diagnosis not present

## 2017-11-13 DIAGNOSIS — N2581 Secondary hyperparathyroidism of renal origin: Secondary | ICD-10-CM | POA: Diagnosis not present

## 2017-11-14 DIAGNOSIS — N2581 Secondary hyperparathyroidism of renal origin: Secondary | ICD-10-CM | POA: Diagnosis not present

## 2017-11-14 DIAGNOSIS — N186 End stage renal disease: Secondary | ICD-10-CM | POA: Diagnosis not present

## 2017-11-14 DIAGNOSIS — D631 Anemia in chronic kidney disease: Secondary | ICD-10-CM | POA: Diagnosis not present

## 2017-11-14 DIAGNOSIS — Z79899 Other long term (current) drug therapy: Secondary | ICD-10-CM | POA: Diagnosis not present

## 2017-11-14 DIAGNOSIS — D509 Iron deficiency anemia, unspecified: Secondary | ICD-10-CM | POA: Diagnosis not present

## 2017-11-15 DIAGNOSIS — D631 Anemia in chronic kidney disease: Secondary | ICD-10-CM | POA: Diagnosis not present

## 2017-11-15 DIAGNOSIS — D509 Iron deficiency anemia, unspecified: Secondary | ICD-10-CM | POA: Diagnosis not present

## 2017-11-15 DIAGNOSIS — N186 End stage renal disease: Secondary | ICD-10-CM | POA: Diagnosis not present

## 2017-11-15 DIAGNOSIS — Z79899 Other long term (current) drug therapy: Secondary | ICD-10-CM | POA: Diagnosis not present

## 2017-11-15 DIAGNOSIS — N2581 Secondary hyperparathyroidism of renal origin: Secondary | ICD-10-CM | POA: Diagnosis not present

## 2017-11-17 DIAGNOSIS — Z79899 Other long term (current) drug therapy: Secondary | ICD-10-CM | POA: Diagnosis not present

## 2017-11-17 DIAGNOSIS — D631 Anemia in chronic kidney disease: Secondary | ICD-10-CM | POA: Diagnosis not present

## 2017-11-17 DIAGNOSIS — N2581 Secondary hyperparathyroidism of renal origin: Secondary | ICD-10-CM | POA: Diagnosis not present

## 2017-11-17 DIAGNOSIS — D509 Iron deficiency anemia, unspecified: Secondary | ICD-10-CM | POA: Diagnosis not present

## 2017-11-17 DIAGNOSIS — N186 End stage renal disease: Secondary | ICD-10-CM | POA: Diagnosis not present

## 2017-11-18 DIAGNOSIS — N2581 Secondary hyperparathyroidism of renal origin: Secondary | ICD-10-CM | POA: Diagnosis not present

## 2017-11-18 DIAGNOSIS — N186 End stage renal disease: Secondary | ICD-10-CM | POA: Diagnosis not present

## 2017-11-18 DIAGNOSIS — Z79899 Other long term (current) drug therapy: Secondary | ICD-10-CM | POA: Diagnosis not present

## 2017-11-18 DIAGNOSIS — D509 Iron deficiency anemia, unspecified: Secondary | ICD-10-CM | POA: Diagnosis not present

## 2017-11-18 DIAGNOSIS — D631 Anemia in chronic kidney disease: Secondary | ICD-10-CM | POA: Diagnosis not present

## 2017-11-20 DIAGNOSIS — N186 End stage renal disease: Secondary | ICD-10-CM | POA: Diagnosis not present

## 2017-11-20 DIAGNOSIS — N2581 Secondary hyperparathyroidism of renal origin: Secondary | ICD-10-CM | POA: Diagnosis not present

## 2017-11-20 DIAGNOSIS — Z79899 Other long term (current) drug therapy: Secondary | ICD-10-CM | POA: Diagnosis not present

## 2017-11-20 DIAGNOSIS — D509 Iron deficiency anemia, unspecified: Secondary | ICD-10-CM | POA: Diagnosis not present

## 2017-11-20 DIAGNOSIS — D631 Anemia in chronic kidney disease: Secondary | ICD-10-CM | POA: Diagnosis not present

## 2017-11-21 DIAGNOSIS — N186 End stage renal disease: Secondary | ICD-10-CM | POA: Diagnosis not present

## 2017-11-21 DIAGNOSIS — N2581 Secondary hyperparathyroidism of renal origin: Secondary | ICD-10-CM | POA: Diagnosis not present

## 2017-11-21 DIAGNOSIS — Z79899 Other long term (current) drug therapy: Secondary | ICD-10-CM | POA: Diagnosis not present

## 2017-11-21 DIAGNOSIS — D631 Anemia in chronic kidney disease: Secondary | ICD-10-CM | POA: Diagnosis not present

## 2017-11-21 DIAGNOSIS — D509 Iron deficiency anemia, unspecified: Secondary | ICD-10-CM | POA: Diagnosis not present

## 2017-11-23 DIAGNOSIS — D509 Iron deficiency anemia, unspecified: Secondary | ICD-10-CM | POA: Diagnosis not present

## 2017-11-23 DIAGNOSIS — Z79899 Other long term (current) drug therapy: Secondary | ICD-10-CM | POA: Diagnosis not present

## 2017-11-23 DIAGNOSIS — D631 Anemia in chronic kidney disease: Secondary | ICD-10-CM | POA: Diagnosis not present

## 2017-11-23 DIAGNOSIS — N2581 Secondary hyperparathyroidism of renal origin: Secondary | ICD-10-CM | POA: Diagnosis not present

## 2017-11-23 DIAGNOSIS — N186 End stage renal disease: Secondary | ICD-10-CM | POA: Diagnosis not present

## 2017-11-24 DIAGNOSIS — D631 Anemia in chronic kidney disease: Secondary | ICD-10-CM | POA: Diagnosis not present

## 2017-11-24 DIAGNOSIS — D509 Iron deficiency anemia, unspecified: Secondary | ICD-10-CM | POA: Diagnosis not present

## 2017-11-24 DIAGNOSIS — N2581 Secondary hyperparathyroidism of renal origin: Secondary | ICD-10-CM | POA: Diagnosis not present

## 2017-11-24 DIAGNOSIS — Z79899 Other long term (current) drug therapy: Secondary | ICD-10-CM | POA: Diagnosis not present

## 2017-11-24 DIAGNOSIS — N186 End stage renal disease: Secondary | ICD-10-CM | POA: Diagnosis not present

## 2017-11-26 DIAGNOSIS — N186 End stage renal disease: Secondary | ICD-10-CM | POA: Diagnosis not present

## 2017-11-26 DIAGNOSIS — D631 Anemia in chronic kidney disease: Secondary | ICD-10-CM | POA: Diagnosis not present

## 2017-11-26 DIAGNOSIS — D509 Iron deficiency anemia, unspecified: Secondary | ICD-10-CM | POA: Diagnosis not present

## 2017-11-26 DIAGNOSIS — N2581 Secondary hyperparathyroidism of renal origin: Secondary | ICD-10-CM | POA: Diagnosis not present

## 2017-11-26 DIAGNOSIS — Z79899 Other long term (current) drug therapy: Secondary | ICD-10-CM | POA: Diagnosis not present

## 2017-11-27 DIAGNOSIS — D509 Iron deficiency anemia, unspecified: Secondary | ICD-10-CM | POA: Diagnosis not present

## 2017-11-27 DIAGNOSIS — Z79899 Other long term (current) drug therapy: Secondary | ICD-10-CM | POA: Diagnosis not present

## 2017-11-27 DIAGNOSIS — D631 Anemia in chronic kidney disease: Secondary | ICD-10-CM | POA: Diagnosis not present

## 2017-11-27 DIAGNOSIS — N2581 Secondary hyperparathyroidism of renal origin: Secondary | ICD-10-CM | POA: Diagnosis not present

## 2017-11-27 DIAGNOSIS — N186 End stage renal disease: Secondary | ICD-10-CM | POA: Diagnosis not present

## 2017-11-29 DIAGNOSIS — N186 End stage renal disease: Secondary | ICD-10-CM | POA: Diagnosis not present

## 2017-11-29 DIAGNOSIS — D509 Iron deficiency anemia, unspecified: Secondary | ICD-10-CM | POA: Diagnosis not present

## 2017-11-29 DIAGNOSIS — N2581 Secondary hyperparathyroidism of renal origin: Secondary | ICD-10-CM | POA: Diagnosis not present

## 2017-11-29 DIAGNOSIS — Z79899 Other long term (current) drug therapy: Secondary | ICD-10-CM | POA: Diagnosis not present

## 2017-11-29 DIAGNOSIS — D631 Anemia in chronic kidney disease: Secondary | ICD-10-CM | POA: Diagnosis not present

## 2017-11-30 DIAGNOSIS — N2581 Secondary hyperparathyroidism of renal origin: Secondary | ICD-10-CM | POA: Diagnosis not present

## 2017-11-30 DIAGNOSIS — D509 Iron deficiency anemia, unspecified: Secondary | ICD-10-CM | POA: Diagnosis not present

## 2017-11-30 DIAGNOSIS — D631 Anemia in chronic kidney disease: Secondary | ICD-10-CM | POA: Diagnosis not present

## 2017-11-30 DIAGNOSIS — Z79899 Other long term (current) drug therapy: Secondary | ICD-10-CM | POA: Diagnosis not present

## 2017-11-30 DIAGNOSIS — N186 End stage renal disease: Secondary | ICD-10-CM | POA: Diagnosis not present

## 2017-12-02 DIAGNOSIS — N186 End stage renal disease: Secondary | ICD-10-CM | POA: Diagnosis not present

## 2017-12-02 DIAGNOSIS — D631 Anemia in chronic kidney disease: Secondary | ICD-10-CM | POA: Diagnosis not present

## 2017-12-02 DIAGNOSIS — N2581 Secondary hyperparathyroidism of renal origin: Secondary | ICD-10-CM | POA: Diagnosis not present

## 2017-12-02 DIAGNOSIS — D509 Iron deficiency anemia, unspecified: Secondary | ICD-10-CM | POA: Diagnosis not present

## 2017-12-02 DIAGNOSIS — Z79899 Other long term (current) drug therapy: Secondary | ICD-10-CM | POA: Diagnosis not present

## 2017-12-03 DIAGNOSIS — D631 Anemia in chronic kidney disease: Secondary | ICD-10-CM | POA: Diagnosis not present

## 2017-12-03 DIAGNOSIS — N2581 Secondary hyperparathyroidism of renal origin: Secondary | ICD-10-CM | POA: Diagnosis not present

## 2017-12-03 DIAGNOSIS — D509 Iron deficiency anemia, unspecified: Secondary | ICD-10-CM | POA: Diagnosis not present

## 2017-12-03 DIAGNOSIS — Z79899 Other long term (current) drug therapy: Secondary | ICD-10-CM | POA: Diagnosis not present

## 2017-12-03 DIAGNOSIS — N186 End stage renal disease: Secondary | ICD-10-CM | POA: Diagnosis not present

## 2017-12-05 DIAGNOSIS — D631 Anemia in chronic kidney disease: Secondary | ICD-10-CM | POA: Diagnosis not present

## 2017-12-05 DIAGNOSIS — Z79899 Other long term (current) drug therapy: Secondary | ICD-10-CM | POA: Diagnosis not present

## 2017-12-05 DIAGNOSIS — N2581 Secondary hyperparathyroidism of renal origin: Secondary | ICD-10-CM | POA: Diagnosis not present

## 2017-12-05 DIAGNOSIS — N186 End stage renal disease: Secondary | ICD-10-CM | POA: Diagnosis not present

## 2017-12-05 DIAGNOSIS — D509 Iron deficiency anemia, unspecified: Secondary | ICD-10-CM | POA: Diagnosis not present

## 2017-12-06 DIAGNOSIS — D509 Iron deficiency anemia, unspecified: Secondary | ICD-10-CM | POA: Diagnosis not present

## 2017-12-06 DIAGNOSIS — Z79899 Other long term (current) drug therapy: Secondary | ICD-10-CM | POA: Diagnosis not present

## 2017-12-06 DIAGNOSIS — N2581 Secondary hyperparathyroidism of renal origin: Secondary | ICD-10-CM | POA: Diagnosis not present

## 2017-12-06 DIAGNOSIS — D631 Anemia in chronic kidney disease: Secondary | ICD-10-CM | POA: Diagnosis not present

## 2017-12-06 DIAGNOSIS — N186 End stage renal disease: Secondary | ICD-10-CM | POA: Diagnosis not present

## 2017-12-07 DIAGNOSIS — Z992 Dependence on renal dialysis: Secondary | ICD-10-CM | POA: Diagnosis not present

## 2017-12-07 DIAGNOSIS — N186 End stage renal disease: Secondary | ICD-10-CM | POA: Diagnosis not present

## 2017-12-07 DIAGNOSIS — N059 Unspecified nephritic syndrome with unspecified morphologic changes: Secondary | ICD-10-CM | POA: Diagnosis not present

## 2017-12-08 DIAGNOSIS — N059 Unspecified nephritic syndrome with unspecified morphologic changes: Secondary | ICD-10-CM | POA: Diagnosis not present

## 2017-12-08 DIAGNOSIS — N2581 Secondary hyperparathyroidism of renal origin: Secondary | ICD-10-CM | POA: Diagnosis not present

## 2017-12-08 DIAGNOSIS — N186 End stage renal disease: Secondary | ICD-10-CM | POA: Diagnosis not present

## 2017-12-08 DIAGNOSIS — D631 Anemia in chronic kidney disease: Secondary | ICD-10-CM | POA: Diagnosis not present

## 2017-12-08 DIAGNOSIS — Z79899 Other long term (current) drug therapy: Secondary | ICD-10-CM | POA: Diagnosis not present

## 2017-12-08 DIAGNOSIS — D509 Iron deficiency anemia, unspecified: Secondary | ICD-10-CM | POA: Diagnosis not present

## 2017-12-08 DIAGNOSIS — Z992 Dependence on renal dialysis: Secondary | ICD-10-CM | POA: Diagnosis not present

## 2017-12-09 DIAGNOSIS — D509 Iron deficiency anemia, unspecified: Secondary | ICD-10-CM | POA: Diagnosis not present

## 2017-12-09 DIAGNOSIS — N186 End stage renal disease: Secondary | ICD-10-CM | POA: Diagnosis not present

## 2017-12-09 DIAGNOSIS — N2581 Secondary hyperparathyroidism of renal origin: Secondary | ICD-10-CM | POA: Diagnosis not present

## 2017-12-09 DIAGNOSIS — D631 Anemia in chronic kidney disease: Secondary | ICD-10-CM | POA: Diagnosis not present

## 2017-12-09 DIAGNOSIS — Z79899 Other long term (current) drug therapy: Secondary | ICD-10-CM | POA: Diagnosis not present

## 2017-12-11 DIAGNOSIS — D509 Iron deficiency anemia, unspecified: Secondary | ICD-10-CM | POA: Diagnosis not present

## 2017-12-11 DIAGNOSIS — N186 End stage renal disease: Secondary | ICD-10-CM | POA: Diagnosis not present

## 2017-12-11 DIAGNOSIS — N2581 Secondary hyperparathyroidism of renal origin: Secondary | ICD-10-CM | POA: Diagnosis not present

## 2017-12-11 DIAGNOSIS — D631 Anemia in chronic kidney disease: Secondary | ICD-10-CM | POA: Diagnosis not present

## 2017-12-11 DIAGNOSIS — Z79899 Other long term (current) drug therapy: Secondary | ICD-10-CM | POA: Diagnosis not present

## 2017-12-12 DIAGNOSIS — N186 End stage renal disease: Secondary | ICD-10-CM | POA: Diagnosis not present

## 2017-12-12 DIAGNOSIS — D509 Iron deficiency anemia, unspecified: Secondary | ICD-10-CM | POA: Diagnosis not present

## 2017-12-12 DIAGNOSIS — Z79899 Other long term (current) drug therapy: Secondary | ICD-10-CM | POA: Diagnosis not present

## 2017-12-12 DIAGNOSIS — D631 Anemia in chronic kidney disease: Secondary | ICD-10-CM | POA: Diagnosis not present

## 2017-12-12 DIAGNOSIS — N2581 Secondary hyperparathyroidism of renal origin: Secondary | ICD-10-CM | POA: Diagnosis not present

## 2017-12-13 DIAGNOSIS — E1121 Type 2 diabetes mellitus with diabetic nephropathy: Secondary | ICD-10-CM | POA: Diagnosis not present

## 2017-12-13 DIAGNOSIS — J449 Chronic obstructive pulmonary disease, unspecified: Secondary | ICD-10-CM | POA: Diagnosis not present

## 2017-12-13 DIAGNOSIS — I82409 Acute embolism and thrombosis of unspecified deep veins of unspecified lower extremity: Secondary | ICD-10-CM | POA: Diagnosis not present

## 2017-12-13 DIAGNOSIS — N186 End stage renal disease: Secondary | ICD-10-CM | POA: Diagnosis not present

## 2017-12-13 DIAGNOSIS — M25561 Pain in right knee: Secondary | ICD-10-CM | POA: Diagnosis not present

## 2017-12-14 DIAGNOSIS — D631 Anemia in chronic kidney disease: Secondary | ICD-10-CM | POA: Diagnosis not present

## 2017-12-14 DIAGNOSIS — D509 Iron deficiency anemia, unspecified: Secondary | ICD-10-CM | POA: Diagnosis not present

## 2017-12-14 DIAGNOSIS — N2581 Secondary hyperparathyroidism of renal origin: Secondary | ICD-10-CM | POA: Diagnosis not present

## 2017-12-14 DIAGNOSIS — Z79899 Other long term (current) drug therapy: Secondary | ICD-10-CM | POA: Diagnosis not present

## 2017-12-14 DIAGNOSIS — N186 End stage renal disease: Secondary | ICD-10-CM | POA: Diagnosis not present

## 2017-12-15 DIAGNOSIS — D631 Anemia in chronic kidney disease: Secondary | ICD-10-CM | POA: Diagnosis not present

## 2017-12-15 DIAGNOSIS — N2581 Secondary hyperparathyroidism of renal origin: Secondary | ICD-10-CM | POA: Diagnosis not present

## 2017-12-15 DIAGNOSIS — N186 End stage renal disease: Secondary | ICD-10-CM | POA: Diagnosis not present

## 2017-12-15 DIAGNOSIS — D509 Iron deficiency anemia, unspecified: Secondary | ICD-10-CM | POA: Diagnosis not present

## 2017-12-15 DIAGNOSIS — Z79899 Other long term (current) drug therapy: Secondary | ICD-10-CM | POA: Diagnosis not present

## 2017-12-17 DIAGNOSIS — N186 End stage renal disease: Secondary | ICD-10-CM | POA: Diagnosis not present

## 2017-12-17 DIAGNOSIS — N2581 Secondary hyperparathyroidism of renal origin: Secondary | ICD-10-CM | POA: Diagnosis not present

## 2017-12-17 DIAGNOSIS — D509 Iron deficiency anemia, unspecified: Secondary | ICD-10-CM | POA: Diagnosis not present

## 2017-12-17 DIAGNOSIS — D631 Anemia in chronic kidney disease: Secondary | ICD-10-CM | POA: Diagnosis not present

## 2017-12-17 DIAGNOSIS — Z79899 Other long term (current) drug therapy: Secondary | ICD-10-CM | POA: Diagnosis not present

## 2017-12-18 DIAGNOSIS — D509 Iron deficiency anemia, unspecified: Secondary | ICD-10-CM | POA: Diagnosis not present

## 2017-12-18 DIAGNOSIS — J449 Chronic obstructive pulmonary disease, unspecified: Secondary | ICD-10-CM | POA: Diagnosis not present

## 2017-12-18 DIAGNOSIS — D631 Anemia in chronic kidney disease: Secondary | ICD-10-CM | POA: Diagnosis not present

## 2017-12-18 DIAGNOSIS — M25561 Pain in right knee: Secondary | ICD-10-CM | POA: Diagnosis not present

## 2017-12-18 DIAGNOSIS — I82409 Acute embolism and thrombosis of unspecified deep veins of unspecified lower extremity: Secondary | ICD-10-CM | POA: Diagnosis not present

## 2017-12-18 DIAGNOSIS — N186 End stage renal disease: Secondary | ICD-10-CM | POA: Diagnosis not present

## 2017-12-18 DIAGNOSIS — N2581 Secondary hyperparathyroidism of renal origin: Secondary | ICD-10-CM | POA: Diagnosis not present

## 2017-12-18 DIAGNOSIS — Z79899 Other long term (current) drug therapy: Secondary | ICD-10-CM | POA: Diagnosis not present

## 2017-12-20 DIAGNOSIS — N2581 Secondary hyperparathyroidism of renal origin: Secondary | ICD-10-CM | POA: Diagnosis not present

## 2017-12-20 DIAGNOSIS — D631 Anemia in chronic kidney disease: Secondary | ICD-10-CM | POA: Diagnosis not present

## 2017-12-20 DIAGNOSIS — N186 End stage renal disease: Secondary | ICD-10-CM | POA: Diagnosis not present

## 2017-12-20 DIAGNOSIS — D509 Iron deficiency anemia, unspecified: Secondary | ICD-10-CM | POA: Diagnosis not present

## 2017-12-20 DIAGNOSIS — Z79899 Other long term (current) drug therapy: Secondary | ICD-10-CM | POA: Diagnosis not present

## 2017-12-21 DIAGNOSIS — Z79899 Other long term (current) drug therapy: Secondary | ICD-10-CM | POA: Diagnosis not present

## 2017-12-21 DIAGNOSIS — D509 Iron deficiency anemia, unspecified: Secondary | ICD-10-CM | POA: Diagnosis not present

## 2017-12-21 DIAGNOSIS — D631 Anemia in chronic kidney disease: Secondary | ICD-10-CM | POA: Diagnosis not present

## 2017-12-21 DIAGNOSIS — N186 End stage renal disease: Secondary | ICD-10-CM | POA: Diagnosis not present

## 2017-12-21 DIAGNOSIS — N2581 Secondary hyperparathyroidism of renal origin: Secondary | ICD-10-CM | POA: Diagnosis not present

## 2017-12-23 DIAGNOSIS — D631 Anemia in chronic kidney disease: Secondary | ICD-10-CM | POA: Diagnosis not present

## 2017-12-23 DIAGNOSIS — N186 End stage renal disease: Secondary | ICD-10-CM | POA: Diagnosis not present

## 2017-12-23 DIAGNOSIS — D509 Iron deficiency anemia, unspecified: Secondary | ICD-10-CM | POA: Diagnosis not present

## 2017-12-23 DIAGNOSIS — Z79899 Other long term (current) drug therapy: Secondary | ICD-10-CM | POA: Diagnosis not present

## 2017-12-23 DIAGNOSIS — N2581 Secondary hyperparathyroidism of renal origin: Secondary | ICD-10-CM | POA: Diagnosis not present

## 2017-12-25 DIAGNOSIS — E1121 Type 2 diabetes mellitus with diabetic nephropathy: Secondary | ICD-10-CM | POA: Diagnosis not present

## 2017-12-25 DIAGNOSIS — N186 End stage renal disease: Secondary | ICD-10-CM | POA: Diagnosis not present

## 2017-12-25 DIAGNOSIS — J449 Chronic obstructive pulmonary disease, unspecified: Secondary | ICD-10-CM | POA: Diagnosis not present

## 2017-12-25 DIAGNOSIS — E039 Hypothyroidism, unspecified: Secondary | ICD-10-CM | POA: Diagnosis not present

## 2017-12-25 DIAGNOSIS — I82409 Acute embolism and thrombosis of unspecified deep veins of unspecified lower extremity: Secondary | ICD-10-CM | POA: Diagnosis not present

## 2017-12-26 DIAGNOSIS — R0602 Shortness of breath: Secondary | ICD-10-CM | POA: Diagnosis not present

## 2017-12-26 DIAGNOSIS — R5383 Other fatigue: Secondary | ICD-10-CM | POA: Diagnosis not present

## 2017-12-26 DIAGNOSIS — D509 Iron deficiency anemia, unspecified: Secondary | ICD-10-CM | POA: Diagnosis not present

## 2017-12-26 DIAGNOSIS — N186 End stage renal disease: Secondary | ICD-10-CM | POA: Diagnosis not present

## 2017-12-26 DIAGNOSIS — Z79899 Other long term (current) drug therapy: Secondary | ICD-10-CM | POA: Diagnosis not present

## 2017-12-26 DIAGNOSIS — N2581 Secondary hyperparathyroidism of renal origin: Secondary | ICD-10-CM | POA: Diagnosis not present

## 2017-12-26 DIAGNOSIS — D631 Anemia in chronic kidney disease: Secondary | ICD-10-CM | POA: Diagnosis not present

## 2017-12-27 DIAGNOSIS — R0602 Shortness of breath: Secondary | ICD-10-CM | POA: Diagnosis not present

## 2017-12-27 DIAGNOSIS — N2581 Secondary hyperparathyroidism of renal origin: Secondary | ICD-10-CM | POA: Diagnosis not present

## 2017-12-27 DIAGNOSIS — N186 End stage renal disease: Secondary | ICD-10-CM | POA: Diagnosis not present

## 2017-12-27 DIAGNOSIS — D631 Anemia in chronic kidney disease: Secondary | ICD-10-CM | POA: Diagnosis not present

## 2017-12-27 DIAGNOSIS — Z79899 Other long term (current) drug therapy: Secondary | ICD-10-CM | POA: Diagnosis not present

## 2017-12-27 DIAGNOSIS — R5383 Other fatigue: Secondary | ICD-10-CM | POA: Diagnosis not present

## 2017-12-27 DIAGNOSIS — D509 Iron deficiency anemia, unspecified: Secondary | ICD-10-CM | POA: Diagnosis not present

## 2017-12-29 DIAGNOSIS — D631 Anemia in chronic kidney disease: Secondary | ICD-10-CM | POA: Diagnosis not present

## 2017-12-29 DIAGNOSIS — D509 Iron deficiency anemia, unspecified: Secondary | ICD-10-CM | POA: Diagnosis not present

## 2017-12-29 DIAGNOSIS — N186 End stage renal disease: Secondary | ICD-10-CM | POA: Diagnosis not present

## 2017-12-29 DIAGNOSIS — N2581 Secondary hyperparathyroidism of renal origin: Secondary | ICD-10-CM | POA: Diagnosis not present

## 2017-12-29 DIAGNOSIS — Z79899 Other long term (current) drug therapy: Secondary | ICD-10-CM | POA: Diagnosis not present

## 2017-12-30 DIAGNOSIS — D631 Anemia in chronic kidney disease: Secondary | ICD-10-CM | POA: Diagnosis not present

## 2017-12-30 DIAGNOSIS — N2581 Secondary hyperparathyroidism of renal origin: Secondary | ICD-10-CM | POA: Diagnosis not present

## 2017-12-30 DIAGNOSIS — D509 Iron deficiency anemia, unspecified: Secondary | ICD-10-CM | POA: Diagnosis not present

## 2017-12-30 DIAGNOSIS — N186 End stage renal disease: Secondary | ICD-10-CM | POA: Diagnosis not present

## 2017-12-30 DIAGNOSIS — Z79899 Other long term (current) drug therapy: Secondary | ICD-10-CM | POA: Diagnosis not present

## 2018-01-01 DIAGNOSIS — N2581 Secondary hyperparathyroidism of renal origin: Secondary | ICD-10-CM | POA: Diagnosis not present

## 2018-01-01 DIAGNOSIS — D509 Iron deficiency anemia, unspecified: Secondary | ICD-10-CM | POA: Diagnosis not present

## 2018-01-01 DIAGNOSIS — Z79899 Other long term (current) drug therapy: Secondary | ICD-10-CM | POA: Diagnosis not present

## 2018-01-01 DIAGNOSIS — D631 Anemia in chronic kidney disease: Secondary | ICD-10-CM | POA: Diagnosis not present

## 2018-01-01 DIAGNOSIS — N186 End stage renal disease: Secondary | ICD-10-CM | POA: Diagnosis not present

## 2018-01-02 DIAGNOSIS — Z79899 Other long term (current) drug therapy: Secondary | ICD-10-CM | POA: Diagnosis not present

## 2018-01-02 DIAGNOSIS — N2581 Secondary hyperparathyroidism of renal origin: Secondary | ICD-10-CM | POA: Diagnosis not present

## 2018-01-02 DIAGNOSIS — D631 Anemia in chronic kidney disease: Secondary | ICD-10-CM | POA: Diagnosis not present

## 2018-01-02 DIAGNOSIS — N186 End stage renal disease: Secondary | ICD-10-CM | POA: Diagnosis not present

## 2018-01-02 DIAGNOSIS — D509 Iron deficiency anemia, unspecified: Secondary | ICD-10-CM | POA: Diagnosis not present

## 2018-01-03 DIAGNOSIS — R0602 Shortness of breath: Secondary | ICD-10-CM | POA: Diagnosis not present

## 2018-01-04 DIAGNOSIS — D631 Anemia in chronic kidney disease: Secondary | ICD-10-CM | POA: Diagnosis not present

## 2018-01-04 DIAGNOSIS — N186 End stage renal disease: Secondary | ICD-10-CM | POA: Diagnosis not present

## 2018-01-04 DIAGNOSIS — N2581 Secondary hyperparathyroidism of renal origin: Secondary | ICD-10-CM | POA: Diagnosis not present

## 2018-01-04 DIAGNOSIS — D509 Iron deficiency anemia, unspecified: Secondary | ICD-10-CM | POA: Diagnosis not present

## 2018-01-04 DIAGNOSIS — Z79899 Other long term (current) drug therapy: Secondary | ICD-10-CM | POA: Diagnosis not present

## 2018-01-05 DIAGNOSIS — N186 End stage renal disease: Secondary | ICD-10-CM | POA: Diagnosis not present

## 2018-01-05 DIAGNOSIS — D631 Anemia in chronic kidney disease: Secondary | ICD-10-CM | POA: Diagnosis not present

## 2018-01-05 DIAGNOSIS — D509 Iron deficiency anemia, unspecified: Secondary | ICD-10-CM | POA: Diagnosis not present

## 2018-01-05 DIAGNOSIS — N2581 Secondary hyperparathyroidism of renal origin: Secondary | ICD-10-CM | POA: Diagnosis not present

## 2018-01-05 DIAGNOSIS — Z79899 Other long term (current) drug therapy: Secondary | ICD-10-CM | POA: Diagnosis not present

## 2018-01-07 DIAGNOSIS — D509 Iron deficiency anemia, unspecified: Secondary | ICD-10-CM | POA: Diagnosis not present

## 2018-01-07 DIAGNOSIS — I350 Nonrheumatic aortic (valve) stenosis: Secondary | ICD-10-CM | POA: Insufficient documentation

## 2018-01-07 DIAGNOSIS — N2581 Secondary hyperparathyroidism of renal origin: Secondary | ICD-10-CM | POA: Diagnosis not present

## 2018-01-07 DIAGNOSIS — Z79899 Other long term (current) drug therapy: Secondary | ICD-10-CM | POA: Diagnosis not present

## 2018-01-07 DIAGNOSIS — Z992 Dependence on renal dialysis: Secondary | ICD-10-CM | POA: Diagnosis not present

## 2018-01-07 DIAGNOSIS — R7881 Bacteremia: Secondary | ICD-10-CM | POA: Diagnosis not present

## 2018-01-07 DIAGNOSIS — R931 Abnormal findings on diagnostic imaging of heart and coronary circulation: Secondary | ICD-10-CM | POA: Insufficient documentation

## 2018-01-07 DIAGNOSIS — R509 Fever, unspecified: Secondary | ICD-10-CM | POA: Diagnosis not present

## 2018-01-07 DIAGNOSIS — N186 End stage renal disease: Secondary | ICD-10-CM | POA: Diagnosis not present

## 2018-01-07 DIAGNOSIS — N059 Unspecified nephritic syndrome with unspecified morphologic changes: Secondary | ICD-10-CM | POA: Diagnosis not present

## 2018-01-07 DIAGNOSIS — D631 Anemia in chronic kidney disease: Secondary | ICD-10-CM | POA: Diagnosis not present

## 2018-01-08 DIAGNOSIS — Z79899 Other long term (current) drug therapy: Secondary | ICD-10-CM | POA: Diagnosis not present

## 2018-01-08 DIAGNOSIS — D509 Iron deficiency anemia, unspecified: Secondary | ICD-10-CM | POA: Diagnosis not present

## 2018-01-08 DIAGNOSIS — R7881 Bacteremia: Secondary | ICD-10-CM | POA: Diagnosis not present

## 2018-01-08 DIAGNOSIS — N2581 Secondary hyperparathyroidism of renal origin: Secondary | ICD-10-CM | POA: Diagnosis not present

## 2018-01-08 DIAGNOSIS — R509 Fever, unspecified: Secondary | ICD-10-CM | POA: Diagnosis not present

## 2018-01-08 DIAGNOSIS — N186 End stage renal disease: Secondary | ICD-10-CM | POA: Diagnosis not present

## 2018-01-09 DIAGNOSIS — I82409 Acute embolism and thrombosis of unspecified deep veins of unspecified lower extremity: Secondary | ICD-10-CM | POA: Diagnosis not present

## 2018-01-09 DIAGNOSIS — R197 Diarrhea, unspecified: Secondary | ICD-10-CM | POA: Diagnosis not present

## 2018-01-09 DIAGNOSIS — J449 Chronic obstructive pulmonary disease, unspecified: Secondary | ICD-10-CM | POA: Diagnosis not present

## 2018-01-09 DIAGNOSIS — N186 End stage renal disease: Secondary | ICD-10-CM | POA: Diagnosis not present

## 2018-01-10 DIAGNOSIS — N186 End stage renal disease: Secondary | ICD-10-CM | POA: Diagnosis not present

## 2018-01-10 DIAGNOSIS — Z79899 Other long term (current) drug therapy: Secondary | ICD-10-CM | POA: Diagnosis not present

## 2018-01-10 DIAGNOSIS — D509 Iron deficiency anemia, unspecified: Secondary | ICD-10-CM | POA: Diagnosis not present

## 2018-01-10 DIAGNOSIS — N2581 Secondary hyperparathyroidism of renal origin: Secondary | ICD-10-CM | POA: Diagnosis not present

## 2018-01-10 DIAGNOSIS — R7881 Bacteremia: Secondary | ICD-10-CM | POA: Diagnosis not present

## 2018-01-10 DIAGNOSIS — R509 Fever, unspecified: Secondary | ICD-10-CM | POA: Diagnosis not present

## 2018-01-11 DIAGNOSIS — R0602 Shortness of breath: Secondary | ICD-10-CM | POA: Diagnosis not present

## 2018-01-11 DIAGNOSIS — I350 Nonrheumatic aortic (valve) stenosis: Secondary | ICD-10-CM | POA: Diagnosis not present

## 2018-01-11 DIAGNOSIS — I2510 Atherosclerotic heart disease of native coronary artery without angina pectoris: Secondary | ICD-10-CM | POA: Diagnosis not present

## 2018-01-11 DIAGNOSIS — R05 Cough: Secondary | ICD-10-CM | POA: Diagnosis not present

## 2018-01-11 DIAGNOSIS — I272 Pulmonary hypertension, unspecified: Secondary | ICD-10-CM | POA: Diagnosis not present

## 2018-01-11 DIAGNOSIS — R9439 Abnormal result of other cardiovascular function study: Secondary | ICD-10-CM | POA: Diagnosis not present

## 2018-01-11 DIAGNOSIS — I5020 Unspecified systolic (congestive) heart failure: Secondary | ICD-10-CM | POA: Diagnosis not present

## 2018-01-12 DIAGNOSIS — N2581 Secondary hyperparathyroidism of renal origin: Secondary | ICD-10-CM | POA: Diagnosis not present

## 2018-01-12 DIAGNOSIS — I5022 Chronic systolic (congestive) heart failure: Secondary | ICD-10-CM | POA: Insufficient documentation

## 2018-01-12 DIAGNOSIS — R9430 Abnormal result of cardiovascular function study, unspecified: Secondary | ICD-10-CM | POA: Diagnosis not present

## 2018-01-12 DIAGNOSIS — Z955 Presence of coronary angioplasty implant and graft: Secondary | ICD-10-CM | POA: Diagnosis not present

## 2018-01-12 DIAGNOSIS — R509 Fever, unspecified: Secondary | ICD-10-CM | POA: Diagnosis not present

## 2018-01-12 DIAGNOSIS — I2510 Atherosclerotic heart disease of native coronary artery without angina pectoris: Secondary | ICD-10-CM | POA: Diagnosis not present

## 2018-01-12 DIAGNOSIS — R9439 Abnormal result of other cardiovascular function study: Secondary | ICD-10-CM | POA: Diagnosis not present

## 2018-01-12 DIAGNOSIS — D509 Iron deficiency anemia, unspecified: Secondary | ICD-10-CM | POA: Diagnosis not present

## 2018-01-12 DIAGNOSIS — I350 Nonrheumatic aortic (valve) stenosis: Secondary | ICD-10-CM | POA: Diagnosis not present

## 2018-01-12 DIAGNOSIS — R7881 Bacteremia: Secondary | ICD-10-CM | POA: Diagnosis not present

## 2018-01-12 DIAGNOSIS — Z79899 Other long term (current) drug therapy: Secondary | ICD-10-CM | POA: Diagnosis not present

## 2018-01-12 DIAGNOSIS — N186 End stage renal disease: Secondary | ICD-10-CM | POA: Diagnosis not present

## 2018-01-13 DIAGNOSIS — N186 End stage renal disease: Secondary | ICD-10-CM | POA: Diagnosis not present

## 2018-01-13 DIAGNOSIS — D509 Iron deficiency anemia, unspecified: Secondary | ICD-10-CM | POA: Diagnosis not present

## 2018-01-13 DIAGNOSIS — R509 Fever, unspecified: Secondary | ICD-10-CM | POA: Diagnosis not present

## 2018-01-13 DIAGNOSIS — R7881 Bacteremia: Secondary | ICD-10-CM | POA: Diagnosis not present

## 2018-01-13 DIAGNOSIS — N2581 Secondary hyperparathyroidism of renal origin: Secondary | ICD-10-CM | POA: Diagnosis not present

## 2018-01-13 DIAGNOSIS — Z79899 Other long term (current) drug therapy: Secondary | ICD-10-CM | POA: Diagnosis not present

## 2018-01-15 DIAGNOSIS — R509 Fever, unspecified: Secondary | ICD-10-CM | POA: Diagnosis not present

## 2018-01-15 DIAGNOSIS — N186 End stage renal disease: Secondary | ICD-10-CM | POA: Diagnosis not present

## 2018-01-15 DIAGNOSIS — N2581 Secondary hyperparathyroidism of renal origin: Secondary | ICD-10-CM | POA: Diagnosis not present

## 2018-01-15 DIAGNOSIS — Z79899 Other long term (current) drug therapy: Secondary | ICD-10-CM | POA: Diagnosis not present

## 2018-01-15 DIAGNOSIS — I517 Cardiomegaly: Secondary | ICD-10-CM | POA: Diagnosis not present

## 2018-01-15 DIAGNOSIS — R7881 Bacteremia: Secondary | ICD-10-CM | POA: Diagnosis not present

## 2018-01-15 DIAGNOSIS — D509 Iron deficiency anemia, unspecified: Secondary | ICD-10-CM | POA: Diagnosis not present

## 2018-01-15 DIAGNOSIS — R9431 Abnormal electrocardiogram [ECG] [EKG]: Secondary | ICD-10-CM | POA: Diagnosis not present

## 2018-01-16 DIAGNOSIS — D509 Iron deficiency anemia, unspecified: Secondary | ICD-10-CM | POA: Diagnosis not present

## 2018-01-16 DIAGNOSIS — N2581 Secondary hyperparathyroidism of renal origin: Secondary | ICD-10-CM | POA: Diagnosis not present

## 2018-01-16 DIAGNOSIS — R509 Fever, unspecified: Secondary | ICD-10-CM | POA: Diagnosis not present

## 2018-01-16 DIAGNOSIS — R7881 Bacteremia: Secondary | ICD-10-CM | POA: Diagnosis not present

## 2018-01-16 DIAGNOSIS — Z79899 Other long term (current) drug therapy: Secondary | ICD-10-CM | POA: Diagnosis not present

## 2018-01-16 DIAGNOSIS — N186 End stage renal disease: Secondary | ICD-10-CM | POA: Diagnosis not present

## 2018-01-17 DIAGNOSIS — R7881 Bacteremia: Secondary | ICD-10-CM | POA: Diagnosis not present

## 2018-01-17 DIAGNOSIS — R509 Fever, unspecified: Secondary | ICD-10-CM | POA: Diagnosis not present

## 2018-01-17 DIAGNOSIS — I5022 Chronic systolic (congestive) heart failure: Secondary | ICD-10-CM | POA: Diagnosis not present

## 2018-01-17 DIAGNOSIS — D509 Iron deficiency anemia, unspecified: Secondary | ICD-10-CM | POA: Diagnosis not present

## 2018-01-17 DIAGNOSIS — Z79899 Other long term (current) drug therapy: Secondary | ICD-10-CM | POA: Diagnosis not present

## 2018-01-17 DIAGNOSIS — N2581 Secondary hyperparathyroidism of renal origin: Secondary | ICD-10-CM | POA: Diagnosis not present

## 2018-01-17 DIAGNOSIS — R0602 Shortness of breath: Secondary | ICD-10-CM | POA: Diagnosis not present

## 2018-01-17 DIAGNOSIS — N186 End stage renal disease: Secondary | ICD-10-CM | POA: Diagnosis not present

## 2018-01-20 DIAGNOSIS — D509 Iron deficiency anemia, unspecified: Secondary | ICD-10-CM | POA: Diagnosis not present

## 2018-01-20 DIAGNOSIS — N186 End stage renal disease: Secondary | ICD-10-CM | POA: Diagnosis not present

## 2018-01-20 DIAGNOSIS — R509 Fever, unspecified: Secondary | ICD-10-CM | POA: Diagnosis not present

## 2018-01-20 DIAGNOSIS — N2581 Secondary hyperparathyroidism of renal origin: Secondary | ICD-10-CM | POA: Diagnosis not present

## 2018-01-20 DIAGNOSIS — R7881 Bacteremia: Secondary | ICD-10-CM | POA: Diagnosis not present

## 2018-01-20 DIAGNOSIS — Z79899 Other long term (current) drug therapy: Secondary | ICD-10-CM | POA: Diagnosis not present

## 2018-01-21 DIAGNOSIS — R7881 Bacteremia: Secondary | ICD-10-CM | POA: Diagnosis not present

## 2018-01-21 DIAGNOSIS — R509 Fever, unspecified: Secondary | ICD-10-CM | POA: Diagnosis not present

## 2018-01-21 DIAGNOSIS — D509 Iron deficiency anemia, unspecified: Secondary | ICD-10-CM | POA: Diagnosis not present

## 2018-01-21 DIAGNOSIS — N2581 Secondary hyperparathyroidism of renal origin: Secondary | ICD-10-CM | POA: Diagnosis not present

## 2018-01-21 DIAGNOSIS — N186 End stage renal disease: Secondary | ICD-10-CM | POA: Diagnosis not present

## 2018-01-21 DIAGNOSIS — Z79899 Other long term (current) drug therapy: Secondary | ICD-10-CM | POA: Diagnosis not present

## 2018-01-23 DIAGNOSIS — N186 End stage renal disease: Secondary | ICD-10-CM | POA: Diagnosis not present

## 2018-01-23 DIAGNOSIS — D509 Iron deficiency anemia, unspecified: Secondary | ICD-10-CM | POA: Diagnosis not present

## 2018-01-23 DIAGNOSIS — N2581 Secondary hyperparathyroidism of renal origin: Secondary | ICD-10-CM | POA: Diagnosis not present

## 2018-01-23 DIAGNOSIS — Z79899 Other long term (current) drug therapy: Secondary | ICD-10-CM | POA: Diagnosis not present

## 2018-01-23 DIAGNOSIS — R7881 Bacteremia: Secondary | ICD-10-CM | POA: Diagnosis not present

## 2018-01-23 DIAGNOSIS — R509 Fever, unspecified: Secondary | ICD-10-CM | POA: Diagnosis not present

## 2018-01-24 DIAGNOSIS — R509 Fever, unspecified: Secondary | ICD-10-CM | POA: Diagnosis not present

## 2018-01-24 DIAGNOSIS — D509 Iron deficiency anemia, unspecified: Secondary | ICD-10-CM | POA: Diagnosis not present

## 2018-01-24 DIAGNOSIS — R7881 Bacteremia: Secondary | ICD-10-CM | POA: Diagnosis not present

## 2018-01-24 DIAGNOSIS — Z79899 Other long term (current) drug therapy: Secondary | ICD-10-CM | POA: Diagnosis not present

## 2018-01-24 DIAGNOSIS — N186 End stage renal disease: Secondary | ICD-10-CM | POA: Diagnosis not present

## 2018-01-24 DIAGNOSIS — N2581 Secondary hyperparathyroidism of renal origin: Secondary | ICD-10-CM | POA: Diagnosis not present

## 2018-01-25 DIAGNOSIS — Z79899 Other long term (current) drug therapy: Secondary | ICD-10-CM | POA: Diagnosis not present

## 2018-01-25 DIAGNOSIS — R509 Fever, unspecified: Secondary | ICD-10-CM | POA: Diagnosis not present

## 2018-01-25 DIAGNOSIS — N2581 Secondary hyperparathyroidism of renal origin: Secondary | ICD-10-CM | POA: Diagnosis not present

## 2018-01-25 DIAGNOSIS — D509 Iron deficiency anemia, unspecified: Secondary | ICD-10-CM | POA: Diagnosis not present

## 2018-01-25 DIAGNOSIS — N186 End stage renal disease: Secondary | ICD-10-CM | POA: Diagnosis not present

## 2018-01-25 DIAGNOSIS — R7881 Bacteremia: Secondary | ICD-10-CM | POA: Diagnosis not present

## 2018-01-26 DIAGNOSIS — R7881 Bacteremia: Secondary | ICD-10-CM | POA: Diagnosis not present

## 2018-01-26 DIAGNOSIS — N186 End stage renal disease: Secondary | ICD-10-CM | POA: Diagnosis not present

## 2018-01-26 DIAGNOSIS — Z79899 Other long term (current) drug therapy: Secondary | ICD-10-CM | POA: Diagnosis not present

## 2018-01-26 DIAGNOSIS — D509 Iron deficiency anemia, unspecified: Secondary | ICD-10-CM | POA: Diagnosis not present

## 2018-01-26 DIAGNOSIS — N2581 Secondary hyperparathyroidism of renal origin: Secondary | ICD-10-CM | POA: Diagnosis not present

## 2018-01-26 DIAGNOSIS — R509 Fever, unspecified: Secondary | ICD-10-CM | POA: Diagnosis not present

## 2018-01-29 DIAGNOSIS — Z79899 Other long term (current) drug therapy: Secondary | ICD-10-CM | POA: Diagnosis not present

## 2018-01-29 DIAGNOSIS — D509 Iron deficiency anemia, unspecified: Secondary | ICD-10-CM | POA: Diagnosis not present

## 2018-01-29 DIAGNOSIS — R7881 Bacteremia: Secondary | ICD-10-CM | POA: Diagnosis not present

## 2018-01-29 DIAGNOSIS — N2581 Secondary hyperparathyroidism of renal origin: Secondary | ICD-10-CM | POA: Diagnosis not present

## 2018-01-29 DIAGNOSIS — N186 End stage renal disease: Secondary | ICD-10-CM | POA: Diagnosis not present

## 2018-01-29 DIAGNOSIS — R509 Fever, unspecified: Secondary | ICD-10-CM | POA: Diagnosis not present

## 2018-01-30 ENCOUNTER — Encounter: Payer: Self-pay | Admitting: Sports Medicine

## 2018-01-30 ENCOUNTER — Ambulatory Visit (INDEPENDENT_AMBULATORY_CARE_PROVIDER_SITE_OTHER): Payer: Medicare Other | Admitting: Sports Medicine

## 2018-01-30 VITALS — BP 161/74 | HR 75 | Temp 98.7°F | Resp 16

## 2018-01-30 DIAGNOSIS — M79674 Pain in right toe(s): Secondary | ICD-10-CM | POA: Diagnosis not present

## 2018-01-30 DIAGNOSIS — N186 End stage renal disease: Secondary | ICD-10-CM

## 2018-01-30 DIAGNOSIS — M79675 Pain in left toe(s): Secondary | ICD-10-CM | POA: Diagnosis not present

## 2018-01-30 DIAGNOSIS — Z992 Dependence on renal dialysis: Secondary | ICD-10-CM

## 2018-01-30 DIAGNOSIS — B351 Tinea unguium: Secondary | ICD-10-CM

## 2018-01-30 DIAGNOSIS — E1140 Type 2 diabetes mellitus with diabetic neuropathy, unspecified: Secondary | ICD-10-CM

## 2018-01-30 NOTE — Progress Notes (Signed)
Subjective: Linda Jordan is a 84 y.o. female patient with history of diabetes who presents to office today complaining of long, painful nails  while ambulating in shoes; unable to trim. Patient states that the glucose readings are not checked and she saw Dr. Truman Hayward  4 weeks ago who reported that her A1c was 5.5.  Patient is still doing home dialysis and had a stenting procedure 1 month ago, denies any acute changes or problems since last visit.  Patient Active Problem List   Diagnosis Date Noted  . Thyroid disease 05/28/2013  . Hypertension 05/28/2013  . Arthritis 02/16/2011  . Bilateral swelling of feet 02/16/2011  . Mycotic toenails 02/16/2011   Current Outpatient Medications on File Prior to Visit  Medication Sig Dispense Refill  . Acetaminophen (TYLENOL EX ST ARTHRITIS PAIN PO) Take by mouth.      Marland Kitchen amLODipine (NORVASC) 10 MG tablet Take 10 mg by mouth daily.      Marland Kitchen aspirin EC 81 MG tablet Take 81 mg by mouth.    Marland Kitchen atorvastatin (LIPITOR) 80 MG tablet Take 80 mg by mouth.    Marland Kitchen b complex vitamins capsule Take by mouth.    Marland Kitchen buPROPion (WELLBUTRIN SR) 100 MG 12 hr tablet TAKE 1 TABLET (100 MG TOTAL) BY MOUTH DAILY.  4  . carvedilol (COREG) 3.125 MG tablet Take 3.125 mg by mouth.    . cetirizine (ZYRTEC) 10 MG tablet 1 (ONE) TABLET BY MOUTH AT BEDTIME  12  . hydrochlorothiazide (HYDRODIURIL) 25 MG tablet Take 25 mg by mouth daily.    Marland Kitchen letrozole (FEMARA) 2.5 MG tablet 1 TABLET ONCE DAILY - TAKE DAILY FOR HORMONAL PROTECTION AGAINST FUTURE BREAST CANCER  3  . levothyroxine (SYNTHROID) 25 MCG tablet Take by mouth.    . losartan (COZAAR) 50 MG tablet TAKE 1 TABLET BY MOUTH AT BEDTIME (STOP LISINOPRIL)  11  . metoprolol (LOPRESSOR) 50 MG tablet Take 50 mg by mouth 2 (two) times daily.    . Multiple Vitamin (MULTIVITAMIN) tablet Take 1 tablet by mouth daily.    . Nebivolol HCl (BYSTOLIC) 20 MG TABS Take by mouth.      . Omega-3 Fatty Acids (FISH OIL CONCENTRATE) 300 MG CAPS Take by mouth.    Marland Kitchen  omeprazole (PRILOSEC) 40 MG capsule Take 40 mg by mouth daily.      Marland Kitchen oxycodone (OXY-IR) 5 MG capsule TAKE ONE TO TWO CAPSULES BY MOUTH EVERY 4 HOURS AS NEEDED FOR PAIN  0  . pregabalin (LYRICA) 75 MG capsule Take 75 mg by mouth 2 (two) times daily.      . rosuvastatin (CRESTOR) 10 MG tablet Take 10 mg by mouth daily.      Marland Kitchen spironolactone (ALDACTONE) 100 MG tablet Take 100 mg by mouth.    . warfarin (COUMADIN) 2 MG tablet Take 2 mg by mouth daily.       No current facility-administered medications on file prior to visit.    Allergies  Allergen Reactions  . Codeine Nausea And Vomiting  . Other   . Sulfa Antibiotics Nausea And Vomiting  . Tape     No results found for this or any previous visit (from the past 2160 hour(s)).  Objective: General: Patient is awake, alert, and oriented x 3 and in no acute distress.  Integument: Skin is warm, dry and supple bilateral. Nails are tender, long, thickened and dystrophic with subungual debris, consistent with onychomycosis, 1-5 bilateral. No signs of infection. No open lesions or preulcerative lesions  present bilateral. Remaining integument unremarkable.  Vasculature:  Dorsalis Pedis pulse 1/4 bilateral. Posterior Tibial pulse  1/4 bilateral.  Capillary fill time <3 sec 1-5 bilateral. Scant hair growth to the level of the digits. Temperature gradient within normal limits. Trace varicosities present bilateral. No edema present bilateral.   Neurology: The patient has absent sensation measured with a 5.07/10g Semmes Weinstein Monofilament at all pedal sites bilateral . Vibratory sensation absent bilateral with tuning fork. No Babinski sign present bilateral.   Musculoskeletal: Asymptomatic hammertoes pedal deformities noted bilateral. Muscular strength 5/5 in all lower extremity muscular groups bilateral without pain on range of motion . No tenderness with calf compression bilateral.  Assessment and Plan: Problem List Items Addressed This Visit     None    Visit Diagnoses    Pain due to onychomycosis of toenails of both feet    -  Primary   Type 2 diabetes, controlled, with neuropathy ([place])       ESRD on hemodialysis ([place])         -Examined patient. -Mechanically debrided all nails 1-5 bilateral using sterile nail nipper and filed with dremel without incident -Patient to return  in 3 months for at risk foot care -Patient advised to call the office if any problems or questions arise in the meantime.  Landis Martins, DPM

## 2018-01-31 DIAGNOSIS — N2581 Secondary hyperparathyroidism of renal origin: Secondary | ICD-10-CM | POA: Diagnosis not present

## 2018-01-31 DIAGNOSIS — R5381 Other malaise: Secondary | ICD-10-CM | POA: Diagnosis not present

## 2018-01-31 DIAGNOSIS — R509 Fever, unspecified: Secondary | ICD-10-CM | POA: Diagnosis not present

## 2018-01-31 DIAGNOSIS — N186 End stage renal disease: Secondary | ICD-10-CM | POA: Diagnosis not present

## 2018-01-31 DIAGNOSIS — R7881 Bacteremia: Secondary | ICD-10-CM | POA: Diagnosis not present

## 2018-01-31 DIAGNOSIS — Z79899 Other long term (current) drug therapy: Secondary | ICD-10-CM | POA: Diagnosis not present

## 2018-01-31 DIAGNOSIS — D509 Iron deficiency anemia, unspecified: Secondary | ICD-10-CM | POA: Diagnosis not present

## 2018-02-01 DIAGNOSIS — Z79899 Other long term (current) drug therapy: Secondary | ICD-10-CM | POA: Diagnosis not present

## 2018-02-01 DIAGNOSIS — D509 Iron deficiency anemia, unspecified: Secondary | ICD-10-CM | POA: Diagnosis not present

## 2018-02-01 DIAGNOSIS — N2581 Secondary hyperparathyroidism of renal origin: Secondary | ICD-10-CM | POA: Diagnosis not present

## 2018-02-01 DIAGNOSIS — R509 Fever, unspecified: Secondary | ICD-10-CM | POA: Diagnosis not present

## 2018-02-01 DIAGNOSIS — R7881 Bacteremia: Secondary | ICD-10-CM | POA: Diagnosis not present

## 2018-02-01 DIAGNOSIS — N186 End stage renal disease: Secondary | ICD-10-CM | POA: Diagnosis not present

## 2018-02-02 DIAGNOSIS — N186 End stage renal disease: Secondary | ICD-10-CM | POA: Diagnosis not present

## 2018-02-02 DIAGNOSIS — D509 Iron deficiency anemia, unspecified: Secondary | ICD-10-CM | POA: Diagnosis not present

## 2018-02-02 DIAGNOSIS — N2581 Secondary hyperparathyroidism of renal origin: Secondary | ICD-10-CM | POA: Diagnosis not present

## 2018-02-02 DIAGNOSIS — R7881 Bacteremia: Secondary | ICD-10-CM | POA: Diagnosis not present

## 2018-02-02 DIAGNOSIS — Z79899 Other long term (current) drug therapy: Secondary | ICD-10-CM | POA: Diagnosis not present

## 2018-02-02 DIAGNOSIS — R509 Fever, unspecified: Secondary | ICD-10-CM | POA: Diagnosis not present

## 2018-02-04 DIAGNOSIS — Z79899 Other long term (current) drug therapy: Secondary | ICD-10-CM | POA: Diagnosis not present

## 2018-02-04 DIAGNOSIS — N2581 Secondary hyperparathyroidism of renal origin: Secondary | ICD-10-CM | POA: Diagnosis not present

## 2018-02-04 DIAGNOSIS — R7881 Bacteremia: Secondary | ICD-10-CM | POA: Diagnosis not present

## 2018-02-04 DIAGNOSIS — D509 Iron deficiency anemia, unspecified: Secondary | ICD-10-CM | POA: Diagnosis not present

## 2018-02-04 DIAGNOSIS — R509 Fever, unspecified: Secondary | ICD-10-CM | POA: Diagnosis not present

## 2018-02-04 DIAGNOSIS — N186 End stage renal disease: Secondary | ICD-10-CM | POA: Diagnosis not present

## 2018-02-06 DIAGNOSIS — N186 End stage renal disease: Secondary | ICD-10-CM | POA: Diagnosis not present

## 2018-02-06 DIAGNOSIS — A412 Sepsis due to unspecified staphylococcus: Secondary | ICD-10-CM | POA: Diagnosis not present

## 2018-02-06 DIAGNOSIS — N2581 Secondary hyperparathyroidism of renal origin: Secondary | ICD-10-CM | POA: Diagnosis not present

## 2018-02-06 DIAGNOSIS — R7881 Bacteremia: Secondary | ICD-10-CM | POA: Diagnosis not present

## 2018-02-06 DIAGNOSIS — D509 Iron deficiency anemia, unspecified: Secondary | ICD-10-CM | POA: Diagnosis not present

## 2018-02-06 DIAGNOSIS — R509 Fever, unspecified: Secondary | ICD-10-CM | POA: Diagnosis not present

## 2018-02-06 DIAGNOSIS — Z79899 Other long term (current) drug therapy: Secondary | ICD-10-CM | POA: Diagnosis not present

## 2018-02-07 DIAGNOSIS — Z992 Dependence on renal dialysis: Secondary | ICD-10-CM | POA: Diagnosis not present

## 2018-02-07 DIAGNOSIS — N186 End stage renal disease: Secondary | ICD-10-CM | POA: Diagnosis not present

## 2018-02-07 DIAGNOSIS — N059 Unspecified nephritic syndrome with unspecified morphologic changes: Secondary | ICD-10-CM | POA: Diagnosis not present

## 2018-02-08 DIAGNOSIS — D509 Iron deficiency anemia, unspecified: Secondary | ICD-10-CM | POA: Diagnosis not present

## 2018-02-08 DIAGNOSIS — Z79899 Other long term (current) drug therapy: Secondary | ICD-10-CM | POA: Diagnosis not present

## 2018-02-08 DIAGNOSIS — Z4931 Encounter for adequacy testing for hemodialysis: Secondary | ICD-10-CM | POA: Diagnosis not present

## 2018-02-08 DIAGNOSIS — I5022 Chronic systolic (congestive) heart failure: Secondary | ICD-10-CM | POA: Diagnosis not present

## 2018-02-08 DIAGNOSIS — D631 Anemia in chronic kidney disease: Secondary | ICD-10-CM | POA: Diagnosis not present

## 2018-02-08 DIAGNOSIS — N2581 Secondary hyperparathyroidism of renal origin: Secondary | ICD-10-CM | POA: Diagnosis not present

## 2018-02-08 DIAGNOSIS — N186 End stage renal disease: Secondary | ICD-10-CM | POA: Diagnosis not present

## 2018-02-08 DIAGNOSIS — R7881 Bacteremia: Secondary | ICD-10-CM | POA: Diagnosis not present

## 2018-02-10 DIAGNOSIS — Z4931 Encounter for adequacy testing for hemodialysis: Secondary | ICD-10-CM | POA: Diagnosis not present

## 2018-02-10 DIAGNOSIS — R7881 Bacteremia: Secondary | ICD-10-CM | POA: Diagnosis not present

## 2018-02-10 DIAGNOSIS — D631 Anemia in chronic kidney disease: Secondary | ICD-10-CM | POA: Diagnosis not present

## 2018-02-10 DIAGNOSIS — Z79899 Other long term (current) drug therapy: Secondary | ICD-10-CM | POA: Diagnosis not present

## 2018-02-10 DIAGNOSIS — N186 End stage renal disease: Secondary | ICD-10-CM | POA: Diagnosis not present

## 2018-02-10 DIAGNOSIS — R5381 Other malaise: Secondary | ICD-10-CM | POA: Diagnosis not present

## 2018-02-10 DIAGNOSIS — D509 Iron deficiency anemia, unspecified: Secondary | ICD-10-CM | POA: Diagnosis not present

## 2018-02-10 DIAGNOSIS — E7849 Other hyperlipidemia: Secondary | ICD-10-CM | POA: Diagnosis not present

## 2018-02-10 DIAGNOSIS — A412 Sepsis due to unspecified staphylococcus: Secondary | ICD-10-CM | POA: Diagnosis not present

## 2018-02-11 DIAGNOSIS — Z79899 Other long term (current) drug therapy: Secondary | ICD-10-CM | POA: Diagnosis not present

## 2018-02-11 DIAGNOSIS — D631 Anemia in chronic kidney disease: Secondary | ICD-10-CM | POA: Diagnosis not present

## 2018-02-11 DIAGNOSIS — R7881 Bacteremia: Secondary | ICD-10-CM | POA: Diagnosis not present

## 2018-02-11 DIAGNOSIS — N186 End stage renal disease: Secondary | ICD-10-CM | POA: Diagnosis not present

## 2018-02-11 DIAGNOSIS — D509 Iron deficiency anemia, unspecified: Secondary | ICD-10-CM | POA: Diagnosis not present

## 2018-02-11 DIAGNOSIS — Z4931 Encounter for adequacy testing for hemodialysis: Secondary | ICD-10-CM | POA: Diagnosis not present

## 2018-02-13 DIAGNOSIS — D509 Iron deficiency anemia, unspecified: Secondary | ICD-10-CM | POA: Diagnosis not present

## 2018-02-13 DIAGNOSIS — Z4931 Encounter for adequacy testing for hemodialysis: Secondary | ICD-10-CM | POA: Diagnosis not present

## 2018-02-13 DIAGNOSIS — R7881 Bacteremia: Secondary | ICD-10-CM | POA: Diagnosis not present

## 2018-02-13 DIAGNOSIS — D631 Anemia in chronic kidney disease: Secondary | ICD-10-CM | POA: Diagnosis not present

## 2018-02-13 DIAGNOSIS — Z79899 Other long term (current) drug therapy: Secondary | ICD-10-CM | POA: Diagnosis not present

## 2018-02-13 DIAGNOSIS — N186 End stage renal disease: Secondary | ICD-10-CM | POA: Diagnosis not present

## 2018-02-14 DIAGNOSIS — Z4931 Encounter for adequacy testing for hemodialysis: Secondary | ICD-10-CM | POA: Diagnosis not present

## 2018-02-14 DIAGNOSIS — D631 Anemia in chronic kidney disease: Secondary | ICD-10-CM | POA: Diagnosis not present

## 2018-02-14 DIAGNOSIS — D509 Iron deficiency anemia, unspecified: Secondary | ICD-10-CM | POA: Diagnosis not present

## 2018-02-14 DIAGNOSIS — Z79899 Other long term (current) drug therapy: Secondary | ICD-10-CM | POA: Diagnosis not present

## 2018-02-14 DIAGNOSIS — N186 End stage renal disease: Secondary | ICD-10-CM | POA: Diagnosis not present

## 2018-02-14 DIAGNOSIS — A412 Sepsis due to unspecified staphylococcus: Secondary | ICD-10-CM | POA: Diagnosis not present

## 2018-02-14 DIAGNOSIS — R7881 Bacteremia: Secondary | ICD-10-CM | POA: Diagnosis not present

## 2018-02-16 DIAGNOSIS — N186 End stage renal disease: Secondary | ICD-10-CM | POA: Diagnosis not present

## 2018-02-16 DIAGNOSIS — D509 Iron deficiency anemia, unspecified: Secondary | ICD-10-CM | POA: Diagnosis not present

## 2018-02-16 DIAGNOSIS — R7881 Bacteremia: Secondary | ICD-10-CM | POA: Diagnosis not present

## 2018-02-16 DIAGNOSIS — Z4931 Encounter for adequacy testing for hemodialysis: Secondary | ICD-10-CM | POA: Diagnosis not present

## 2018-02-16 DIAGNOSIS — D631 Anemia in chronic kidney disease: Secondary | ICD-10-CM | POA: Diagnosis not present

## 2018-02-16 DIAGNOSIS — Z79899 Other long term (current) drug therapy: Secondary | ICD-10-CM | POA: Diagnosis not present

## 2018-02-18 DIAGNOSIS — Z79899 Other long term (current) drug therapy: Secondary | ICD-10-CM | POA: Diagnosis not present

## 2018-02-18 DIAGNOSIS — R7881 Bacteremia: Secondary | ICD-10-CM | POA: Diagnosis not present

## 2018-02-18 DIAGNOSIS — Z4931 Encounter for adequacy testing for hemodialysis: Secondary | ICD-10-CM | POA: Diagnosis not present

## 2018-02-18 DIAGNOSIS — N186 End stage renal disease: Secondary | ICD-10-CM | POA: Diagnosis not present

## 2018-02-18 DIAGNOSIS — D509 Iron deficiency anemia, unspecified: Secondary | ICD-10-CM | POA: Diagnosis not present

## 2018-02-18 DIAGNOSIS — D631 Anemia in chronic kidney disease: Secondary | ICD-10-CM | POA: Diagnosis not present

## 2018-02-20 DIAGNOSIS — Z4931 Encounter for adequacy testing for hemodialysis: Secondary | ICD-10-CM | POA: Diagnosis not present

## 2018-02-20 DIAGNOSIS — D509 Iron deficiency anemia, unspecified: Secondary | ICD-10-CM | POA: Diagnosis not present

## 2018-02-20 DIAGNOSIS — R7881 Bacteremia: Secondary | ICD-10-CM | POA: Diagnosis not present

## 2018-02-20 DIAGNOSIS — N186 End stage renal disease: Secondary | ICD-10-CM | POA: Diagnosis not present

## 2018-02-20 DIAGNOSIS — D631 Anemia in chronic kidney disease: Secondary | ICD-10-CM | POA: Diagnosis not present

## 2018-02-20 DIAGNOSIS — Z79899 Other long term (current) drug therapy: Secondary | ICD-10-CM | POA: Diagnosis not present

## 2018-02-25 DIAGNOSIS — R7881 Bacteremia: Secondary | ICD-10-CM | POA: Diagnosis not present

## 2018-02-25 DIAGNOSIS — Z79899 Other long term (current) drug therapy: Secondary | ICD-10-CM | POA: Diagnosis not present

## 2018-02-25 DIAGNOSIS — D509 Iron deficiency anemia, unspecified: Secondary | ICD-10-CM | POA: Diagnosis not present

## 2018-02-25 DIAGNOSIS — N186 End stage renal disease: Secondary | ICD-10-CM | POA: Diagnosis not present

## 2018-02-25 DIAGNOSIS — D631 Anemia in chronic kidney disease: Secondary | ICD-10-CM | POA: Diagnosis not present

## 2018-02-25 DIAGNOSIS — Z4931 Encounter for adequacy testing for hemodialysis: Secondary | ICD-10-CM | POA: Diagnosis not present

## 2018-02-25 DIAGNOSIS — I5022 Chronic systolic (congestive) heart failure: Secondary | ICD-10-CM | POA: Diagnosis not present

## 2018-02-26 DIAGNOSIS — Z79899 Other long term (current) drug therapy: Secondary | ICD-10-CM | POA: Diagnosis not present

## 2018-02-26 DIAGNOSIS — N186 End stage renal disease: Secondary | ICD-10-CM | POA: Diagnosis not present

## 2018-02-26 DIAGNOSIS — D509 Iron deficiency anemia, unspecified: Secondary | ICD-10-CM | POA: Diagnosis not present

## 2018-02-26 DIAGNOSIS — D631 Anemia in chronic kidney disease: Secondary | ICD-10-CM | POA: Diagnosis not present

## 2018-02-26 DIAGNOSIS — Z4931 Encounter for adequacy testing for hemodialysis: Secondary | ICD-10-CM | POA: Diagnosis not present

## 2018-02-26 DIAGNOSIS — R7881 Bacteremia: Secondary | ICD-10-CM | POA: Diagnosis not present

## 2018-02-28 DIAGNOSIS — D631 Anemia in chronic kidney disease: Secondary | ICD-10-CM | POA: Diagnosis not present

## 2018-02-28 DIAGNOSIS — D509 Iron deficiency anemia, unspecified: Secondary | ICD-10-CM | POA: Diagnosis not present

## 2018-02-28 DIAGNOSIS — Z79899 Other long term (current) drug therapy: Secondary | ICD-10-CM | POA: Diagnosis not present

## 2018-02-28 DIAGNOSIS — N186 End stage renal disease: Secondary | ICD-10-CM | POA: Diagnosis not present

## 2018-02-28 DIAGNOSIS — R7881 Bacteremia: Secondary | ICD-10-CM | POA: Diagnosis not present

## 2018-02-28 DIAGNOSIS — Z4931 Encounter for adequacy testing for hemodialysis: Secondary | ICD-10-CM | POA: Diagnosis not present

## 2018-03-02 DIAGNOSIS — R7881 Bacteremia: Secondary | ICD-10-CM | POA: Diagnosis not present

## 2018-03-02 DIAGNOSIS — D509 Iron deficiency anemia, unspecified: Secondary | ICD-10-CM | POA: Diagnosis not present

## 2018-03-02 DIAGNOSIS — Z4931 Encounter for adequacy testing for hemodialysis: Secondary | ICD-10-CM | POA: Diagnosis not present

## 2018-03-02 DIAGNOSIS — Z79899 Other long term (current) drug therapy: Secondary | ICD-10-CM | POA: Diagnosis not present

## 2018-03-02 DIAGNOSIS — D631 Anemia in chronic kidney disease: Secondary | ICD-10-CM | POA: Diagnosis not present

## 2018-03-02 DIAGNOSIS — N186 End stage renal disease: Secondary | ICD-10-CM | POA: Diagnosis not present

## 2018-03-04 DIAGNOSIS — N186 End stage renal disease: Secondary | ICD-10-CM | POA: Diagnosis not present

## 2018-03-04 DIAGNOSIS — R7881 Bacteremia: Secondary | ICD-10-CM | POA: Diagnosis not present

## 2018-03-04 DIAGNOSIS — Z4931 Encounter for adequacy testing for hemodialysis: Secondary | ICD-10-CM | POA: Diagnosis not present

## 2018-03-04 DIAGNOSIS — D509 Iron deficiency anemia, unspecified: Secondary | ICD-10-CM | POA: Diagnosis not present

## 2018-03-04 DIAGNOSIS — D631 Anemia in chronic kidney disease: Secondary | ICD-10-CM | POA: Diagnosis not present

## 2018-03-04 DIAGNOSIS — Z79899 Other long term (current) drug therapy: Secondary | ICD-10-CM | POA: Diagnosis not present

## 2018-03-05 DIAGNOSIS — Z79899 Other long term (current) drug therapy: Secondary | ICD-10-CM | POA: Diagnosis not present

## 2018-03-05 DIAGNOSIS — Z4931 Encounter for adequacy testing for hemodialysis: Secondary | ICD-10-CM | POA: Diagnosis not present

## 2018-03-05 DIAGNOSIS — D631 Anemia in chronic kidney disease: Secondary | ICD-10-CM | POA: Diagnosis not present

## 2018-03-05 DIAGNOSIS — N186 End stage renal disease: Secondary | ICD-10-CM | POA: Diagnosis not present

## 2018-03-05 DIAGNOSIS — D509 Iron deficiency anemia, unspecified: Secondary | ICD-10-CM | POA: Diagnosis not present

## 2018-03-05 DIAGNOSIS — R7881 Bacteremia: Secondary | ICD-10-CM | POA: Diagnosis not present

## 2018-03-06 DIAGNOSIS — R7881 Bacteremia: Secondary | ICD-10-CM | POA: Diagnosis not present

## 2018-03-06 DIAGNOSIS — D631 Anemia in chronic kidney disease: Secondary | ICD-10-CM | POA: Diagnosis not present

## 2018-03-06 DIAGNOSIS — Z79899 Other long term (current) drug therapy: Secondary | ICD-10-CM | POA: Diagnosis not present

## 2018-03-06 DIAGNOSIS — Z4931 Encounter for adequacy testing for hemodialysis: Secondary | ICD-10-CM | POA: Diagnosis not present

## 2018-03-06 DIAGNOSIS — D509 Iron deficiency anemia, unspecified: Secondary | ICD-10-CM | POA: Diagnosis not present

## 2018-03-06 DIAGNOSIS — N186 End stage renal disease: Secondary | ICD-10-CM | POA: Diagnosis not present

## 2018-03-07 DIAGNOSIS — N186 End stage renal disease: Secondary | ICD-10-CM | POA: Diagnosis not present

## 2018-03-07 DIAGNOSIS — Z4931 Encounter for adequacy testing for hemodialysis: Secondary | ICD-10-CM | POA: Diagnosis not present

## 2018-03-07 DIAGNOSIS — Z79899 Other long term (current) drug therapy: Secondary | ICD-10-CM | POA: Diagnosis not present

## 2018-03-07 DIAGNOSIS — D631 Anemia in chronic kidney disease: Secondary | ICD-10-CM | POA: Diagnosis not present

## 2018-03-07 DIAGNOSIS — D509 Iron deficiency anemia, unspecified: Secondary | ICD-10-CM | POA: Diagnosis not present

## 2018-03-07 DIAGNOSIS — R7881 Bacteremia: Secondary | ICD-10-CM | POA: Diagnosis not present

## 2018-03-09 DIAGNOSIS — D631 Anemia in chronic kidney disease: Secondary | ICD-10-CM | POA: Diagnosis not present

## 2018-03-09 DIAGNOSIS — Z79899 Other long term (current) drug therapy: Secondary | ICD-10-CM | POA: Diagnosis not present

## 2018-03-09 DIAGNOSIS — R7881 Bacteremia: Secondary | ICD-10-CM | POA: Diagnosis not present

## 2018-03-09 DIAGNOSIS — D509 Iron deficiency anemia, unspecified: Secondary | ICD-10-CM | POA: Diagnosis not present

## 2018-03-09 DIAGNOSIS — Z4931 Encounter for adequacy testing for hemodialysis: Secondary | ICD-10-CM | POA: Diagnosis not present

## 2018-03-09 DIAGNOSIS — N186 End stage renal disease: Secondary | ICD-10-CM | POA: Diagnosis not present

## 2018-03-10 DIAGNOSIS — Z79899 Other long term (current) drug therapy: Secondary | ICD-10-CM | POA: Diagnosis not present

## 2018-03-10 DIAGNOSIS — N2581 Secondary hyperparathyroidism of renal origin: Secondary | ICD-10-CM | POA: Diagnosis not present

## 2018-03-10 DIAGNOSIS — D631 Anemia in chronic kidney disease: Secondary | ICD-10-CM | POA: Diagnosis not present

## 2018-03-10 DIAGNOSIS — N059 Unspecified nephritic syndrome with unspecified morphologic changes: Secondary | ICD-10-CM | POA: Diagnosis not present

## 2018-03-10 DIAGNOSIS — N186 End stage renal disease: Secondary | ICD-10-CM | POA: Diagnosis not present

## 2018-03-10 DIAGNOSIS — Z992 Dependence on renal dialysis: Secondary | ICD-10-CM | POA: Diagnosis not present

## 2018-03-10 DIAGNOSIS — E876 Hypokalemia: Secondary | ICD-10-CM | POA: Diagnosis not present

## 2018-03-12 DIAGNOSIS — R5381 Other malaise: Secondary | ICD-10-CM | POA: Diagnosis not present

## 2018-03-12 DIAGNOSIS — N186 End stage renal disease: Secondary | ICD-10-CM | POA: Diagnosis not present

## 2018-03-12 DIAGNOSIS — D631 Anemia in chronic kidney disease: Secondary | ICD-10-CM | POA: Diagnosis not present

## 2018-03-12 DIAGNOSIS — E876 Hypokalemia: Secondary | ICD-10-CM | POA: Diagnosis not present

## 2018-03-12 DIAGNOSIS — N2581 Secondary hyperparathyroidism of renal origin: Secondary | ICD-10-CM | POA: Diagnosis not present

## 2018-03-12 DIAGNOSIS — Z79899 Other long term (current) drug therapy: Secondary | ICD-10-CM | POA: Diagnosis not present

## 2018-03-12 DIAGNOSIS — E7849 Other hyperlipidemia: Secondary | ICD-10-CM | POA: Diagnosis not present

## 2018-03-14 DIAGNOSIS — N186 End stage renal disease: Secondary | ICD-10-CM | POA: Diagnosis not present

## 2018-03-14 DIAGNOSIS — D631 Anemia in chronic kidney disease: Secondary | ICD-10-CM | POA: Diagnosis not present

## 2018-03-14 DIAGNOSIS — M8580 Other specified disorders of bone density and structure, unspecified site: Secondary | ICD-10-CM | POA: Diagnosis not present

## 2018-03-14 DIAGNOSIS — E876 Hypokalemia: Secondary | ICD-10-CM | POA: Diagnosis not present

## 2018-03-14 DIAGNOSIS — Z170 Estrogen receptor positive status [ER+]: Secondary | ICD-10-CM | POA: Diagnosis not present

## 2018-03-14 DIAGNOSIS — Z79899 Other long term (current) drug therapy: Secondary | ICD-10-CM

## 2018-03-14 DIAGNOSIS — Z853 Personal history of malignant neoplasm of breast: Secondary | ICD-10-CM | POA: Diagnosis not present

## 2018-03-14 DIAGNOSIS — N2581 Secondary hyperparathyroidism of renal origin: Secondary | ICD-10-CM | POA: Diagnosis not present

## 2018-03-14 DIAGNOSIS — C50919 Malignant neoplasm of unspecified site of unspecified female breast: Secondary | ICD-10-CM | POA: Diagnosis not present

## 2018-03-14 DIAGNOSIS — M8589 Other specified disorders of bone density and structure, multiple sites: Secondary | ICD-10-CM | POA: Diagnosis not present

## 2018-03-14 DIAGNOSIS — Z79811 Long term (current) use of aromatase inhibitors: Secondary | ICD-10-CM | POA: Diagnosis not present

## 2018-03-16 DIAGNOSIS — Z79899 Other long term (current) drug therapy: Secondary | ICD-10-CM | POA: Diagnosis not present

## 2018-03-16 DIAGNOSIS — N2581 Secondary hyperparathyroidism of renal origin: Secondary | ICD-10-CM | POA: Diagnosis not present

## 2018-03-16 DIAGNOSIS — N186 End stage renal disease: Secondary | ICD-10-CM | POA: Diagnosis not present

## 2018-03-16 DIAGNOSIS — D631 Anemia in chronic kidney disease: Secondary | ICD-10-CM | POA: Diagnosis not present

## 2018-03-16 DIAGNOSIS — E876 Hypokalemia: Secondary | ICD-10-CM | POA: Diagnosis not present

## 2018-03-18 DIAGNOSIS — E876 Hypokalemia: Secondary | ICD-10-CM | POA: Diagnosis not present

## 2018-03-18 DIAGNOSIS — N2581 Secondary hyperparathyroidism of renal origin: Secondary | ICD-10-CM | POA: Diagnosis not present

## 2018-03-18 DIAGNOSIS — Z79899 Other long term (current) drug therapy: Secondary | ICD-10-CM | POA: Diagnosis not present

## 2018-03-18 DIAGNOSIS — D631 Anemia in chronic kidney disease: Secondary | ICD-10-CM | POA: Diagnosis not present

## 2018-03-18 DIAGNOSIS — N186 End stage renal disease: Secondary | ICD-10-CM | POA: Diagnosis not present

## 2018-03-20 DIAGNOSIS — L821 Other seborrheic keratosis: Secondary | ICD-10-CM | POA: Diagnosis not present

## 2018-03-20 DIAGNOSIS — D631 Anemia in chronic kidney disease: Secondary | ICD-10-CM | POA: Diagnosis not present

## 2018-03-20 DIAGNOSIS — N186 End stage renal disease: Secondary | ICD-10-CM | POA: Diagnosis not present

## 2018-03-20 DIAGNOSIS — N2581 Secondary hyperparathyroidism of renal origin: Secondary | ICD-10-CM | POA: Diagnosis not present

## 2018-03-20 DIAGNOSIS — E876 Hypokalemia: Secondary | ICD-10-CM | POA: Diagnosis not present

## 2018-03-20 DIAGNOSIS — Z79899 Other long term (current) drug therapy: Secondary | ICD-10-CM | POA: Diagnosis not present

## 2018-03-20 DIAGNOSIS — L578 Other skin changes due to chronic exposure to nonionizing radiation: Secondary | ICD-10-CM | POA: Diagnosis not present

## 2018-03-20 DIAGNOSIS — L570 Actinic keratosis: Secondary | ICD-10-CM | POA: Diagnosis not present

## 2018-03-22 DIAGNOSIS — N2581 Secondary hyperparathyroidism of renal origin: Secondary | ICD-10-CM | POA: Diagnosis not present

## 2018-03-22 DIAGNOSIS — Z79899 Other long term (current) drug therapy: Secondary | ICD-10-CM | POA: Diagnosis not present

## 2018-03-22 DIAGNOSIS — N186 End stage renal disease: Secondary | ICD-10-CM | POA: Diagnosis not present

## 2018-03-22 DIAGNOSIS — E876 Hypokalemia: Secondary | ICD-10-CM | POA: Diagnosis not present

## 2018-03-22 DIAGNOSIS — D631 Anemia in chronic kidney disease: Secondary | ICD-10-CM | POA: Diagnosis not present

## 2018-03-24 DIAGNOSIS — E876 Hypokalemia: Secondary | ICD-10-CM | POA: Diagnosis not present

## 2018-03-24 DIAGNOSIS — N186 End stage renal disease: Secondary | ICD-10-CM | POA: Diagnosis not present

## 2018-03-24 DIAGNOSIS — N2581 Secondary hyperparathyroidism of renal origin: Secondary | ICD-10-CM | POA: Diagnosis not present

## 2018-03-24 DIAGNOSIS — Z79899 Other long term (current) drug therapy: Secondary | ICD-10-CM | POA: Diagnosis not present

## 2018-03-24 DIAGNOSIS — D631 Anemia in chronic kidney disease: Secondary | ICD-10-CM | POA: Diagnosis not present

## 2018-03-25 DIAGNOSIS — R197 Diarrhea, unspecified: Secondary | ICD-10-CM | POA: Diagnosis not present

## 2018-03-25 DIAGNOSIS — E785 Hyperlipidemia, unspecified: Secondary | ICD-10-CM | POA: Diagnosis not present

## 2018-03-25 DIAGNOSIS — I82409 Acute embolism and thrombosis of unspecified deep veins of unspecified lower extremity: Secondary | ICD-10-CM | POA: Diagnosis not present

## 2018-03-25 DIAGNOSIS — Z1339 Encounter for screening examination for other mental health and behavioral disorders: Secondary | ICD-10-CM | POA: Diagnosis not present

## 2018-03-25 DIAGNOSIS — N186 End stage renal disease: Secondary | ICD-10-CM | POA: Diagnosis not present

## 2018-03-25 DIAGNOSIS — E1121 Type 2 diabetes mellitus with diabetic nephropathy: Secondary | ICD-10-CM | POA: Diagnosis not present

## 2018-03-25 DIAGNOSIS — J449 Chronic obstructive pulmonary disease, unspecified: Secondary | ICD-10-CM | POA: Diagnosis not present

## 2018-03-25 DIAGNOSIS — M25561 Pain in right knee: Secondary | ICD-10-CM | POA: Diagnosis not present

## 2018-03-25 DIAGNOSIS — I10 Essential (primary) hypertension: Secondary | ICD-10-CM | POA: Diagnosis not present

## 2018-03-26 DIAGNOSIS — Z79899 Other long term (current) drug therapy: Secondary | ICD-10-CM | POA: Diagnosis not present

## 2018-03-26 DIAGNOSIS — N186 End stage renal disease: Secondary | ICD-10-CM | POA: Diagnosis not present

## 2018-03-26 DIAGNOSIS — N2581 Secondary hyperparathyroidism of renal origin: Secondary | ICD-10-CM | POA: Diagnosis not present

## 2018-03-26 DIAGNOSIS — E876 Hypokalemia: Secondary | ICD-10-CM | POA: Diagnosis not present

## 2018-03-26 DIAGNOSIS — D631 Anemia in chronic kidney disease: Secondary | ICD-10-CM | POA: Diagnosis not present

## 2018-03-28 DIAGNOSIS — E876 Hypokalemia: Secondary | ICD-10-CM | POA: Diagnosis not present

## 2018-03-28 DIAGNOSIS — N2581 Secondary hyperparathyroidism of renal origin: Secondary | ICD-10-CM | POA: Diagnosis not present

## 2018-03-28 DIAGNOSIS — N186 End stage renal disease: Secondary | ICD-10-CM | POA: Diagnosis not present

## 2018-03-28 DIAGNOSIS — D631 Anemia in chronic kidney disease: Secondary | ICD-10-CM | POA: Diagnosis not present

## 2018-03-28 DIAGNOSIS — Z79899 Other long term (current) drug therapy: Secondary | ICD-10-CM | POA: Diagnosis not present

## 2018-03-30 DIAGNOSIS — Z79899 Other long term (current) drug therapy: Secondary | ICD-10-CM | POA: Diagnosis not present

## 2018-03-30 DIAGNOSIS — N2581 Secondary hyperparathyroidism of renal origin: Secondary | ICD-10-CM | POA: Diagnosis not present

## 2018-03-30 DIAGNOSIS — N186 End stage renal disease: Secondary | ICD-10-CM | POA: Diagnosis not present

## 2018-03-30 DIAGNOSIS — E876 Hypokalemia: Secondary | ICD-10-CM | POA: Diagnosis not present

## 2018-03-30 DIAGNOSIS — D631 Anemia in chronic kidney disease: Secondary | ICD-10-CM | POA: Diagnosis not present

## 2018-04-01 DIAGNOSIS — D631 Anemia in chronic kidney disease: Secondary | ICD-10-CM | POA: Diagnosis not present

## 2018-04-01 DIAGNOSIS — N2581 Secondary hyperparathyroidism of renal origin: Secondary | ICD-10-CM | POA: Diagnosis not present

## 2018-04-01 DIAGNOSIS — E876 Hypokalemia: Secondary | ICD-10-CM | POA: Diagnosis not present

## 2018-04-01 DIAGNOSIS — N186 End stage renal disease: Secondary | ICD-10-CM | POA: Diagnosis not present

## 2018-04-01 DIAGNOSIS — Z79899 Other long term (current) drug therapy: Secondary | ICD-10-CM | POA: Diagnosis not present

## 2018-04-02 DIAGNOSIS — E1121 Type 2 diabetes mellitus with diabetic nephropathy: Secondary | ICD-10-CM | POA: Diagnosis not present

## 2018-04-02 DIAGNOSIS — J449 Chronic obstructive pulmonary disease, unspecified: Secondary | ICD-10-CM | POA: Diagnosis not present

## 2018-04-02 DIAGNOSIS — N186 End stage renal disease: Secondary | ICD-10-CM | POA: Diagnosis not present

## 2018-04-02 DIAGNOSIS — E876 Hypokalemia: Secondary | ICD-10-CM | POA: Diagnosis not present

## 2018-04-02 DIAGNOSIS — D631 Anemia in chronic kidney disease: Secondary | ICD-10-CM | POA: Diagnosis not present

## 2018-04-02 DIAGNOSIS — Z23 Encounter for immunization: Secondary | ICD-10-CM | POA: Diagnosis not present

## 2018-04-02 DIAGNOSIS — M25562 Pain in left knee: Secondary | ICD-10-CM | POA: Diagnosis not present

## 2018-04-02 DIAGNOSIS — N2581 Secondary hyperparathyroidism of renal origin: Secondary | ICD-10-CM | POA: Diagnosis not present

## 2018-04-02 DIAGNOSIS — Z79899 Other long term (current) drug therapy: Secondary | ICD-10-CM | POA: Diagnosis not present

## 2018-04-02 DIAGNOSIS — I82409 Acute embolism and thrombosis of unspecified deep veins of unspecified lower extremity: Secondary | ICD-10-CM | POA: Diagnosis not present

## 2018-04-04 DIAGNOSIS — E876 Hypokalemia: Secondary | ICD-10-CM | POA: Diagnosis not present

## 2018-04-04 DIAGNOSIS — D631 Anemia in chronic kidney disease: Secondary | ICD-10-CM | POA: Diagnosis not present

## 2018-04-04 DIAGNOSIS — Z79899 Other long term (current) drug therapy: Secondary | ICD-10-CM | POA: Diagnosis not present

## 2018-04-04 DIAGNOSIS — N186 End stage renal disease: Secondary | ICD-10-CM | POA: Diagnosis not present

## 2018-04-04 DIAGNOSIS — N2581 Secondary hyperparathyroidism of renal origin: Secondary | ICD-10-CM | POA: Diagnosis not present

## 2018-04-06 DIAGNOSIS — N2581 Secondary hyperparathyroidism of renal origin: Secondary | ICD-10-CM | POA: Diagnosis not present

## 2018-04-06 DIAGNOSIS — Z79899 Other long term (current) drug therapy: Secondary | ICD-10-CM | POA: Diagnosis not present

## 2018-04-06 DIAGNOSIS — D631 Anemia in chronic kidney disease: Secondary | ICD-10-CM | POA: Diagnosis not present

## 2018-04-06 DIAGNOSIS — N186 End stage renal disease: Secondary | ICD-10-CM | POA: Diagnosis not present

## 2018-04-06 DIAGNOSIS — E876 Hypokalemia: Secondary | ICD-10-CM | POA: Diagnosis not present

## 2018-04-08 DIAGNOSIS — Z79899 Other long term (current) drug therapy: Secondary | ICD-10-CM | POA: Diagnosis not present

## 2018-04-08 DIAGNOSIS — N186 End stage renal disease: Secondary | ICD-10-CM | POA: Diagnosis not present

## 2018-04-08 DIAGNOSIS — D631 Anemia in chronic kidney disease: Secondary | ICD-10-CM | POA: Diagnosis not present

## 2018-04-08 DIAGNOSIS — E876 Hypokalemia: Secondary | ICD-10-CM | POA: Diagnosis not present

## 2018-04-08 DIAGNOSIS — N2581 Secondary hyperparathyroidism of renal origin: Secondary | ICD-10-CM | POA: Diagnosis not present

## 2018-04-09 DIAGNOSIS — N186 End stage renal disease: Secondary | ICD-10-CM | POA: Diagnosis not present

## 2018-04-09 DIAGNOSIS — D509 Iron deficiency anemia, unspecified: Secondary | ICD-10-CM | POA: Diagnosis not present

## 2018-04-09 DIAGNOSIS — Z992 Dependence on renal dialysis: Secondary | ICD-10-CM | POA: Diagnosis not present

## 2018-04-09 DIAGNOSIS — N059 Unspecified nephritic syndrome with unspecified morphologic changes: Secondary | ICD-10-CM | POA: Diagnosis not present

## 2018-04-09 DIAGNOSIS — Z79899 Other long term (current) drug therapy: Secondary | ICD-10-CM | POA: Diagnosis not present

## 2018-04-09 DIAGNOSIS — N2581 Secondary hyperparathyroidism of renal origin: Secondary | ICD-10-CM | POA: Diagnosis not present

## 2018-04-11 DIAGNOSIS — Z79899 Other long term (current) drug therapy: Secondary | ICD-10-CM | POA: Diagnosis not present

## 2018-04-11 DIAGNOSIS — N186 End stage renal disease: Secondary | ICD-10-CM | POA: Diagnosis not present

## 2018-04-11 DIAGNOSIS — N2581 Secondary hyperparathyroidism of renal origin: Secondary | ICD-10-CM | POA: Diagnosis not present

## 2018-04-11 DIAGNOSIS — D509 Iron deficiency anemia, unspecified: Secondary | ICD-10-CM | POA: Diagnosis not present

## 2018-04-12 DIAGNOSIS — N2581 Secondary hyperparathyroidism of renal origin: Secondary | ICD-10-CM | POA: Diagnosis not present

## 2018-04-12 DIAGNOSIS — R5381 Other malaise: Secondary | ICD-10-CM | POA: Diagnosis not present

## 2018-04-12 DIAGNOSIS — N186 End stage renal disease: Secondary | ICD-10-CM | POA: Diagnosis not present

## 2018-04-12 DIAGNOSIS — E7849 Other hyperlipidemia: Secondary | ICD-10-CM | POA: Diagnosis not present

## 2018-04-12 DIAGNOSIS — Z79899 Other long term (current) drug therapy: Secondary | ICD-10-CM | POA: Diagnosis not present

## 2018-04-12 DIAGNOSIS — D509 Iron deficiency anemia, unspecified: Secondary | ICD-10-CM | POA: Diagnosis not present

## 2018-04-13 DIAGNOSIS — D509 Iron deficiency anemia, unspecified: Secondary | ICD-10-CM | POA: Diagnosis not present

## 2018-04-13 DIAGNOSIS — Z79899 Other long term (current) drug therapy: Secondary | ICD-10-CM | POA: Diagnosis not present

## 2018-04-13 DIAGNOSIS — N186 End stage renal disease: Secondary | ICD-10-CM | POA: Diagnosis not present

## 2018-04-13 DIAGNOSIS — N2581 Secondary hyperparathyroidism of renal origin: Secondary | ICD-10-CM | POA: Diagnosis not present

## 2018-04-15 DIAGNOSIS — N2581 Secondary hyperparathyroidism of renal origin: Secondary | ICD-10-CM | POA: Diagnosis not present

## 2018-04-15 DIAGNOSIS — D509 Iron deficiency anemia, unspecified: Secondary | ICD-10-CM | POA: Diagnosis not present

## 2018-04-15 DIAGNOSIS — N186 End stage renal disease: Secondary | ICD-10-CM | POA: Diagnosis not present

## 2018-04-15 DIAGNOSIS — Z79899 Other long term (current) drug therapy: Secondary | ICD-10-CM | POA: Diagnosis not present

## 2018-04-16 DIAGNOSIS — N2581 Secondary hyperparathyroidism of renal origin: Secondary | ICD-10-CM | POA: Diagnosis not present

## 2018-04-16 DIAGNOSIS — Z79899 Other long term (current) drug therapy: Secondary | ICD-10-CM | POA: Diagnosis not present

## 2018-04-16 DIAGNOSIS — D509 Iron deficiency anemia, unspecified: Secondary | ICD-10-CM | POA: Diagnosis not present

## 2018-04-16 DIAGNOSIS — N186 End stage renal disease: Secondary | ICD-10-CM | POA: Diagnosis not present

## 2018-04-17 DIAGNOSIS — N186 End stage renal disease: Secondary | ICD-10-CM | POA: Diagnosis not present

## 2018-04-17 DIAGNOSIS — N2581 Secondary hyperparathyroidism of renal origin: Secondary | ICD-10-CM | POA: Diagnosis not present

## 2018-04-17 DIAGNOSIS — Z79899 Other long term (current) drug therapy: Secondary | ICD-10-CM | POA: Diagnosis not present

## 2018-04-17 DIAGNOSIS — D509 Iron deficiency anemia, unspecified: Secondary | ICD-10-CM | POA: Diagnosis not present

## 2018-04-19 DIAGNOSIS — Z79899 Other long term (current) drug therapy: Secondary | ICD-10-CM | POA: Diagnosis not present

## 2018-04-19 DIAGNOSIS — N186 End stage renal disease: Secondary | ICD-10-CM | POA: Diagnosis not present

## 2018-04-19 DIAGNOSIS — D509 Iron deficiency anemia, unspecified: Secondary | ICD-10-CM | POA: Diagnosis not present

## 2018-04-19 DIAGNOSIS — N2581 Secondary hyperparathyroidism of renal origin: Secondary | ICD-10-CM | POA: Diagnosis not present

## 2018-04-21 DIAGNOSIS — N186 End stage renal disease: Secondary | ICD-10-CM | POA: Diagnosis not present

## 2018-04-21 DIAGNOSIS — D509 Iron deficiency anemia, unspecified: Secondary | ICD-10-CM | POA: Diagnosis not present

## 2018-04-21 DIAGNOSIS — N2581 Secondary hyperparathyroidism of renal origin: Secondary | ICD-10-CM | POA: Diagnosis not present

## 2018-04-21 DIAGNOSIS — Z79899 Other long term (current) drug therapy: Secondary | ICD-10-CM | POA: Diagnosis not present

## 2018-04-23 DIAGNOSIS — N2581 Secondary hyperparathyroidism of renal origin: Secondary | ICD-10-CM | POA: Diagnosis not present

## 2018-04-23 DIAGNOSIS — N186 End stage renal disease: Secondary | ICD-10-CM | POA: Diagnosis not present

## 2018-04-23 DIAGNOSIS — Z79899 Other long term (current) drug therapy: Secondary | ICD-10-CM | POA: Diagnosis not present

## 2018-04-23 DIAGNOSIS — D509 Iron deficiency anemia, unspecified: Secondary | ICD-10-CM | POA: Diagnosis not present

## 2018-04-24 DIAGNOSIS — N2581 Secondary hyperparathyroidism of renal origin: Secondary | ICD-10-CM | POA: Diagnosis not present

## 2018-04-24 DIAGNOSIS — Z79899 Other long term (current) drug therapy: Secondary | ICD-10-CM | POA: Diagnosis not present

## 2018-04-24 DIAGNOSIS — N186 End stage renal disease: Secondary | ICD-10-CM | POA: Diagnosis not present

## 2018-04-24 DIAGNOSIS — D509 Iron deficiency anemia, unspecified: Secondary | ICD-10-CM | POA: Diagnosis not present

## 2018-04-25 DIAGNOSIS — N2581 Secondary hyperparathyroidism of renal origin: Secondary | ICD-10-CM | POA: Diagnosis not present

## 2018-04-25 DIAGNOSIS — D509 Iron deficiency anemia, unspecified: Secondary | ICD-10-CM | POA: Diagnosis not present

## 2018-04-25 DIAGNOSIS — Z79899 Other long term (current) drug therapy: Secondary | ICD-10-CM | POA: Diagnosis not present

## 2018-04-25 DIAGNOSIS — N186 End stage renal disease: Secondary | ICD-10-CM | POA: Diagnosis not present

## 2018-04-27 DIAGNOSIS — N186 End stage renal disease: Secondary | ICD-10-CM | POA: Diagnosis not present

## 2018-04-27 DIAGNOSIS — Z79899 Other long term (current) drug therapy: Secondary | ICD-10-CM | POA: Diagnosis not present

## 2018-04-27 DIAGNOSIS — N2581 Secondary hyperparathyroidism of renal origin: Secondary | ICD-10-CM | POA: Diagnosis not present

## 2018-04-27 DIAGNOSIS — D509 Iron deficiency anemia, unspecified: Secondary | ICD-10-CM | POA: Diagnosis not present

## 2018-04-29 DIAGNOSIS — N2581 Secondary hyperparathyroidism of renal origin: Secondary | ICD-10-CM | POA: Diagnosis not present

## 2018-04-29 DIAGNOSIS — D509 Iron deficiency anemia, unspecified: Secondary | ICD-10-CM | POA: Diagnosis not present

## 2018-04-29 DIAGNOSIS — Z79899 Other long term (current) drug therapy: Secondary | ICD-10-CM | POA: Diagnosis not present

## 2018-04-29 DIAGNOSIS — N186 End stage renal disease: Secondary | ICD-10-CM | POA: Diagnosis not present

## 2018-05-01 ENCOUNTER — Ambulatory Visit (INDEPENDENT_AMBULATORY_CARE_PROVIDER_SITE_OTHER): Payer: Medicare Other | Admitting: Sports Medicine

## 2018-05-01 ENCOUNTER — Encounter: Payer: Self-pay | Admitting: Sports Medicine

## 2018-05-01 DIAGNOSIS — B351 Tinea unguium: Secondary | ICD-10-CM

## 2018-05-01 DIAGNOSIS — R0602 Shortness of breath: Secondary | ICD-10-CM | POA: Insufficient documentation

## 2018-05-01 DIAGNOSIS — E1140 Type 2 diabetes mellitus with diabetic neuropathy, unspecified: Secondary | ICD-10-CM

## 2018-05-01 DIAGNOSIS — Z79899 Other long term (current) drug therapy: Secondary | ICD-10-CM | POA: Diagnosis not present

## 2018-05-01 DIAGNOSIS — N186 End stage renal disease: Secondary | ICD-10-CM

## 2018-05-01 DIAGNOSIS — N2581 Secondary hyperparathyroidism of renal origin: Secondary | ICD-10-CM | POA: Diagnosis not present

## 2018-05-01 DIAGNOSIS — M79675 Pain in left toe(s): Secondary | ICD-10-CM | POA: Diagnosis not present

## 2018-05-01 DIAGNOSIS — M79674 Pain in right toe(s): Secondary | ICD-10-CM

## 2018-05-01 DIAGNOSIS — Z992 Dependence on renal dialysis: Secondary | ICD-10-CM

## 2018-05-01 DIAGNOSIS — D509 Iron deficiency anemia, unspecified: Secondary | ICD-10-CM | POA: Diagnosis not present

## 2018-05-01 NOTE — Progress Notes (Signed)
Subjective: Linda Jordan is a 84 y.o. female patient with history of diabetes who presents to office today complaining of long, painful nails  while ambulating in shoes; unable to trim. Patient states that the glucose readings are not checked.  Patient reports that she is still on dialysis and has to follow-up with a nephrologist next week.  Denies any acute changes or problems since last visit.  Patient Active Problem List   Diagnosis Date Noted  . Shortness of breath 05/01/2018  . Chronic systolic congestive heart failure ([place]) 01/12/2018  . CAD in native artery 01/11/2018  . Abnormal echocardiogram 01/07/2018  . Nonrheumatic aortic valve stenosis 01/07/2018  . Left leg swelling 10/13/2015  . Mass of abdomen 10/13/2015  . PD catheter dysfunction ([place]) 10/13/2015  . Cough 04/14/2015  . ESRD (end stage renal disease) on dialysis ([place]) 10/17/2014  . Bacterial peritonitis ([place]) 07/05/2013  . Thyroid disease 05/28/2013  . Hypertension 05/28/2013  . Arthritis 02/16/2011  . Bilateral swelling of feet 02/16/2011  . Mycotic toenails 02/16/2011  . Nephrotic syndrome with membranous glomerulonephritis 07/02/2010   Current Outpatient Medications on File Prior to Visit  Medication Sig Dispense Refill  . Acetaminophen (TYLENOL EX ST ARTHRITIS PAIN PO) Take by mouth.      Marland Kitchen amLODipine (NORVASC) 10 MG tablet Take 10 mg by mouth daily.      Marland Kitchen aspirin EC 81 MG tablet Take 81 mg by mouth.    Marland Kitchen atorvastatin (LIPITOR) 80 MG tablet Take 80 mg by mouth.    Marland Kitchen b complex vitamins capsule Take by mouth.    Marland Kitchen buPROPion (WELLBUTRIN SR) 100 MG 12 hr tablet TAKE 1 TABLET (100 MG TOTAL) BY MOUTH DAILY.  4  . carvedilol (COREG) 3.125 MG tablet Take 3.125 mg by mouth.    . cetirizine (ZYRTEC) 10 MG tablet 1 (ONE) TABLET BY MOUTH AT BEDTIME  12  . hydrochlorothiazide (HYDRODIURIL) 25 MG tablet Take 25 mg by mouth daily.    Marland Kitchen letrozole (FEMARA) 2.5 MG tablet 1 TABLET ONCE DAILY - TAKE DAILY FOR HORMONAL PROTECTION  AGAINST FUTURE BREAST CANCER  3  . levothyroxine (SYNTHROID) 25 MCG tablet Take by mouth.    . losartan (COZAAR) 50 MG tablet TAKE 1 TABLET BY MOUTH AT BEDTIME (STOP LISINOPRIL)  11  . metoprolol (LOPRESSOR) 50 MG tablet Take 50 mg by mouth 2 (two) times daily.    . Multiple Vitamin (MULTIVITAMIN) tablet Take 1 tablet by mouth daily.    . Nebivolol HCl (BYSTOLIC) 20 MG TABS Take by mouth.      . Omega-3 Fatty Acids (FISH OIL CONCENTRATE) 300 MG CAPS Take by mouth.    Marland Kitchen omeprazole (PRILOSEC) 40 MG capsule Take 40 mg by mouth daily.      Marland Kitchen oxycodone (OXY-IR) 5 MG capsule TAKE ONE TO TWO CAPSULES BY MOUTH EVERY 4 HOURS AS NEEDED FOR PAIN  0  . pregabalin (LYRICA) 75 MG capsule Take 75 mg by mouth 2 (two) times daily.      . rosuvastatin (CRESTOR) 10 MG tablet Take 10 mg by mouth daily.      Marland Kitchen spironolactone (ALDACTONE) 100 MG tablet Take 100 mg by mouth.    . warfarin (COUMADIN) 2 MG tablet Take 2 mg by mouth daily.       No current facility-administered medications on file prior to visit.    Allergies  Allergen Reactions  . Codeine Nausea And Vomiting  . Other   . Sulfa Antibiotics Nausea And Vomiting  .  Tape     No results found for this or any previous visit (from the past 2160 hour(s)).  Objective: General: Patient is awake, alert, and oriented x 3 and in no acute distress.  Integument: Skin is warm, dry and supple bilateral. Nails are tender, long, thickened and dystrophic with subungual debris, consistent with onychomycosis, 1-5 bilateral. No signs of infection. No open lesions or preulcerative lesions present bilateral. Remaining integument unremarkable.  Vasculature:  Dorsalis Pedis pulse 1/4 bilateral. Posterior Tibial pulse  1/4 bilateral.  Capillary fill time <3 sec 1-5 bilateral. Scant hair growth to the level of the digits. Temperature gradient within normal limits. Trace varicosities present bilateral. No edema present bilateral.   Neurology: The patient has absent  sensation measured with a 5.07/10g Semmes Weinstein Monofilament at all pedal sites bilateral . Vibratory sensation absent bilateral with tuning fork. No Babinski sign present bilateral.   Musculoskeletal: Asymptomatic hammertoes pedal deformities noted bilateral. Muscular strength 5/5 in all lower extremity muscular groups bilateral without pain on range of motion . No tenderness with calf compression bilateral.  Assessment and Plan: Problem List Items Addressed This Visit    None    Visit Diagnoses    Pain due to onychomycosis of toenails of both feet    -  Primary   Type 2 diabetes, controlled, with neuropathy ([place])       ESRD on hemodialysis ([place])         -Examined patient. -Mechanically debrided all nails 1-5 bilateral using sterile nail nipper and filed with dremel without incident -Patient to return  in 3 months for at risk foot care -Continue with nephrology follow-up -Patient advised to call the office if any problems or questions arise in the meantime.  Landis Martins, DPM

## 2018-05-02 DIAGNOSIS — N186 End stage renal disease: Secondary | ICD-10-CM | POA: Diagnosis not present

## 2018-05-02 DIAGNOSIS — N2581 Secondary hyperparathyroidism of renal origin: Secondary | ICD-10-CM | POA: Diagnosis not present

## 2018-05-02 DIAGNOSIS — D509 Iron deficiency anemia, unspecified: Secondary | ICD-10-CM | POA: Diagnosis not present

## 2018-05-02 DIAGNOSIS — Z79899 Other long term (current) drug therapy: Secondary | ICD-10-CM | POA: Diagnosis not present

## 2018-05-03 DIAGNOSIS — Z79899 Other long term (current) drug therapy: Secondary | ICD-10-CM | POA: Diagnosis not present

## 2018-05-03 DIAGNOSIS — N2581 Secondary hyperparathyroidism of renal origin: Secondary | ICD-10-CM | POA: Diagnosis not present

## 2018-05-03 DIAGNOSIS — N186 End stage renal disease: Secondary | ICD-10-CM | POA: Diagnosis not present

## 2018-05-03 DIAGNOSIS — D509 Iron deficiency anemia, unspecified: Secondary | ICD-10-CM | POA: Diagnosis not present

## 2018-05-05 DIAGNOSIS — N186 End stage renal disease: Secondary | ICD-10-CM | POA: Diagnosis not present

## 2018-05-05 DIAGNOSIS — D509 Iron deficiency anemia, unspecified: Secondary | ICD-10-CM | POA: Diagnosis not present

## 2018-05-05 DIAGNOSIS — N2581 Secondary hyperparathyroidism of renal origin: Secondary | ICD-10-CM | POA: Diagnosis not present

## 2018-05-05 DIAGNOSIS — Z79899 Other long term (current) drug therapy: Secondary | ICD-10-CM | POA: Diagnosis not present

## 2018-05-07 DIAGNOSIS — N2581 Secondary hyperparathyroidism of renal origin: Secondary | ICD-10-CM | POA: Diagnosis not present

## 2018-05-07 DIAGNOSIS — Z79899 Other long term (current) drug therapy: Secondary | ICD-10-CM | POA: Diagnosis not present

## 2018-05-07 DIAGNOSIS — N186 End stage renal disease: Secondary | ICD-10-CM | POA: Diagnosis not present

## 2018-05-07 DIAGNOSIS — D509 Iron deficiency anemia, unspecified: Secondary | ICD-10-CM | POA: Diagnosis not present

## 2018-05-08 DIAGNOSIS — D509 Iron deficiency anemia, unspecified: Secondary | ICD-10-CM | POA: Diagnosis not present

## 2018-05-08 DIAGNOSIS — N186 End stage renal disease: Secondary | ICD-10-CM | POA: Diagnosis not present

## 2018-05-08 DIAGNOSIS — Z79899 Other long term (current) drug therapy: Secondary | ICD-10-CM | POA: Diagnosis not present

## 2018-05-08 DIAGNOSIS — N2581 Secondary hyperparathyroidism of renal origin: Secondary | ICD-10-CM | POA: Diagnosis not present

## 2018-05-09 DIAGNOSIS — Z79899 Other long term (current) drug therapy: Secondary | ICD-10-CM | POA: Diagnosis not present

## 2018-05-09 DIAGNOSIS — N2581 Secondary hyperparathyroidism of renal origin: Secondary | ICD-10-CM | POA: Diagnosis not present

## 2018-05-09 DIAGNOSIS — N186 End stage renal disease: Secondary | ICD-10-CM | POA: Diagnosis not present

## 2018-05-09 DIAGNOSIS — D509 Iron deficiency anemia, unspecified: Secondary | ICD-10-CM | POA: Diagnosis not present

## 2018-05-10 DIAGNOSIS — N059 Unspecified nephritic syndrome with unspecified morphologic changes: Secondary | ICD-10-CM | POA: Diagnosis not present

## 2018-05-10 DIAGNOSIS — Z992 Dependence on renal dialysis: Secondary | ICD-10-CM | POA: Diagnosis not present

## 2018-05-10 DIAGNOSIS — N186 End stage renal disease: Secondary | ICD-10-CM | POA: Diagnosis not present

## 2018-05-11 DIAGNOSIS — N186 End stage renal disease: Secondary | ICD-10-CM | POA: Diagnosis not present

## 2018-05-11 DIAGNOSIS — N2581 Secondary hyperparathyroidism of renal origin: Secondary | ICD-10-CM | POA: Diagnosis not present

## 2018-05-11 DIAGNOSIS — Z79899 Other long term (current) drug therapy: Secondary | ICD-10-CM | POA: Diagnosis not present

## 2018-05-11 DIAGNOSIS — D509 Iron deficiency anemia, unspecified: Secondary | ICD-10-CM | POA: Diagnosis not present

## 2018-05-11 DIAGNOSIS — D631 Anemia in chronic kidney disease: Secondary | ICD-10-CM | POA: Diagnosis not present

## 2018-05-13 DIAGNOSIS — Z79899 Other long term (current) drug therapy: Secondary | ICD-10-CM | POA: Diagnosis not present

## 2018-05-13 DIAGNOSIS — D631 Anemia in chronic kidney disease: Secondary | ICD-10-CM | POA: Diagnosis not present

## 2018-05-13 DIAGNOSIS — N2581 Secondary hyperparathyroidism of renal origin: Secondary | ICD-10-CM | POA: Diagnosis not present

## 2018-05-13 DIAGNOSIS — N186 End stage renal disease: Secondary | ICD-10-CM | POA: Diagnosis not present

## 2018-05-13 DIAGNOSIS — D509 Iron deficiency anemia, unspecified: Secondary | ICD-10-CM | POA: Diagnosis not present

## 2018-05-15 DIAGNOSIS — D631 Anemia in chronic kidney disease: Secondary | ICD-10-CM | POA: Diagnosis not present

## 2018-05-15 DIAGNOSIS — D509 Iron deficiency anemia, unspecified: Secondary | ICD-10-CM | POA: Diagnosis not present

## 2018-05-15 DIAGNOSIS — N2581 Secondary hyperparathyroidism of renal origin: Secondary | ICD-10-CM | POA: Diagnosis not present

## 2018-05-15 DIAGNOSIS — N186 End stage renal disease: Secondary | ICD-10-CM | POA: Diagnosis not present

## 2018-05-15 DIAGNOSIS — Z79899 Other long term (current) drug therapy: Secondary | ICD-10-CM | POA: Diagnosis not present

## 2018-05-16 DIAGNOSIS — I5022 Chronic systolic (congestive) heart failure: Secondary | ICD-10-CM | POA: Diagnosis not present

## 2018-05-17 DIAGNOSIS — Z79899 Other long term (current) drug therapy: Secondary | ICD-10-CM | POA: Diagnosis not present

## 2018-05-17 DIAGNOSIS — D631 Anemia in chronic kidney disease: Secondary | ICD-10-CM | POA: Diagnosis not present

## 2018-05-17 DIAGNOSIS — D509 Iron deficiency anemia, unspecified: Secondary | ICD-10-CM | POA: Diagnosis not present

## 2018-05-17 DIAGNOSIS — N186 End stage renal disease: Secondary | ICD-10-CM | POA: Diagnosis not present

## 2018-05-17 DIAGNOSIS — N2581 Secondary hyperparathyroidism of renal origin: Secondary | ICD-10-CM | POA: Diagnosis not present

## 2018-05-19 DIAGNOSIS — N2581 Secondary hyperparathyroidism of renal origin: Secondary | ICD-10-CM | POA: Diagnosis not present

## 2018-05-19 DIAGNOSIS — Z79899 Other long term (current) drug therapy: Secondary | ICD-10-CM | POA: Diagnosis not present

## 2018-05-19 DIAGNOSIS — D631 Anemia in chronic kidney disease: Secondary | ICD-10-CM | POA: Diagnosis not present

## 2018-05-19 DIAGNOSIS — D509 Iron deficiency anemia, unspecified: Secondary | ICD-10-CM | POA: Diagnosis not present

## 2018-05-19 DIAGNOSIS — N186 End stage renal disease: Secondary | ICD-10-CM | POA: Diagnosis not present

## 2018-05-20 DIAGNOSIS — Z79899 Other long term (current) drug therapy: Secondary | ICD-10-CM | POA: Diagnosis not present

## 2018-05-20 DIAGNOSIS — N186 End stage renal disease: Secondary | ICD-10-CM | POA: Diagnosis not present

## 2018-05-20 DIAGNOSIS — D509 Iron deficiency anemia, unspecified: Secondary | ICD-10-CM | POA: Diagnosis not present

## 2018-05-20 DIAGNOSIS — D631 Anemia in chronic kidney disease: Secondary | ICD-10-CM | POA: Diagnosis not present

## 2018-05-20 DIAGNOSIS — N2581 Secondary hyperparathyroidism of renal origin: Secondary | ICD-10-CM | POA: Diagnosis not present

## 2018-05-21 DIAGNOSIS — N2581 Secondary hyperparathyroidism of renal origin: Secondary | ICD-10-CM | POA: Diagnosis not present

## 2018-05-21 DIAGNOSIS — Z79899 Other long term (current) drug therapy: Secondary | ICD-10-CM | POA: Diagnosis not present

## 2018-05-21 DIAGNOSIS — D631 Anemia in chronic kidney disease: Secondary | ICD-10-CM | POA: Diagnosis not present

## 2018-05-21 DIAGNOSIS — D509 Iron deficiency anemia, unspecified: Secondary | ICD-10-CM | POA: Diagnosis not present

## 2018-05-21 DIAGNOSIS — N186 End stage renal disease: Secondary | ICD-10-CM | POA: Diagnosis not present

## 2018-05-23 DIAGNOSIS — Z79899 Other long term (current) drug therapy: Secondary | ICD-10-CM | POA: Diagnosis not present

## 2018-05-23 DIAGNOSIS — D509 Iron deficiency anemia, unspecified: Secondary | ICD-10-CM | POA: Diagnosis not present

## 2018-05-23 DIAGNOSIS — D631 Anemia in chronic kidney disease: Secondary | ICD-10-CM | POA: Diagnosis not present

## 2018-05-23 DIAGNOSIS — N2581 Secondary hyperparathyroidism of renal origin: Secondary | ICD-10-CM | POA: Diagnosis not present

## 2018-05-23 DIAGNOSIS — N186 End stage renal disease: Secondary | ICD-10-CM | POA: Diagnosis not present

## 2018-05-24 DIAGNOSIS — R51 Headache: Secondary | ICD-10-CM | POA: Diagnosis not present

## 2018-05-24 DIAGNOSIS — S199XXA Unspecified injury of neck, initial encounter: Secondary | ICD-10-CM | POA: Diagnosis not present

## 2018-05-24 DIAGNOSIS — N185 Chronic kidney disease, stage 5: Secondary | ICD-10-CM | POA: Diagnosis not present

## 2018-05-24 DIAGNOSIS — E039 Hypothyroidism, unspecified: Secondary | ICD-10-CM | POA: Diagnosis not present

## 2018-05-24 DIAGNOSIS — Z992 Dependence on renal dialysis: Secondary | ICD-10-CM | POA: Diagnosis not present

## 2018-05-24 DIAGNOSIS — S8992XA Unspecified injury of left lower leg, initial encounter: Secondary | ICD-10-CM | POA: Diagnosis not present

## 2018-05-24 DIAGNOSIS — Z79899 Other long term (current) drug therapy: Secondary | ICD-10-CM | POA: Diagnosis not present

## 2018-05-24 DIAGNOSIS — E7800 Pure hypercholesterolemia, unspecified: Secondary | ICD-10-CM | POA: Diagnosis not present

## 2018-05-24 DIAGNOSIS — S00212A Abrasion of left eyelid and periocular area, initial encounter: Secondary | ICD-10-CM | POA: Diagnosis not present

## 2018-05-24 DIAGNOSIS — R220 Localized swelling, mass and lump, head: Secondary | ICD-10-CM | POA: Diagnosis not present

## 2018-05-24 DIAGNOSIS — H05232 Hemorrhage of left orbit: Secondary | ICD-10-CM | POA: Diagnosis not present

## 2018-05-24 DIAGNOSIS — Z7982 Long term (current) use of aspirin: Secondary | ICD-10-CM | POA: Diagnosis not present

## 2018-05-24 DIAGNOSIS — M25562 Pain in left knee: Secondary | ICD-10-CM | POA: Diagnosis not present

## 2018-05-24 DIAGNOSIS — S0181XA Laceration without foreign body of other part of head, initial encounter: Secondary | ICD-10-CM | POA: Diagnosis not present

## 2018-05-24 DIAGNOSIS — S01112A Laceration without foreign body of left eyelid and periocular area, initial encounter: Secondary | ICD-10-CM | POA: Diagnosis not present

## 2018-05-24 DIAGNOSIS — I120 Hypertensive chronic kidney disease with stage 5 chronic kidney disease or end stage renal disease: Secondary | ICD-10-CM | POA: Diagnosis not present

## 2018-05-24 DIAGNOSIS — S0990XA Unspecified injury of head, initial encounter: Secondary | ICD-10-CM | POA: Diagnosis not present

## 2018-05-24 DIAGNOSIS — S0191XA Laceration without foreign body of unspecified part of head, initial encounter: Secondary | ICD-10-CM | POA: Diagnosis not present

## 2018-05-25 DIAGNOSIS — N2581 Secondary hyperparathyroidism of renal origin: Secondary | ICD-10-CM | POA: Diagnosis not present

## 2018-05-25 DIAGNOSIS — D509 Iron deficiency anemia, unspecified: Secondary | ICD-10-CM | POA: Diagnosis not present

## 2018-05-25 DIAGNOSIS — D631 Anemia in chronic kidney disease: Secondary | ICD-10-CM | POA: Diagnosis not present

## 2018-05-25 DIAGNOSIS — S01112A Laceration without foreign body of left eyelid and periocular area, initial encounter: Secondary | ICD-10-CM | POA: Diagnosis not present

## 2018-05-25 DIAGNOSIS — Z79899 Other long term (current) drug therapy: Secondary | ICD-10-CM | POA: Diagnosis not present

## 2018-05-25 DIAGNOSIS — N186 End stage renal disease: Secondary | ICD-10-CM | POA: Diagnosis not present

## 2018-05-27 DIAGNOSIS — N186 End stage renal disease: Secondary | ICD-10-CM | POA: Diagnosis not present

## 2018-05-27 DIAGNOSIS — D631 Anemia in chronic kidney disease: Secondary | ICD-10-CM | POA: Diagnosis not present

## 2018-05-27 DIAGNOSIS — N2581 Secondary hyperparathyroidism of renal origin: Secondary | ICD-10-CM | POA: Diagnosis not present

## 2018-05-27 DIAGNOSIS — D509 Iron deficiency anemia, unspecified: Secondary | ICD-10-CM | POA: Diagnosis not present

## 2018-05-27 DIAGNOSIS — Z79899 Other long term (current) drug therapy: Secondary | ICD-10-CM | POA: Diagnosis not present

## 2018-05-28 DIAGNOSIS — N2581 Secondary hyperparathyroidism of renal origin: Secondary | ICD-10-CM | POA: Diagnosis not present

## 2018-05-28 DIAGNOSIS — D509 Iron deficiency anemia, unspecified: Secondary | ICD-10-CM | POA: Diagnosis not present

## 2018-05-28 DIAGNOSIS — Z79899 Other long term (current) drug therapy: Secondary | ICD-10-CM | POA: Diagnosis not present

## 2018-05-28 DIAGNOSIS — N186 End stage renal disease: Secondary | ICD-10-CM | POA: Diagnosis not present

## 2018-05-28 DIAGNOSIS — D631 Anemia in chronic kidney disease: Secondary | ICD-10-CM | POA: Diagnosis not present

## 2018-05-29 DIAGNOSIS — D631 Anemia in chronic kidney disease: Secondary | ICD-10-CM | POA: Diagnosis not present

## 2018-05-29 DIAGNOSIS — N2581 Secondary hyperparathyroidism of renal origin: Secondary | ICD-10-CM | POA: Diagnosis not present

## 2018-05-29 DIAGNOSIS — D509 Iron deficiency anemia, unspecified: Secondary | ICD-10-CM | POA: Diagnosis not present

## 2018-05-29 DIAGNOSIS — Z79899 Other long term (current) drug therapy: Secondary | ICD-10-CM | POA: Diagnosis not present

## 2018-05-29 DIAGNOSIS — N186 End stage renal disease: Secondary | ICD-10-CM | POA: Diagnosis not present

## 2018-05-31 DIAGNOSIS — D631 Anemia in chronic kidney disease: Secondary | ICD-10-CM | POA: Diagnosis not present

## 2018-05-31 DIAGNOSIS — N186 End stage renal disease: Secondary | ICD-10-CM | POA: Diagnosis not present

## 2018-05-31 DIAGNOSIS — Z79899 Other long term (current) drug therapy: Secondary | ICD-10-CM | POA: Diagnosis not present

## 2018-05-31 DIAGNOSIS — D509 Iron deficiency anemia, unspecified: Secondary | ICD-10-CM | POA: Diagnosis not present

## 2018-05-31 DIAGNOSIS — N2581 Secondary hyperparathyroidism of renal origin: Secondary | ICD-10-CM | POA: Diagnosis not present

## 2018-06-02 DIAGNOSIS — N2581 Secondary hyperparathyroidism of renal origin: Secondary | ICD-10-CM | POA: Diagnosis not present

## 2018-06-02 DIAGNOSIS — D509 Iron deficiency anemia, unspecified: Secondary | ICD-10-CM | POA: Diagnosis not present

## 2018-06-02 DIAGNOSIS — N186 End stage renal disease: Secondary | ICD-10-CM | POA: Diagnosis not present

## 2018-06-02 DIAGNOSIS — D631 Anemia in chronic kidney disease: Secondary | ICD-10-CM | POA: Diagnosis not present

## 2018-06-02 DIAGNOSIS — Z79899 Other long term (current) drug therapy: Secondary | ICD-10-CM | POA: Diagnosis not present

## 2018-06-04 DIAGNOSIS — Z79899 Other long term (current) drug therapy: Secondary | ICD-10-CM | POA: Diagnosis not present

## 2018-06-04 DIAGNOSIS — D509 Iron deficiency anemia, unspecified: Secondary | ICD-10-CM | POA: Diagnosis not present

## 2018-06-04 DIAGNOSIS — D631 Anemia in chronic kidney disease: Secondary | ICD-10-CM | POA: Diagnosis not present

## 2018-06-04 DIAGNOSIS — N186 End stage renal disease: Secondary | ICD-10-CM | POA: Diagnosis not present

## 2018-06-04 DIAGNOSIS — N2581 Secondary hyperparathyroidism of renal origin: Secondary | ICD-10-CM | POA: Diagnosis not present

## 2018-06-06 DIAGNOSIS — N2581 Secondary hyperparathyroidism of renal origin: Secondary | ICD-10-CM | POA: Diagnosis not present

## 2018-06-06 DIAGNOSIS — D631 Anemia in chronic kidney disease: Secondary | ICD-10-CM | POA: Diagnosis not present

## 2018-06-06 DIAGNOSIS — Z79899 Other long term (current) drug therapy: Secondary | ICD-10-CM | POA: Diagnosis not present

## 2018-06-06 DIAGNOSIS — N186 End stage renal disease: Secondary | ICD-10-CM | POA: Diagnosis not present

## 2018-06-06 DIAGNOSIS — D509 Iron deficiency anemia, unspecified: Secondary | ICD-10-CM | POA: Diagnosis not present

## 2018-06-08 DIAGNOSIS — Z79899 Other long term (current) drug therapy: Secondary | ICD-10-CM | POA: Diagnosis not present

## 2018-06-08 DIAGNOSIS — N2581 Secondary hyperparathyroidism of renal origin: Secondary | ICD-10-CM | POA: Diagnosis not present

## 2018-06-08 DIAGNOSIS — D509 Iron deficiency anemia, unspecified: Secondary | ICD-10-CM | POA: Diagnosis not present

## 2018-06-08 DIAGNOSIS — D631 Anemia in chronic kidney disease: Secondary | ICD-10-CM | POA: Diagnosis not present

## 2018-06-08 DIAGNOSIS — N186 End stage renal disease: Secondary | ICD-10-CM | POA: Diagnosis not present

## 2018-06-09 DIAGNOSIS — N186 End stage renal disease: Secondary | ICD-10-CM | POA: Diagnosis not present

## 2018-06-09 DIAGNOSIS — N059 Unspecified nephritic syndrome with unspecified morphologic changes: Secondary | ICD-10-CM | POA: Diagnosis not present

## 2018-06-09 DIAGNOSIS — Z992 Dependence on renal dialysis: Secondary | ICD-10-CM | POA: Diagnosis not present

## 2018-06-10 DIAGNOSIS — N186 End stage renal disease: Secondary | ICD-10-CM | POA: Diagnosis not present

## 2018-06-10 DIAGNOSIS — N2581 Secondary hyperparathyroidism of renal origin: Secondary | ICD-10-CM | POA: Diagnosis not present

## 2018-06-10 DIAGNOSIS — D631 Anemia in chronic kidney disease: Secondary | ICD-10-CM | POA: Diagnosis not present

## 2018-06-10 DIAGNOSIS — Z79899 Other long term (current) drug therapy: Secondary | ICD-10-CM | POA: Diagnosis not present

## 2018-06-12 DIAGNOSIS — N186 End stage renal disease: Secondary | ICD-10-CM | POA: Diagnosis not present

## 2018-06-12 DIAGNOSIS — Z79899 Other long term (current) drug therapy: Secondary | ICD-10-CM | POA: Diagnosis not present

## 2018-06-12 DIAGNOSIS — N2581 Secondary hyperparathyroidism of renal origin: Secondary | ICD-10-CM | POA: Diagnosis not present

## 2018-06-12 DIAGNOSIS — D631 Anemia in chronic kidney disease: Secondary | ICD-10-CM | POA: Diagnosis not present

## 2018-06-13 DIAGNOSIS — N2581 Secondary hyperparathyroidism of renal origin: Secondary | ICD-10-CM | POA: Diagnosis not present

## 2018-06-13 DIAGNOSIS — Z79899 Other long term (current) drug therapy: Secondary | ICD-10-CM | POA: Diagnosis not present

## 2018-06-13 DIAGNOSIS — D631 Anemia in chronic kidney disease: Secondary | ICD-10-CM | POA: Diagnosis not present

## 2018-06-13 DIAGNOSIS — N186 End stage renal disease: Secondary | ICD-10-CM | POA: Diagnosis not present

## 2018-06-14 DIAGNOSIS — D631 Anemia in chronic kidney disease: Secondary | ICD-10-CM | POA: Diagnosis not present

## 2018-06-14 DIAGNOSIS — N186 End stage renal disease: Secondary | ICD-10-CM | POA: Diagnosis not present

## 2018-06-14 DIAGNOSIS — N2581 Secondary hyperparathyroidism of renal origin: Secondary | ICD-10-CM | POA: Diagnosis not present

## 2018-06-14 DIAGNOSIS — Z79899 Other long term (current) drug therapy: Secondary | ICD-10-CM | POA: Diagnosis not present

## 2018-06-16 DIAGNOSIS — N2581 Secondary hyperparathyroidism of renal origin: Secondary | ICD-10-CM | POA: Diagnosis not present

## 2018-06-16 DIAGNOSIS — N186 End stage renal disease: Secondary | ICD-10-CM | POA: Diagnosis not present

## 2018-06-16 DIAGNOSIS — D631 Anemia in chronic kidney disease: Secondary | ICD-10-CM | POA: Diagnosis not present

## 2018-06-16 DIAGNOSIS — Z79899 Other long term (current) drug therapy: Secondary | ICD-10-CM | POA: Diagnosis not present

## 2018-06-18 DIAGNOSIS — N186 End stage renal disease: Secondary | ICD-10-CM | POA: Diagnosis not present

## 2018-06-18 DIAGNOSIS — Z79899 Other long term (current) drug therapy: Secondary | ICD-10-CM | POA: Diagnosis not present

## 2018-06-18 DIAGNOSIS — D631 Anemia in chronic kidney disease: Secondary | ICD-10-CM | POA: Diagnosis not present

## 2018-06-18 DIAGNOSIS — N2581 Secondary hyperparathyroidism of renal origin: Secondary | ICD-10-CM | POA: Diagnosis not present

## 2018-06-20 DIAGNOSIS — N2581 Secondary hyperparathyroidism of renal origin: Secondary | ICD-10-CM | POA: Diagnosis not present

## 2018-06-20 DIAGNOSIS — N186 End stage renal disease: Secondary | ICD-10-CM | POA: Diagnosis not present

## 2018-06-20 DIAGNOSIS — Z79899 Other long term (current) drug therapy: Secondary | ICD-10-CM | POA: Diagnosis not present

## 2018-06-20 DIAGNOSIS — D631 Anemia in chronic kidney disease: Secondary | ICD-10-CM | POA: Diagnosis not present

## 2018-06-22 DIAGNOSIS — N2581 Secondary hyperparathyroidism of renal origin: Secondary | ICD-10-CM | POA: Diagnosis not present

## 2018-06-22 DIAGNOSIS — N186 End stage renal disease: Secondary | ICD-10-CM | POA: Diagnosis not present

## 2018-06-22 DIAGNOSIS — D631 Anemia in chronic kidney disease: Secondary | ICD-10-CM | POA: Diagnosis not present

## 2018-06-22 DIAGNOSIS — Z79899 Other long term (current) drug therapy: Secondary | ICD-10-CM | POA: Diagnosis not present

## 2018-06-24 DIAGNOSIS — N186 End stage renal disease: Secondary | ICD-10-CM | POA: Diagnosis not present

## 2018-06-24 DIAGNOSIS — N2581 Secondary hyperparathyroidism of renal origin: Secondary | ICD-10-CM | POA: Diagnosis not present

## 2018-06-24 DIAGNOSIS — Z79899 Other long term (current) drug therapy: Secondary | ICD-10-CM | POA: Diagnosis not present

## 2018-06-24 DIAGNOSIS — D631 Anemia in chronic kidney disease: Secondary | ICD-10-CM | POA: Diagnosis not present

## 2018-06-26 DIAGNOSIS — N186 End stage renal disease: Secondary | ICD-10-CM | POA: Diagnosis not present

## 2018-06-26 DIAGNOSIS — Z79899 Other long term (current) drug therapy: Secondary | ICD-10-CM | POA: Diagnosis not present

## 2018-06-26 DIAGNOSIS — N2581 Secondary hyperparathyroidism of renal origin: Secondary | ICD-10-CM | POA: Diagnosis not present

## 2018-06-26 DIAGNOSIS — D631 Anemia in chronic kidney disease: Secondary | ICD-10-CM | POA: Diagnosis not present

## 2018-06-28 DIAGNOSIS — N186 End stage renal disease: Secondary | ICD-10-CM | POA: Diagnosis not present

## 2018-06-28 DIAGNOSIS — Z79899 Other long term (current) drug therapy: Secondary | ICD-10-CM | POA: Diagnosis not present

## 2018-06-28 DIAGNOSIS — D631 Anemia in chronic kidney disease: Secondary | ICD-10-CM | POA: Diagnosis not present

## 2018-06-28 DIAGNOSIS — N2581 Secondary hyperparathyroidism of renal origin: Secondary | ICD-10-CM | POA: Diagnosis not present

## 2018-06-30 DIAGNOSIS — N186 End stage renal disease: Secondary | ICD-10-CM | POA: Diagnosis not present

## 2018-06-30 DIAGNOSIS — Z79899 Other long term (current) drug therapy: Secondary | ICD-10-CM | POA: Diagnosis not present

## 2018-06-30 DIAGNOSIS — N2581 Secondary hyperparathyroidism of renal origin: Secondary | ICD-10-CM | POA: Diagnosis not present

## 2018-06-30 DIAGNOSIS — D631 Anemia in chronic kidney disease: Secondary | ICD-10-CM | POA: Diagnosis not present

## 2018-07-02 DIAGNOSIS — N2581 Secondary hyperparathyroidism of renal origin: Secondary | ICD-10-CM | POA: Diagnosis not present

## 2018-07-02 DIAGNOSIS — Z79899 Other long term (current) drug therapy: Secondary | ICD-10-CM | POA: Diagnosis not present

## 2018-07-02 DIAGNOSIS — N186 End stage renal disease: Secondary | ICD-10-CM | POA: Diagnosis not present

## 2018-07-02 DIAGNOSIS — D631 Anemia in chronic kidney disease: Secondary | ICD-10-CM | POA: Diagnosis not present

## 2018-07-04 DIAGNOSIS — D631 Anemia in chronic kidney disease: Secondary | ICD-10-CM | POA: Diagnosis not present

## 2018-07-04 DIAGNOSIS — Z79899 Other long term (current) drug therapy: Secondary | ICD-10-CM | POA: Diagnosis not present

## 2018-07-04 DIAGNOSIS — N2581 Secondary hyperparathyroidism of renal origin: Secondary | ICD-10-CM | POA: Diagnosis not present

## 2018-07-04 DIAGNOSIS — N186 End stage renal disease: Secondary | ICD-10-CM | POA: Diagnosis not present

## 2018-07-06 DIAGNOSIS — N2581 Secondary hyperparathyroidism of renal origin: Secondary | ICD-10-CM | POA: Diagnosis not present

## 2018-07-06 DIAGNOSIS — Z79899 Other long term (current) drug therapy: Secondary | ICD-10-CM | POA: Diagnosis not present

## 2018-07-06 DIAGNOSIS — N186 End stage renal disease: Secondary | ICD-10-CM | POA: Diagnosis not present

## 2018-07-06 DIAGNOSIS — D631 Anemia in chronic kidney disease: Secondary | ICD-10-CM | POA: Diagnosis not present

## 2018-07-08 DIAGNOSIS — N186 End stage renal disease: Secondary | ICD-10-CM | POA: Diagnosis not present

## 2018-07-08 DIAGNOSIS — Z79899 Other long term (current) drug therapy: Secondary | ICD-10-CM | POA: Diagnosis not present

## 2018-07-08 DIAGNOSIS — D631 Anemia in chronic kidney disease: Secondary | ICD-10-CM | POA: Diagnosis not present

## 2018-07-08 DIAGNOSIS — N2581 Secondary hyperparathyroidism of renal origin: Secondary | ICD-10-CM | POA: Diagnosis not present

## 2018-07-10 DIAGNOSIS — K7689 Other specified diseases of liver: Secondary | ICD-10-CM | POA: Diagnosis not present

## 2018-07-10 DIAGNOSIS — N2581 Secondary hyperparathyroidism of renal origin: Secondary | ICD-10-CM | POA: Diagnosis not present

## 2018-07-10 DIAGNOSIS — Z79899 Other long term (current) drug therapy: Secondary | ICD-10-CM | POA: Diagnosis not present

## 2018-07-10 DIAGNOSIS — D631 Anemia in chronic kidney disease: Secondary | ICD-10-CM | POA: Diagnosis not present

## 2018-07-10 DIAGNOSIS — D509 Iron deficiency anemia, unspecified: Secondary | ICD-10-CM | POA: Diagnosis not present

## 2018-07-10 DIAGNOSIS — N186 End stage renal disease: Secondary | ICD-10-CM | POA: Diagnosis not present

## 2018-07-12 DIAGNOSIS — D631 Anemia in chronic kidney disease: Secondary | ICD-10-CM | POA: Diagnosis not present

## 2018-07-12 DIAGNOSIS — Z79899 Other long term (current) drug therapy: Secondary | ICD-10-CM | POA: Diagnosis not present

## 2018-07-12 DIAGNOSIS — N186 End stage renal disease: Secondary | ICD-10-CM | POA: Diagnosis not present

## 2018-07-12 DIAGNOSIS — K7689 Other specified diseases of liver: Secondary | ICD-10-CM | POA: Diagnosis not present

## 2018-07-12 DIAGNOSIS — D509 Iron deficiency anemia, unspecified: Secondary | ICD-10-CM | POA: Diagnosis not present

## 2018-07-14 DIAGNOSIS — D631 Anemia in chronic kidney disease: Secondary | ICD-10-CM | POA: Diagnosis not present

## 2018-07-14 DIAGNOSIS — K7689 Other specified diseases of liver: Secondary | ICD-10-CM | POA: Diagnosis not present

## 2018-07-14 DIAGNOSIS — N186 End stage renal disease: Secondary | ICD-10-CM | POA: Diagnosis not present

## 2018-07-14 DIAGNOSIS — Z79899 Other long term (current) drug therapy: Secondary | ICD-10-CM | POA: Diagnosis not present

## 2018-07-14 DIAGNOSIS — D509 Iron deficiency anemia, unspecified: Secondary | ICD-10-CM | POA: Diagnosis not present

## 2018-07-15 DIAGNOSIS — M25562 Pain in left knee: Secondary | ICD-10-CM | POA: Diagnosis not present

## 2018-07-15 DIAGNOSIS — Z79899 Other long term (current) drug therapy: Secondary | ICD-10-CM | POA: Diagnosis not present

## 2018-07-15 DIAGNOSIS — D631 Anemia in chronic kidney disease: Secondary | ICD-10-CM | POA: Diagnosis not present

## 2018-07-15 DIAGNOSIS — N186 End stage renal disease: Secondary | ICD-10-CM | POA: Diagnosis not present

## 2018-07-15 DIAGNOSIS — K7689 Other specified diseases of liver: Secondary | ICD-10-CM | POA: Diagnosis not present

## 2018-07-15 DIAGNOSIS — M1712 Unilateral primary osteoarthritis, left knee: Secondary | ICD-10-CM | POA: Diagnosis not present

## 2018-07-15 DIAGNOSIS — R5381 Other malaise: Secondary | ICD-10-CM | POA: Diagnosis not present

## 2018-07-15 DIAGNOSIS — D509 Iron deficiency anemia, unspecified: Secondary | ICD-10-CM | POA: Diagnosis not present

## 2018-07-15 DIAGNOSIS — E7849 Other hyperlipidemia: Secondary | ICD-10-CM | POA: Diagnosis not present

## 2018-07-16 DIAGNOSIS — Z79899 Other long term (current) drug therapy: Secondary | ICD-10-CM | POA: Diagnosis not present

## 2018-07-16 DIAGNOSIS — D631 Anemia in chronic kidney disease: Secondary | ICD-10-CM | POA: Diagnosis not present

## 2018-07-16 DIAGNOSIS — N186 End stage renal disease: Secondary | ICD-10-CM | POA: Diagnosis not present

## 2018-07-16 DIAGNOSIS — K7689 Other specified diseases of liver: Secondary | ICD-10-CM | POA: Diagnosis not present

## 2018-07-16 DIAGNOSIS — D509 Iron deficiency anemia, unspecified: Secondary | ICD-10-CM | POA: Diagnosis not present

## 2018-07-18 DIAGNOSIS — N186 End stage renal disease: Secondary | ICD-10-CM | POA: Diagnosis not present

## 2018-07-18 DIAGNOSIS — D631 Anemia in chronic kidney disease: Secondary | ICD-10-CM | POA: Diagnosis not present

## 2018-07-18 DIAGNOSIS — D509 Iron deficiency anemia, unspecified: Secondary | ICD-10-CM | POA: Diagnosis not present

## 2018-07-18 DIAGNOSIS — Z79899 Other long term (current) drug therapy: Secondary | ICD-10-CM | POA: Diagnosis not present

## 2018-07-18 DIAGNOSIS — K7689 Other specified diseases of liver: Secondary | ICD-10-CM | POA: Diagnosis not present

## 2018-07-20 DIAGNOSIS — N186 End stage renal disease: Secondary | ICD-10-CM | POA: Diagnosis not present

## 2018-07-20 DIAGNOSIS — K7689 Other specified diseases of liver: Secondary | ICD-10-CM | POA: Diagnosis not present

## 2018-07-20 DIAGNOSIS — D509 Iron deficiency anemia, unspecified: Secondary | ICD-10-CM | POA: Diagnosis not present

## 2018-07-20 DIAGNOSIS — D631 Anemia in chronic kidney disease: Secondary | ICD-10-CM | POA: Diagnosis not present

## 2018-07-20 DIAGNOSIS — Z79899 Other long term (current) drug therapy: Secondary | ICD-10-CM | POA: Diagnosis not present

## 2018-07-22 DIAGNOSIS — N186 End stage renal disease: Secondary | ICD-10-CM | POA: Diagnosis not present

## 2018-07-22 DIAGNOSIS — D631 Anemia in chronic kidney disease: Secondary | ICD-10-CM | POA: Diagnosis not present

## 2018-07-22 DIAGNOSIS — Z79899 Other long term (current) drug therapy: Secondary | ICD-10-CM | POA: Diagnosis not present

## 2018-07-22 DIAGNOSIS — K7689 Other specified diseases of liver: Secondary | ICD-10-CM | POA: Diagnosis not present

## 2018-07-22 DIAGNOSIS — D509 Iron deficiency anemia, unspecified: Secondary | ICD-10-CM | POA: Diagnosis not present

## 2018-07-24 DIAGNOSIS — D509 Iron deficiency anemia, unspecified: Secondary | ICD-10-CM | POA: Diagnosis not present

## 2018-07-24 DIAGNOSIS — Z79899 Other long term (current) drug therapy: Secondary | ICD-10-CM | POA: Diagnosis not present

## 2018-07-24 DIAGNOSIS — N186 End stage renal disease: Secondary | ICD-10-CM | POA: Diagnosis not present

## 2018-07-24 DIAGNOSIS — K7689 Other specified diseases of liver: Secondary | ICD-10-CM | POA: Diagnosis not present

## 2018-07-24 DIAGNOSIS — D631 Anemia in chronic kidney disease: Secondary | ICD-10-CM | POA: Diagnosis not present

## 2018-07-26 DIAGNOSIS — N186 End stage renal disease: Secondary | ICD-10-CM | POA: Diagnosis not present

## 2018-07-26 DIAGNOSIS — D631 Anemia in chronic kidney disease: Secondary | ICD-10-CM | POA: Diagnosis not present

## 2018-07-26 DIAGNOSIS — Z79899 Other long term (current) drug therapy: Secondary | ICD-10-CM | POA: Diagnosis not present

## 2018-07-26 DIAGNOSIS — K7689 Other specified diseases of liver: Secondary | ICD-10-CM | POA: Diagnosis not present

## 2018-07-26 DIAGNOSIS — D509 Iron deficiency anemia, unspecified: Secondary | ICD-10-CM | POA: Diagnosis not present

## 2018-07-28 DIAGNOSIS — D509 Iron deficiency anemia, unspecified: Secondary | ICD-10-CM | POA: Diagnosis not present

## 2018-07-28 DIAGNOSIS — Z79899 Other long term (current) drug therapy: Secondary | ICD-10-CM | POA: Diagnosis not present

## 2018-07-28 DIAGNOSIS — D631 Anemia in chronic kidney disease: Secondary | ICD-10-CM | POA: Diagnosis not present

## 2018-07-28 DIAGNOSIS — N186 End stage renal disease: Secondary | ICD-10-CM | POA: Diagnosis not present

## 2018-07-28 DIAGNOSIS — K7689 Other specified diseases of liver: Secondary | ICD-10-CM | POA: Diagnosis not present

## 2018-07-29 DIAGNOSIS — K7689 Other specified diseases of liver: Secondary | ICD-10-CM | POA: Diagnosis not present

## 2018-07-29 DIAGNOSIS — D509 Iron deficiency anemia, unspecified: Secondary | ICD-10-CM | POA: Diagnosis not present

## 2018-07-29 DIAGNOSIS — D631 Anemia in chronic kidney disease: Secondary | ICD-10-CM | POA: Diagnosis not present

## 2018-07-29 DIAGNOSIS — N186 End stage renal disease: Secondary | ICD-10-CM | POA: Diagnosis not present

## 2018-07-29 DIAGNOSIS — Z79899 Other long term (current) drug therapy: Secondary | ICD-10-CM | POA: Diagnosis not present

## 2018-07-30 DIAGNOSIS — N186 End stage renal disease: Secondary | ICD-10-CM | POA: Diagnosis not present

## 2018-07-30 DIAGNOSIS — D631 Anemia in chronic kidney disease: Secondary | ICD-10-CM | POA: Diagnosis not present

## 2018-07-30 DIAGNOSIS — D509 Iron deficiency anemia, unspecified: Secondary | ICD-10-CM | POA: Diagnosis not present

## 2018-07-30 DIAGNOSIS — Z79899 Other long term (current) drug therapy: Secondary | ICD-10-CM | POA: Diagnosis not present

## 2018-07-30 DIAGNOSIS — K7689 Other specified diseases of liver: Secondary | ICD-10-CM | POA: Diagnosis not present

## 2018-08-01 ENCOUNTER — Ambulatory Visit: Payer: Medicare Other | Admitting: Sports Medicine

## 2018-08-01 DIAGNOSIS — N186 End stage renal disease: Secondary | ICD-10-CM | POA: Diagnosis not present

## 2018-08-01 DIAGNOSIS — K7689 Other specified diseases of liver: Secondary | ICD-10-CM | POA: Diagnosis not present

## 2018-08-01 DIAGNOSIS — D631 Anemia in chronic kidney disease: Secondary | ICD-10-CM | POA: Diagnosis not present

## 2018-08-01 DIAGNOSIS — Z79899 Other long term (current) drug therapy: Secondary | ICD-10-CM | POA: Diagnosis not present

## 2018-08-01 DIAGNOSIS — D509 Iron deficiency anemia, unspecified: Secondary | ICD-10-CM | POA: Diagnosis not present

## 2018-08-03 DIAGNOSIS — D631 Anemia in chronic kidney disease: Secondary | ICD-10-CM | POA: Diagnosis not present

## 2018-08-03 DIAGNOSIS — D509 Iron deficiency anemia, unspecified: Secondary | ICD-10-CM | POA: Diagnosis not present

## 2018-08-03 DIAGNOSIS — N186 End stage renal disease: Secondary | ICD-10-CM | POA: Diagnosis not present

## 2018-08-03 DIAGNOSIS — Z79899 Other long term (current) drug therapy: Secondary | ICD-10-CM | POA: Diagnosis not present

## 2018-08-03 DIAGNOSIS — K7689 Other specified diseases of liver: Secondary | ICD-10-CM | POA: Diagnosis not present

## 2018-08-05 DIAGNOSIS — D631 Anemia in chronic kidney disease: Secondary | ICD-10-CM | POA: Diagnosis not present

## 2018-08-05 DIAGNOSIS — K7689 Other specified diseases of liver: Secondary | ICD-10-CM | POA: Diagnosis not present

## 2018-08-05 DIAGNOSIS — Z79899 Other long term (current) drug therapy: Secondary | ICD-10-CM | POA: Diagnosis not present

## 2018-08-05 DIAGNOSIS — N186 End stage renal disease: Secondary | ICD-10-CM | POA: Diagnosis not present

## 2018-08-05 DIAGNOSIS — D509 Iron deficiency anemia, unspecified: Secondary | ICD-10-CM | POA: Diagnosis not present

## 2018-08-06 DIAGNOSIS — D509 Iron deficiency anemia, unspecified: Secondary | ICD-10-CM | POA: Diagnosis not present

## 2018-08-06 DIAGNOSIS — Z79899 Other long term (current) drug therapy: Secondary | ICD-10-CM | POA: Diagnosis not present

## 2018-08-06 DIAGNOSIS — N186 End stage renal disease: Secondary | ICD-10-CM | POA: Diagnosis not present

## 2018-08-06 DIAGNOSIS — D631 Anemia in chronic kidney disease: Secondary | ICD-10-CM | POA: Diagnosis not present

## 2018-08-06 DIAGNOSIS — K7689 Other specified diseases of liver: Secondary | ICD-10-CM | POA: Diagnosis not present

## 2018-08-07 DIAGNOSIS — N186 End stage renal disease: Secondary | ICD-10-CM | POA: Diagnosis not present

## 2018-08-07 DIAGNOSIS — Z79899 Other long term (current) drug therapy: Secondary | ICD-10-CM | POA: Diagnosis not present

## 2018-08-07 DIAGNOSIS — D509 Iron deficiency anemia, unspecified: Secondary | ICD-10-CM | POA: Diagnosis not present

## 2018-08-07 DIAGNOSIS — K7689 Other specified diseases of liver: Secondary | ICD-10-CM | POA: Diagnosis not present

## 2018-08-07 DIAGNOSIS — D631 Anemia in chronic kidney disease: Secondary | ICD-10-CM | POA: Diagnosis not present

## 2018-08-09 DIAGNOSIS — Z79899 Other long term (current) drug therapy: Secondary | ICD-10-CM | POA: Diagnosis not present

## 2018-08-09 DIAGNOSIS — K7689 Other specified diseases of liver: Secondary | ICD-10-CM | POA: Diagnosis not present

## 2018-08-09 DIAGNOSIS — N186 End stage renal disease: Secondary | ICD-10-CM | POA: Diagnosis not present

## 2018-08-09 DIAGNOSIS — N059 Unspecified nephritic syndrome with unspecified morphologic changes: Secondary | ICD-10-CM | POA: Diagnosis not present

## 2018-08-09 DIAGNOSIS — D509 Iron deficiency anemia, unspecified: Secondary | ICD-10-CM | POA: Diagnosis not present

## 2018-08-09 DIAGNOSIS — D631 Anemia in chronic kidney disease: Secondary | ICD-10-CM | POA: Diagnosis not present

## 2018-08-09 DIAGNOSIS — Z992 Dependence on renal dialysis: Secondary | ICD-10-CM | POA: Diagnosis not present

## 2018-08-11 DIAGNOSIS — D631 Anemia in chronic kidney disease: Secondary | ICD-10-CM | POA: Diagnosis not present

## 2018-08-11 DIAGNOSIS — D509 Iron deficiency anemia, unspecified: Secondary | ICD-10-CM | POA: Diagnosis not present

## 2018-08-11 DIAGNOSIS — Z79899 Other long term (current) drug therapy: Secondary | ICD-10-CM | POA: Diagnosis not present

## 2018-08-11 DIAGNOSIS — N2581 Secondary hyperparathyroidism of renal origin: Secondary | ICD-10-CM | POA: Diagnosis not present

## 2018-08-11 DIAGNOSIS — N186 End stage renal disease: Secondary | ICD-10-CM | POA: Diagnosis not present

## 2018-08-13 DIAGNOSIS — N186 End stage renal disease: Secondary | ICD-10-CM | POA: Diagnosis not present

## 2018-08-13 DIAGNOSIS — D509 Iron deficiency anemia, unspecified: Secondary | ICD-10-CM | POA: Diagnosis not present

## 2018-08-13 DIAGNOSIS — Z79899 Other long term (current) drug therapy: Secondary | ICD-10-CM | POA: Diagnosis not present

## 2018-08-13 DIAGNOSIS — D631 Anemia in chronic kidney disease: Secondary | ICD-10-CM | POA: Diagnosis not present

## 2018-08-13 DIAGNOSIS — N2581 Secondary hyperparathyroidism of renal origin: Secondary | ICD-10-CM | POA: Diagnosis not present

## 2018-08-14 ENCOUNTER — Ambulatory Visit (INDEPENDENT_AMBULATORY_CARE_PROVIDER_SITE_OTHER): Payer: Medicare Other | Admitting: Sports Medicine

## 2018-08-14 ENCOUNTER — Encounter: Payer: Self-pay | Admitting: Sports Medicine

## 2018-08-14 VITALS — BP 176/77 | HR 60 | Resp 16

## 2018-08-14 DIAGNOSIS — M79674 Pain in right toe(s): Secondary | ICD-10-CM | POA: Diagnosis not present

## 2018-08-14 DIAGNOSIS — B351 Tinea unguium: Secondary | ICD-10-CM | POA: Diagnosis not present

## 2018-08-14 DIAGNOSIS — M79675 Pain in left toe(s): Secondary | ICD-10-CM | POA: Diagnosis not present

## 2018-08-14 DIAGNOSIS — N186 End stage renal disease: Secondary | ICD-10-CM

## 2018-08-14 DIAGNOSIS — Z992 Dependence on renal dialysis: Secondary | ICD-10-CM

## 2018-08-14 NOTE — Progress Notes (Signed)
Subjective: Linda Jordan is a 85 y.o. female patient with history of diabetes who presents to office today complaining of long, painful nails  while ambulating in shoes; unable to trim. Patient IS NOT DIABETIC ANYMORE.  Patient reports that she is still on dialysis and reports that she is recovering from a fall that she had on November 15 had several stitches to her face and also injured her knee and is going to the orthopedic doctor on next week for possible knee injections.  Denies any other acute problems or issues at this time.  Patient Active Problem List   Diagnosis Date Noted  . Shortness of breath 05/01/2018  . Chronic systolic congestive heart failure ([place]) 01/12/2018  . CAD in native artery 01/11/2018  . Abnormal echocardiogram 01/07/2018  . Nonrheumatic aortic valve stenosis 01/07/2018  . Left leg swelling 10/13/2015  . Mass of abdomen 10/13/2015  . PD catheter dysfunction ([place]) 10/13/2015  . Cough 04/14/2015  . ESRD (end stage renal disease) on dialysis ([place]) 10/17/2014  . Bacterial peritonitis ([place]) 07/05/2013  . Thyroid disease 05/28/2013  . Hypertension 05/28/2013  . Arthritis 02/16/2011  . Bilateral swelling of feet 02/16/2011  . Mycotic toenails 02/16/2011  . Nephrotic syndrome with membranous glomerulonephritis 07/02/2010   Current Outpatient Medications on File Prior to Visit  Medication Sig Dispense Refill  . Acetaminophen (TYLENOL EX ST ARTHRITIS PAIN PO) Take by mouth.      Marland Kitchen amLODipine (NORVASC) 10 MG tablet Take 10 mg by mouth daily.      Marland Kitchen aspirin EC 81 MG tablet Take 81 mg by mouth.    Marland Kitchen atorvastatin (LIPITOR) 80 MG tablet Take 80 mg by mouth.    Marland Kitchen b complex vitamins capsule Take by mouth.    Marland Kitchen buPROPion (WELLBUTRIN SR) 100 MG 12 hr tablet TAKE 1 TABLET (100 MG TOTAL) BY MOUTH DAILY.  4  . carvedilol (COREG) 3.125 MG tablet Take 3.125 mg by mouth.    . cetirizine (ZYRTEC) 10 MG tablet 1 (ONE) TABLET BY MOUTH AT BEDTIME  12  . hydrochlorothiazide  (HYDRODIURIL) 25 MG tablet Take 25 mg by mouth daily.    Marland Kitchen letrozole (FEMARA) 2.5 MG tablet 1 TABLET ONCE DAILY - TAKE DAILY FOR HORMONAL PROTECTION AGAINST FUTURE BREAST CANCER  3  . levothyroxine (SYNTHROID) 25 MCG tablet Take by mouth.    . losartan (COZAAR) 50 MG tablet TAKE 1 TABLET BY MOUTH AT BEDTIME (STOP LISINOPRIL)  11  . metoprolol (LOPRESSOR) 50 MG tablet Take 50 mg by mouth 2 (two) times daily.    . Multiple Vitamin (MULTIVITAMIN) tablet Take 1 tablet by mouth daily.    . Nebivolol HCl (BYSTOLIC) 20 MG TABS Take by mouth.      . Omega-3 Fatty Acids (FISH OIL CONCENTRATE) 300 MG CAPS Take by mouth.    Marland Kitchen omeprazole (PRILOSEC) 40 MG capsule Take 40 mg by mouth daily.      Marland Kitchen oxycodone (OXY-IR) 5 MG capsule TAKE ONE TO TWO CAPSULES BY MOUTH EVERY 4 HOURS AS NEEDED FOR PAIN  0  . pregabalin (LYRICA) 75 MG capsule Take 75 mg by mouth 2 (two) times daily.      . rosuvastatin (CRESTOR) 10 MG tablet Take 10 mg by mouth daily.      Marland Kitchen spironolactone (ALDACTONE) 100 MG tablet Take 100 mg by mouth.    . warfarin (COUMADIN) 2 MG tablet Take 2 mg by mouth daily.       No current facility-administered medications on file  prior to visit.    Allergies  Allergen Reactions  . Codeine Nausea And Vomiting  . Other   . Sulfa Antibiotics Nausea And Vomiting  . Tape     No results found for this or any previous visit (from the past 2160 hour(s)).  Objective: General: Patient is awake, alert, and oriented x 3 and in no acute distress.  Integument: Skin is warm, dry and supple bilateral. Nails are tender, long, thickened and dystrophic with subungual debris, consistent with onychomycosis, 1-5 bilateral. No signs of infection. No open lesions or preulcerative lesions present bilateral. Remaining integument unremarkable.  Vasculature:  Dorsalis Pedis pulse 1/4 bilateral. Posterior Tibial pulse  1/4 bilateral.  Capillary fill time <3 sec 1-5 bilateral. Scant hair growth to the level of the  digits. Temperature gradient within normal limits. Trace varicosities present bilateral. No edema present bilateral.   Neurology: The patient has absent sensation measured with a 5.07/10g Semmes Weinstein Monofilament at all pedal sites bilateral . Vibratory sensation absent bilateral with tuning fork. No Babinski sign present bilateral.   Musculoskeletal: Asymptomatic hammertoes pedal deformities noted bilateral. Muscular strength 5/5 in all lower extremity muscular groups bilateral without pain on range of motion . No tenderness with calf compression bilateral.  Assessment and Plan: Problem List Items Addressed This Visit    None    Visit Diagnoses    Pain due to onychomycosis of toenails of both feet    -  Primary   ESRD on hemodialysis ([place])         -Examined patient. -Mechanically debrided all nails 1-5 bilateral using sterile nail nipper and filed with dremel without incident -Patient to return  in 3 months for at risk foot care -Patient advised to call the office if any problems or questions arise in the meantime.  Landis Martins, DPM

## 2018-08-15 DIAGNOSIS — I5022 Chronic systolic (congestive) heart failure: Secondary | ICD-10-CM | POA: Diagnosis not present

## 2018-08-15 DIAGNOSIS — D509 Iron deficiency anemia, unspecified: Secondary | ICD-10-CM | POA: Diagnosis not present

## 2018-08-15 DIAGNOSIS — N186 End stage renal disease: Secondary | ICD-10-CM | POA: Diagnosis not present

## 2018-08-15 DIAGNOSIS — D631 Anemia in chronic kidney disease: Secondary | ICD-10-CM | POA: Diagnosis not present

## 2018-08-15 DIAGNOSIS — N2581 Secondary hyperparathyroidism of renal origin: Secondary | ICD-10-CM | POA: Diagnosis not present

## 2018-08-15 DIAGNOSIS — I2510 Atherosclerotic heart disease of native coronary artery without angina pectoris: Secondary | ICD-10-CM | POA: Diagnosis not present

## 2018-08-15 DIAGNOSIS — Z79899 Other long term (current) drug therapy: Secondary | ICD-10-CM | POA: Diagnosis not present

## 2018-08-17 DIAGNOSIS — D509 Iron deficiency anemia, unspecified: Secondary | ICD-10-CM | POA: Diagnosis not present

## 2018-08-17 DIAGNOSIS — Z79899 Other long term (current) drug therapy: Secondary | ICD-10-CM | POA: Diagnosis not present

## 2018-08-17 DIAGNOSIS — D631 Anemia in chronic kidney disease: Secondary | ICD-10-CM | POA: Diagnosis not present

## 2018-08-17 DIAGNOSIS — N2581 Secondary hyperparathyroidism of renal origin: Secondary | ICD-10-CM | POA: Diagnosis not present

## 2018-08-17 DIAGNOSIS — N186 End stage renal disease: Secondary | ICD-10-CM | POA: Diagnosis not present

## 2018-08-19 DIAGNOSIS — D509 Iron deficiency anemia, unspecified: Secondary | ICD-10-CM | POA: Diagnosis not present

## 2018-08-19 DIAGNOSIS — Z79899 Other long term (current) drug therapy: Secondary | ICD-10-CM | POA: Diagnosis not present

## 2018-08-19 DIAGNOSIS — D631 Anemia in chronic kidney disease: Secondary | ICD-10-CM | POA: Diagnosis not present

## 2018-08-19 DIAGNOSIS — N186 End stage renal disease: Secondary | ICD-10-CM | POA: Diagnosis not present

## 2018-08-19 DIAGNOSIS — M1712 Unilateral primary osteoarthritis, left knee: Secondary | ICD-10-CM | POA: Diagnosis not present

## 2018-08-19 DIAGNOSIS — N2581 Secondary hyperparathyroidism of renal origin: Secondary | ICD-10-CM | POA: Diagnosis not present

## 2018-08-21 DIAGNOSIS — Z79899 Other long term (current) drug therapy: Secondary | ICD-10-CM | POA: Diagnosis not present

## 2018-08-21 DIAGNOSIS — D631 Anemia in chronic kidney disease: Secondary | ICD-10-CM | POA: Diagnosis not present

## 2018-08-21 DIAGNOSIS — N186 End stage renal disease: Secondary | ICD-10-CM | POA: Diagnosis not present

## 2018-08-21 DIAGNOSIS — N2581 Secondary hyperparathyroidism of renal origin: Secondary | ICD-10-CM | POA: Diagnosis not present

## 2018-08-21 DIAGNOSIS — D509 Iron deficiency anemia, unspecified: Secondary | ICD-10-CM | POA: Diagnosis not present

## 2018-08-23 DIAGNOSIS — Z79899 Other long term (current) drug therapy: Secondary | ICD-10-CM | POA: Diagnosis not present

## 2018-08-23 DIAGNOSIS — N2581 Secondary hyperparathyroidism of renal origin: Secondary | ICD-10-CM | POA: Diagnosis not present

## 2018-08-23 DIAGNOSIS — D509 Iron deficiency anemia, unspecified: Secondary | ICD-10-CM | POA: Diagnosis not present

## 2018-08-23 DIAGNOSIS — D631 Anemia in chronic kidney disease: Secondary | ICD-10-CM | POA: Diagnosis not present

## 2018-08-23 DIAGNOSIS — N186 End stage renal disease: Secondary | ICD-10-CM | POA: Diagnosis not present

## 2018-08-25 DIAGNOSIS — Z79899 Other long term (current) drug therapy: Secondary | ICD-10-CM | POA: Diagnosis not present

## 2018-08-25 DIAGNOSIS — D509 Iron deficiency anemia, unspecified: Secondary | ICD-10-CM | POA: Diagnosis not present

## 2018-08-25 DIAGNOSIS — N186 End stage renal disease: Secondary | ICD-10-CM | POA: Diagnosis not present

## 2018-08-25 DIAGNOSIS — N2581 Secondary hyperparathyroidism of renal origin: Secondary | ICD-10-CM | POA: Diagnosis not present

## 2018-08-25 DIAGNOSIS — D631 Anemia in chronic kidney disease: Secondary | ICD-10-CM | POA: Diagnosis not present

## 2018-08-27 DIAGNOSIS — D631 Anemia in chronic kidney disease: Secondary | ICD-10-CM | POA: Diagnosis not present

## 2018-08-27 DIAGNOSIS — D509 Iron deficiency anemia, unspecified: Secondary | ICD-10-CM | POA: Diagnosis not present

## 2018-08-27 DIAGNOSIS — N186 End stage renal disease: Secondary | ICD-10-CM | POA: Diagnosis not present

## 2018-08-27 DIAGNOSIS — N2581 Secondary hyperparathyroidism of renal origin: Secondary | ICD-10-CM | POA: Diagnosis not present

## 2018-08-27 DIAGNOSIS — Z79899 Other long term (current) drug therapy: Secondary | ICD-10-CM | POA: Diagnosis not present

## 2018-08-29 DIAGNOSIS — Z79899 Other long term (current) drug therapy: Secondary | ICD-10-CM | POA: Diagnosis not present

## 2018-08-29 DIAGNOSIS — D509 Iron deficiency anemia, unspecified: Secondary | ICD-10-CM | POA: Diagnosis not present

## 2018-08-29 DIAGNOSIS — N186 End stage renal disease: Secondary | ICD-10-CM | POA: Diagnosis not present

## 2018-08-29 DIAGNOSIS — N2581 Secondary hyperparathyroidism of renal origin: Secondary | ICD-10-CM | POA: Diagnosis not present

## 2018-08-29 DIAGNOSIS — D631 Anemia in chronic kidney disease: Secondary | ICD-10-CM | POA: Diagnosis not present

## 2018-08-31 DIAGNOSIS — Z79899 Other long term (current) drug therapy: Secondary | ICD-10-CM | POA: Diagnosis not present

## 2018-08-31 DIAGNOSIS — N2581 Secondary hyperparathyroidism of renal origin: Secondary | ICD-10-CM | POA: Diagnosis not present

## 2018-08-31 DIAGNOSIS — N186 End stage renal disease: Secondary | ICD-10-CM | POA: Diagnosis not present

## 2018-08-31 DIAGNOSIS — D631 Anemia in chronic kidney disease: Secondary | ICD-10-CM | POA: Diagnosis not present

## 2018-08-31 DIAGNOSIS — D509 Iron deficiency anemia, unspecified: Secondary | ICD-10-CM | POA: Diagnosis not present

## 2018-09-02 DIAGNOSIS — Z79899 Other long term (current) drug therapy: Secondary | ICD-10-CM | POA: Diagnosis not present

## 2018-09-02 DIAGNOSIS — D631 Anemia in chronic kidney disease: Secondary | ICD-10-CM | POA: Diagnosis not present

## 2018-09-02 DIAGNOSIS — N2581 Secondary hyperparathyroidism of renal origin: Secondary | ICD-10-CM | POA: Diagnosis not present

## 2018-09-02 DIAGNOSIS — N186 End stage renal disease: Secondary | ICD-10-CM | POA: Diagnosis not present

## 2018-09-02 DIAGNOSIS — D509 Iron deficiency anemia, unspecified: Secondary | ICD-10-CM | POA: Diagnosis not present

## 2018-09-04 DIAGNOSIS — C50412 Malignant neoplasm of upper-outer quadrant of left female breast: Secondary | ICD-10-CM | POA: Diagnosis not present

## 2018-09-04 DIAGNOSIS — D631 Anemia in chronic kidney disease: Secondary | ICD-10-CM | POA: Diagnosis not present

## 2018-09-04 DIAGNOSIS — N2581 Secondary hyperparathyroidism of renal origin: Secondary | ICD-10-CM | POA: Diagnosis not present

## 2018-09-04 DIAGNOSIS — N186 End stage renal disease: Secondary | ICD-10-CM | POA: Diagnosis not present

## 2018-09-04 DIAGNOSIS — D509 Iron deficiency anemia, unspecified: Secondary | ICD-10-CM | POA: Diagnosis not present

## 2018-09-04 DIAGNOSIS — R922 Inconclusive mammogram: Secondary | ICD-10-CM | POA: Diagnosis not present

## 2018-09-04 DIAGNOSIS — Z79899 Other long term (current) drug therapy: Secondary | ICD-10-CM | POA: Diagnosis not present

## 2018-09-06 DIAGNOSIS — N186 End stage renal disease: Secondary | ICD-10-CM | POA: Diagnosis not present

## 2018-09-06 DIAGNOSIS — N2581 Secondary hyperparathyroidism of renal origin: Secondary | ICD-10-CM | POA: Diagnosis not present

## 2018-09-06 DIAGNOSIS — D509 Iron deficiency anemia, unspecified: Secondary | ICD-10-CM | POA: Diagnosis not present

## 2018-09-06 DIAGNOSIS — Z79899 Other long term (current) drug therapy: Secondary | ICD-10-CM | POA: Diagnosis not present

## 2018-09-06 DIAGNOSIS — D631 Anemia in chronic kidney disease: Secondary | ICD-10-CM | POA: Diagnosis not present

## 2018-09-07 DIAGNOSIS — Z992 Dependence on renal dialysis: Secondary | ICD-10-CM | POA: Diagnosis not present

## 2018-09-07 DIAGNOSIS — N186 End stage renal disease: Secondary | ICD-10-CM | POA: Diagnosis not present

## 2018-09-07 DIAGNOSIS — N059 Unspecified nephritic syndrome with unspecified morphologic changes: Secondary | ICD-10-CM | POA: Diagnosis not present

## 2018-09-08 DIAGNOSIS — N059 Unspecified nephritic syndrome with unspecified morphologic changes: Secondary | ICD-10-CM | POA: Diagnosis not present

## 2018-09-08 DIAGNOSIS — D631 Anemia in chronic kidney disease: Secondary | ICD-10-CM | POA: Diagnosis not present

## 2018-09-08 DIAGNOSIS — N186 End stage renal disease: Secondary | ICD-10-CM | POA: Diagnosis not present

## 2018-09-08 DIAGNOSIS — N2581 Secondary hyperparathyroidism of renal origin: Secondary | ICD-10-CM | POA: Diagnosis not present

## 2018-09-08 DIAGNOSIS — Z992 Dependence on renal dialysis: Secondary | ICD-10-CM | POA: Diagnosis not present

## 2018-09-08 DIAGNOSIS — Z79899 Other long term (current) drug therapy: Secondary | ICD-10-CM | POA: Diagnosis not present

## 2018-09-10 DIAGNOSIS — N186 End stage renal disease: Secondary | ICD-10-CM | POA: Diagnosis not present

## 2018-09-10 DIAGNOSIS — Z79899 Other long term (current) drug therapy: Secondary | ICD-10-CM | POA: Diagnosis not present

## 2018-09-10 DIAGNOSIS — D631 Anemia in chronic kidney disease: Secondary | ICD-10-CM | POA: Diagnosis not present

## 2018-09-10 DIAGNOSIS — N2581 Secondary hyperparathyroidism of renal origin: Secondary | ICD-10-CM | POA: Diagnosis not present

## 2018-09-12 DIAGNOSIS — D631 Anemia in chronic kidney disease: Secondary | ICD-10-CM | POA: Diagnosis not present

## 2018-09-12 DIAGNOSIS — N186 End stage renal disease: Secondary | ICD-10-CM | POA: Diagnosis not present

## 2018-09-12 DIAGNOSIS — N2581 Secondary hyperparathyroidism of renal origin: Secondary | ICD-10-CM | POA: Diagnosis not present

## 2018-09-12 DIAGNOSIS — Z79899 Other long term (current) drug therapy: Secondary | ICD-10-CM | POA: Diagnosis not present

## 2018-09-14 DIAGNOSIS — Z79899 Other long term (current) drug therapy: Secondary | ICD-10-CM | POA: Diagnosis not present

## 2018-09-14 DIAGNOSIS — D631 Anemia in chronic kidney disease: Secondary | ICD-10-CM | POA: Diagnosis not present

## 2018-09-14 DIAGNOSIS — N186 End stage renal disease: Secondary | ICD-10-CM | POA: Diagnosis not present

## 2018-09-14 DIAGNOSIS — N2581 Secondary hyperparathyroidism of renal origin: Secondary | ICD-10-CM | POA: Diagnosis not present

## 2018-09-16 DIAGNOSIS — N2581 Secondary hyperparathyroidism of renal origin: Secondary | ICD-10-CM | POA: Diagnosis not present

## 2018-09-16 DIAGNOSIS — N186 End stage renal disease: Secondary | ICD-10-CM | POA: Diagnosis not present

## 2018-09-16 DIAGNOSIS — D631 Anemia in chronic kidney disease: Secondary | ICD-10-CM | POA: Diagnosis not present

## 2018-09-16 DIAGNOSIS — Z79899 Other long term (current) drug therapy: Secondary | ICD-10-CM | POA: Diagnosis not present

## 2018-09-17 DIAGNOSIS — D631 Anemia in chronic kidney disease: Secondary | ICD-10-CM | POA: Diagnosis not present

## 2018-09-17 DIAGNOSIS — Z170 Estrogen receptor positive status [ER+]: Secondary | ICD-10-CM | POA: Diagnosis not present

## 2018-09-17 DIAGNOSIS — Z79811 Long term (current) use of aromatase inhibitors: Secondary | ICD-10-CM | POA: Diagnosis not present

## 2018-09-17 DIAGNOSIS — Z79899 Other long term (current) drug therapy: Secondary | ICD-10-CM | POA: Diagnosis not present

## 2018-09-17 DIAGNOSIS — N186 End stage renal disease: Secondary | ICD-10-CM | POA: Diagnosis not present

## 2018-09-17 DIAGNOSIS — Z853 Personal history of malignant neoplasm of breast: Secondary | ICD-10-CM | POA: Diagnosis not present

## 2018-09-17 DIAGNOSIS — N2581 Secondary hyperparathyroidism of renal origin: Secondary | ICD-10-CM | POA: Diagnosis not present

## 2018-09-17 DIAGNOSIS — C50919 Malignant neoplasm of unspecified site of unspecified female breast: Secondary | ICD-10-CM | POA: Diagnosis not present

## 2018-09-18 DIAGNOSIS — D631 Anemia in chronic kidney disease: Secondary | ICD-10-CM | POA: Diagnosis not present

## 2018-09-18 DIAGNOSIS — Z79899 Other long term (current) drug therapy: Secondary | ICD-10-CM | POA: Diagnosis not present

## 2018-09-18 DIAGNOSIS — N186 End stage renal disease: Secondary | ICD-10-CM | POA: Diagnosis not present

## 2018-09-18 DIAGNOSIS — N2581 Secondary hyperparathyroidism of renal origin: Secondary | ICD-10-CM | POA: Diagnosis not present

## 2018-09-20 DIAGNOSIS — D631 Anemia in chronic kidney disease: Secondary | ICD-10-CM | POA: Diagnosis not present

## 2018-09-20 DIAGNOSIS — Z79899 Other long term (current) drug therapy: Secondary | ICD-10-CM | POA: Diagnosis not present

## 2018-09-20 DIAGNOSIS — N2581 Secondary hyperparathyroidism of renal origin: Secondary | ICD-10-CM | POA: Diagnosis not present

## 2018-09-20 DIAGNOSIS — N186 End stage renal disease: Secondary | ICD-10-CM | POA: Diagnosis not present

## 2018-09-22 DIAGNOSIS — N2581 Secondary hyperparathyroidism of renal origin: Secondary | ICD-10-CM | POA: Diagnosis not present

## 2018-09-22 DIAGNOSIS — N186 End stage renal disease: Secondary | ICD-10-CM | POA: Diagnosis not present

## 2018-09-22 DIAGNOSIS — Z79899 Other long term (current) drug therapy: Secondary | ICD-10-CM | POA: Diagnosis not present

## 2018-09-22 DIAGNOSIS — D631 Anemia in chronic kidney disease: Secondary | ICD-10-CM | POA: Diagnosis not present

## 2018-09-23 DIAGNOSIS — I82409 Acute embolism and thrombosis of unspecified deep veins of unspecified lower extremity: Secondary | ICD-10-CM | POA: Diagnosis not present

## 2018-09-23 DIAGNOSIS — J449 Chronic obstructive pulmonary disease, unspecified: Secondary | ICD-10-CM | POA: Diagnosis not present

## 2018-09-23 DIAGNOSIS — N186 End stage renal disease: Secondary | ICD-10-CM | POA: Diagnosis not present

## 2018-09-23 DIAGNOSIS — R6889 Other general symptoms and signs: Secondary | ICD-10-CM | POA: Diagnosis not present

## 2018-09-24 DIAGNOSIS — N2581 Secondary hyperparathyroidism of renal origin: Secondary | ICD-10-CM | POA: Diagnosis not present

## 2018-09-24 DIAGNOSIS — N186 End stage renal disease: Secondary | ICD-10-CM | POA: Diagnosis not present

## 2018-09-24 DIAGNOSIS — D631 Anemia in chronic kidney disease: Secondary | ICD-10-CM | POA: Diagnosis not present

## 2018-09-24 DIAGNOSIS — Z79899 Other long term (current) drug therapy: Secondary | ICD-10-CM | POA: Diagnosis not present

## 2018-09-25 DIAGNOSIS — G8929 Other chronic pain: Secondary | ICD-10-CM | POA: Diagnosis not present

## 2018-09-25 DIAGNOSIS — M25562 Pain in left knee: Secondary | ICD-10-CM | POA: Diagnosis not present

## 2018-09-25 DIAGNOSIS — M1712 Unilateral primary osteoarthritis, left knee: Secondary | ICD-10-CM | POA: Diagnosis not present

## 2018-09-26 DIAGNOSIS — N186 End stage renal disease: Secondary | ICD-10-CM | POA: Diagnosis not present

## 2018-09-26 DIAGNOSIS — D631 Anemia in chronic kidney disease: Secondary | ICD-10-CM | POA: Diagnosis not present

## 2018-09-26 DIAGNOSIS — Z79899 Other long term (current) drug therapy: Secondary | ICD-10-CM | POA: Diagnosis not present

## 2018-09-26 DIAGNOSIS — N2581 Secondary hyperparathyroidism of renal origin: Secondary | ICD-10-CM | POA: Diagnosis not present

## 2018-09-27 DIAGNOSIS — I82409 Acute embolism and thrombosis of unspecified deep veins of unspecified lower extremity: Secondary | ICD-10-CM | POA: Diagnosis not present

## 2018-09-27 DIAGNOSIS — M545 Low back pain: Secondary | ICD-10-CM | POA: Diagnosis not present

## 2018-09-27 DIAGNOSIS — R6889 Other general symptoms and signs: Secondary | ICD-10-CM | POA: Diagnosis not present

## 2018-09-27 DIAGNOSIS — J449 Chronic obstructive pulmonary disease, unspecified: Secondary | ICD-10-CM | POA: Diagnosis not present

## 2018-09-28 DIAGNOSIS — Z79899 Other long term (current) drug therapy: Secondary | ICD-10-CM | POA: Diagnosis not present

## 2018-09-28 DIAGNOSIS — D631 Anemia in chronic kidney disease: Secondary | ICD-10-CM | POA: Diagnosis not present

## 2018-09-28 DIAGNOSIS — N186 End stage renal disease: Secondary | ICD-10-CM | POA: Diagnosis not present

## 2018-09-28 DIAGNOSIS — N2581 Secondary hyperparathyroidism of renal origin: Secondary | ICD-10-CM | POA: Diagnosis not present

## 2018-09-30 DIAGNOSIS — Z79899 Other long term (current) drug therapy: Secondary | ICD-10-CM | POA: Diagnosis not present

## 2018-09-30 DIAGNOSIS — D631 Anemia in chronic kidney disease: Secondary | ICD-10-CM | POA: Diagnosis not present

## 2018-09-30 DIAGNOSIS — N186 End stage renal disease: Secondary | ICD-10-CM | POA: Diagnosis not present

## 2018-09-30 DIAGNOSIS — N2581 Secondary hyperparathyroidism of renal origin: Secondary | ICD-10-CM | POA: Diagnosis not present

## 2018-10-02 DIAGNOSIS — Z79899 Other long term (current) drug therapy: Secondary | ICD-10-CM | POA: Diagnosis not present

## 2018-10-02 DIAGNOSIS — D631 Anemia in chronic kidney disease: Secondary | ICD-10-CM | POA: Diagnosis not present

## 2018-10-02 DIAGNOSIS — N2581 Secondary hyperparathyroidism of renal origin: Secondary | ICD-10-CM | POA: Diagnosis not present

## 2018-10-02 DIAGNOSIS — N186 End stage renal disease: Secondary | ICD-10-CM | POA: Diagnosis not present

## 2018-10-03 DIAGNOSIS — N186 End stage renal disease: Secondary | ICD-10-CM | POA: Diagnosis not present

## 2018-10-03 DIAGNOSIS — D631 Anemia in chronic kidney disease: Secondary | ICD-10-CM | POA: Diagnosis not present

## 2018-10-03 DIAGNOSIS — N2581 Secondary hyperparathyroidism of renal origin: Secondary | ICD-10-CM | POA: Diagnosis not present

## 2018-10-03 DIAGNOSIS — Z79899 Other long term (current) drug therapy: Secondary | ICD-10-CM | POA: Diagnosis not present

## 2018-10-05 DIAGNOSIS — N186 End stage renal disease: Secondary | ICD-10-CM | POA: Diagnosis not present

## 2018-10-05 DIAGNOSIS — N2581 Secondary hyperparathyroidism of renal origin: Secondary | ICD-10-CM | POA: Diagnosis not present

## 2018-10-05 DIAGNOSIS — D631 Anemia in chronic kidney disease: Secondary | ICD-10-CM | POA: Diagnosis not present

## 2018-10-05 DIAGNOSIS — Z79899 Other long term (current) drug therapy: Secondary | ICD-10-CM | POA: Diagnosis not present

## 2018-10-07 DIAGNOSIS — N2581 Secondary hyperparathyroidism of renal origin: Secondary | ICD-10-CM | POA: Diagnosis not present

## 2018-10-07 DIAGNOSIS — H9122 Sudden idiopathic hearing loss, left ear: Secondary | ICD-10-CM | POA: Diagnosis not present

## 2018-10-07 DIAGNOSIS — J449 Chronic obstructive pulmonary disease, unspecified: Secondary | ICD-10-CM | POA: Diagnosis not present

## 2018-10-07 DIAGNOSIS — N186 End stage renal disease: Secondary | ICD-10-CM | POA: Diagnosis not present

## 2018-10-07 DIAGNOSIS — I82409 Acute embolism and thrombosis of unspecified deep veins of unspecified lower extremity: Secondary | ICD-10-CM | POA: Diagnosis not present

## 2018-10-07 DIAGNOSIS — M545 Low back pain: Secondary | ICD-10-CM | POA: Diagnosis not present

## 2018-10-07 DIAGNOSIS — D631 Anemia in chronic kidney disease: Secondary | ICD-10-CM | POA: Diagnosis not present

## 2018-10-07 DIAGNOSIS — Z79899 Other long term (current) drug therapy: Secondary | ICD-10-CM | POA: Diagnosis not present

## 2018-10-08 DIAGNOSIS — N186 End stage renal disease: Secondary | ICD-10-CM | POA: Diagnosis not present

## 2018-10-08 DIAGNOSIS — Z79899 Other long term (current) drug therapy: Secondary | ICD-10-CM | POA: Diagnosis not present

## 2018-10-08 DIAGNOSIS — D631 Anemia in chronic kidney disease: Secondary | ICD-10-CM | POA: Diagnosis not present

## 2018-10-08 DIAGNOSIS — N2581 Secondary hyperparathyroidism of renal origin: Secondary | ICD-10-CM | POA: Diagnosis not present

## 2018-10-09 DIAGNOSIS — N059 Unspecified nephritic syndrome with unspecified morphologic changes: Secondary | ICD-10-CM | POA: Diagnosis not present

## 2018-10-09 DIAGNOSIS — H6593 Unspecified nonsuppurative otitis media, bilateral: Secondary | ICD-10-CM | POA: Diagnosis not present

## 2018-10-09 DIAGNOSIS — Z992 Dependence on renal dialysis: Secondary | ICD-10-CM | POA: Diagnosis not present

## 2018-10-09 DIAGNOSIS — J342 Deviated nasal septum: Secondary | ICD-10-CM | POA: Diagnosis not present

## 2018-10-09 DIAGNOSIS — H6121 Impacted cerumen, right ear: Secondary | ICD-10-CM | POA: Diagnosis not present

## 2018-10-09 DIAGNOSIS — H6983 Other specified disorders of Eustachian tube, bilateral: Secondary | ICD-10-CM | POA: Diagnosis not present

## 2018-10-09 DIAGNOSIS — H9192 Unspecified hearing loss, left ear: Secondary | ICD-10-CM | POA: Diagnosis not present

## 2018-10-09 DIAGNOSIS — N186 End stage renal disease: Secondary | ICD-10-CM | POA: Diagnosis not present

## 2018-10-10 DIAGNOSIS — Z79899 Other long term (current) drug therapy: Secondary | ICD-10-CM | POA: Diagnosis not present

## 2018-10-10 DIAGNOSIS — N2581 Secondary hyperparathyroidism of renal origin: Secondary | ICD-10-CM | POA: Diagnosis not present

## 2018-10-10 DIAGNOSIS — N186 End stage renal disease: Secondary | ICD-10-CM | POA: Diagnosis not present

## 2018-10-10 DIAGNOSIS — D631 Anemia in chronic kidney disease: Secondary | ICD-10-CM | POA: Diagnosis not present

## 2018-10-10 DIAGNOSIS — R5381 Other malaise: Secondary | ICD-10-CM | POA: Diagnosis not present

## 2018-10-10 DIAGNOSIS — E7849 Other hyperlipidemia: Secondary | ICD-10-CM | POA: Diagnosis not present

## 2018-10-12 DIAGNOSIS — Z79899 Other long term (current) drug therapy: Secondary | ICD-10-CM | POA: Diagnosis not present

## 2018-10-12 DIAGNOSIS — N2581 Secondary hyperparathyroidism of renal origin: Secondary | ICD-10-CM | POA: Diagnosis not present

## 2018-10-12 DIAGNOSIS — N186 End stage renal disease: Secondary | ICD-10-CM | POA: Diagnosis not present

## 2018-10-12 DIAGNOSIS — D631 Anemia in chronic kidney disease: Secondary | ICD-10-CM | POA: Diagnosis not present

## 2018-10-14 DIAGNOSIS — N2581 Secondary hyperparathyroidism of renal origin: Secondary | ICD-10-CM | POA: Diagnosis not present

## 2018-10-14 DIAGNOSIS — D631 Anemia in chronic kidney disease: Secondary | ICD-10-CM | POA: Diagnosis not present

## 2018-10-14 DIAGNOSIS — Z79899 Other long term (current) drug therapy: Secondary | ICD-10-CM | POA: Diagnosis not present

## 2018-10-14 DIAGNOSIS — N186 End stage renal disease: Secondary | ICD-10-CM | POA: Diagnosis not present

## 2018-10-15 DIAGNOSIS — I82409 Acute embolism and thrombosis of unspecified deep veins of unspecified lower extremity: Secondary | ICD-10-CM | POA: Diagnosis not present

## 2018-10-15 DIAGNOSIS — J449 Chronic obstructive pulmonary disease, unspecified: Secondary | ICD-10-CM | POA: Diagnosis not present

## 2018-10-15 DIAGNOSIS — Z6820 Body mass index (BMI) 20.0-20.9, adult: Secondary | ICD-10-CM | POA: Diagnosis not present

## 2018-10-15 DIAGNOSIS — H9122 Sudden idiopathic hearing loss, left ear: Secondary | ICD-10-CM | POA: Diagnosis not present

## 2018-10-15 DIAGNOSIS — M545 Low back pain: Secondary | ICD-10-CM | POA: Diagnosis not present

## 2018-10-16 DIAGNOSIS — Z79899 Other long term (current) drug therapy: Secondary | ICD-10-CM | POA: Diagnosis not present

## 2018-10-16 DIAGNOSIS — N2581 Secondary hyperparathyroidism of renal origin: Secondary | ICD-10-CM | POA: Diagnosis not present

## 2018-10-16 DIAGNOSIS — D631 Anemia in chronic kidney disease: Secondary | ICD-10-CM | POA: Diagnosis not present

## 2018-10-16 DIAGNOSIS — N186 End stage renal disease: Secondary | ICD-10-CM | POA: Diagnosis not present

## 2018-10-17 DIAGNOSIS — N186 End stage renal disease: Secondary | ICD-10-CM | POA: Diagnosis not present

## 2018-10-17 DIAGNOSIS — D631 Anemia in chronic kidney disease: Secondary | ICD-10-CM | POA: Diagnosis not present

## 2018-10-17 DIAGNOSIS — Z79899 Other long term (current) drug therapy: Secondary | ICD-10-CM | POA: Diagnosis not present

## 2018-10-17 DIAGNOSIS — N2581 Secondary hyperparathyroidism of renal origin: Secondary | ICD-10-CM | POA: Diagnosis not present

## 2018-10-18 DIAGNOSIS — N186 End stage renal disease: Secondary | ICD-10-CM | POA: Diagnosis not present

## 2018-10-18 DIAGNOSIS — N2581 Secondary hyperparathyroidism of renal origin: Secondary | ICD-10-CM | POA: Diagnosis not present

## 2018-10-18 DIAGNOSIS — Z79899 Other long term (current) drug therapy: Secondary | ICD-10-CM | POA: Diagnosis not present

## 2018-10-18 DIAGNOSIS — D631 Anemia in chronic kidney disease: Secondary | ICD-10-CM | POA: Diagnosis not present

## 2018-10-20 DIAGNOSIS — D631 Anemia in chronic kidney disease: Secondary | ICD-10-CM | POA: Diagnosis not present

## 2018-10-20 DIAGNOSIS — N186 End stage renal disease: Secondary | ICD-10-CM | POA: Diagnosis not present

## 2018-10-20 DIAGNOSIS — N2581 Secondary hyperparathyroidism of renal origin: Secondary | ICD-10-CM | POA: Diagnosis not present

## 2018-10-20 DIAGNOSIS — Z79899 Other long term (current) drug therapy: Secondary | ICD-10-CM | POA: Diagnosis not present

## 2018-10-21 DIAGNOSIS — H6983 Other specified disorders of Eustachian tube, bilateral: Secondary | ICD-10-CM | POA: Diagnosis not present

## 2018-10-21 DIAGNOSIS — M1712 Unilateral primary osteoarthritis, left knee: Secondary | ICD-10-CM | POA: Diagnosis not present

## 2018-10-21 DIAGNOSIS — J342 Deviated nasal septum: Secondary | ICD-10-CM | POA: Diagnosis not present

## 2018-10-21 DIAGNOSIS — H938X9 Other specified disorders of ear, unspecified ear: Secondary | ICD-10-CM | POA: Diagnosis not present

## 2018-10-22 DIAGNOSIS — N2581 Secondary hyperparathyroidism of renal origin: Secondary | ICD-10-CM | POA: Diagnosis not present

## 2018-10-22 DIAGNOSIS — D631 Anemia in chronic kidney disease: Secondary | ICD-10-CM | POA: Diagnosis not present

## 2018-10-22 DIAGNOSIS — N186 End stage renal disease: Secondary | ICD-10-CM | POA: Diagnosis not present

## 2018-10-22 DIAGNOSIS — Z79899 Other long term (current) drug therapy: Secondary | ICD-10-CM | POA: Diagnosis not present

## 2018-10-23 DIAGNOSIS — E785 Hyperlipidemia, unspecified: Secondary | ICD-10-CM | POA: Diagnosis not present

## 2018-10-23 DIAGNOSIS — M545 Low back pain: Secondary | ICD-10-CM | POA: Diagnosis not present

## 2018-10-23 DIAGNOSIS — I82409 Acute embolism and thrombosis of unspecified deep veins of unspecified lower extremity: Secondary | ICD-10-CM | POA: Diagnosis not present

## 2018-10-23 DIAGNOSIS — Z0000 Encounter for general adult medical examination without abnormal findings: Secondary | ICD-10-CM | POA: Diagnosis not present

## 2018-10-23 DIAGNOSIS — Z1331 Encounter for screening for depression: Secondary | ICD-10-CM | POA: Diagnosis not present

## 2018-10-23 DIAGNOSIS — Z9181 History of falling: Secondary | ICD-10-CM | POA: Diagnosis not present

## 2018-10-23 DIAGNOSIS — J449 Chronic obstructive pulmonary disease, unspecified: Secondary | ICD-10-CM | POA: Diagnosis not present

## 2018-10-23 DIAGNOSIS — F0390 Unspecified dementia without behavioral disturbance: Secondary | ICD-10-CM | POA: Diagnosis not present

## 2018-10-24 DIAGNOSIS — Z79899 Other long term (current) drug therapy: Secondary | ICD-10-CM | POA: Diagnosis not present

## 2018-10-24 DIAGNOSIS — N186 End stage renal disease: Secondary | ICD-10-CM | POA: Diagnosis not present

## 2018-10-24 DIAGNOSIS — D631 Anemia in chronic kidney disease: Secondary | ICD-10-CM | POA: Diagnosis not present

## 2018-10-24 DIAGNOSIS — N2581 Secondary hyperparathyroidism of renal origin: Secondary | ICD-10-CM | POA: Diagnosis not present

## 2018-10-26 DIAGNOSIS — N2581 Secondary hyperparathyroidism of renal origin: Secondary | ICD-10-CM | POA: Diagnosis not present

## 2018-10-26 DIAGNOSIS — N186 End stage renal disease: Secondary | ICD-10-CM | POA: Diagnosis not present

## 2018-10-26 DIAGNOSIS — Z79899 Other long term (current) drug therapy: Secondary | ICD-10-CM | POA: Diagnosis not present

## 2018-10-26 DIAGNOSIS — D631 Anemia in chronic kidney disease: Secondary | ICD-10-CM | POA: Diagnosis not present

## 2018-10-28 DIAGNOSIS — N2581 Secondary hyperparathyroidism of renal origin: Secondary | ICD-10-CM | POA: Diagnosis not present

## 2018-10-28 DIAGNOSIS — D631 Anemia in chronic kidney disease: Secondary | ICD-10-CM | POA: Diagnosis not present

## 2018-10-28 DIAGNOSIS — N186 End stage renal disease: Secondary | ICD-10-CM | POA: Diagnosis not present

## 2018-10-28 DIAGNOSIS — Z79899 Other long term (current) drug therapy: Secondary | ICD-10-CM | POA: Diagnosis not present

## 2018-10-30 DIAGNOSIS — M545 Low back pain: Secondary | ICD-10-CM | POA: Diagnosis not present

## 2018-10-30 DIAGNOSIS — D631 Anemia in chronic kidney disease: Secondary | ICD-10-CM | POA: Diagnosis not present

## 2018-10-30 DIAGNOSIS — F0390 Unspecified dementia without behavioral disturbance: Secondary | ICD-10-CM | POA: Diagnosis not present

## 2018-10-30 DIAGNOSIS — N2581 Secondary hyperparathyroidism of renal origin: Secondary | ICD-10-CM | POA: Diagnosis not present

## 2018-10-30 DIAGNOSIS — Z79899 Other long term (current) drug therapy: Secondary | ICD-10-CM | POA: Diagnosis not present

## 2018-10-30 DIAGNOSIS — I82409 Acute embolism and thrombosis of unspecified deep veins of unspecified lower extremity: Secondary | ICD-10-CM | POA: Diagnosis not present

## 2018-10-30 DIAGNOSIS — J449 Chronic obstructive pulmonary disease, unspecified: Secondary | ICD-10-CM | POA: Diagnosis not present

## 2018-10-30 DIAGNOSIS — N186 End stage renal disease: Secondary | ICD-10-CM | POA: Diagnosis not present

## 2018-11-01 DIAGNOSIS — Z79899 Other long term (current) drug therapy: Secondary | ICD-10-CM | POA: Diagnosis not present

## 2018-11-01 DIAGNOSIS — N186 End stage renal disease: Secondary | ICD-10-CM | POA: Diagnosis not present

## 2018-11-01 DIAGNOSIS — N2581 Secondary hyperparathyroidism of renal origin: Secondary | ICD-10-CM | POA: Diagnosis not present

## 2018-11-01 DIAGNOSIS — D631 Anemia in chronic kidney disease: Secondary | ICD-10-CM | POA: Diagnosis not present

## 2018-11-03 DIAGNOSIS — Z79899 Other long term (current) drug therapy: Secondary | ICD-10-CM | POA: Diagnosis not present

## 2018-11-03 DIAGNOSIS — D631 Anemia in chronic kidney disease: Secondary | ICD-10-CM | POA: Diagnosis not present

## 2018-11-03 DIAGNOSIS — N186 End stage renal disease: Secondary | ICD-10-CM | POA: Diagnosis not present

## 2018-11-03 DIAGNOSIS — N2581 Secondary hyperparathyroidism of renal origin: Secondary | ICD-10-CM | POA: Diagnosis not present

## 2018-11-04 DIAGNOSIS — N186 End stage renal disease: Secondary | ICD-10-CM | POA: Diagnosis not present

## 2018-11-04 DIAGNOSIS — N2581 Secondary hyperparathyroidism of renal origin: Secondary | ICD-10-CM | POA: Diagnosis not present

## 2018-11-04 DIAGNOSIS — Z79899 Other long term (current) drug therapy: Secondary | ICD-10-CM | POA: Diagnosis not present

## 2018-11-04 DIAGNOSIS — D631 Anemia in chronic kidney disease: Secondary | ICD-10-CM | POA: Diagnosis not present

## 2018-11-05 DIAGNOSIS — Z79899 Other long term (current) drug therapy: Secondary | ICD-10-CM | POA: Diagnosis not present

## 2018-11-05 DIAGNOSIS — D631 Anemia in chronic kidney disease: Secondary | ICD-10-CM | POA: Diagnosis not present

## 2018-11-05 DIAGNOSIS — N2581 Secondary hyperparathyroidism of renal origin: Secondary | ICD-10-CM | POA: Diagnosis not present

## 2018-11-05 DIAGNOSIS — N186 End stage renal disease: Secondary | ICD-10-CM | POA: Diagnosis not present

## 2018-11-07 DIAGNOSIS — N186 End stage renal disease: Secondary | ICD-10-CM | POA: Diagnosis not present

## 2018-11-07 DIAGNOSIS — N2581 Secondary hyperparathyroidism of renal origin: Secondary | ICD-10-CM | POA: Diagnosis not present

## 2018-11-07 DIAGNOSIS — D631 Anemia in chronic kidney disease: Secondary | ICD-10-CM | POA: Diagnosis not present

## 2018-11-07 DIAGNOSIS — Z79899 Other long term (current) drug therapy: Secondary | ICD-10-CM | POA: Diagnosis not present

## 2018-11-09 DIAGNOSIS — N186 End stage renal disease: Secondary | ICD-10-CM | POA: Diagnosis not present

## 2018-11-09 DIAGNOSIS — Z79899 Other long term (current) drug therapy: Secondary | ICD-10-CM | POA: Diagnosis not present

## 2018-11-09 DIAGNOSIS — D509 Iron deficiency anemia, unspecified: Secondary | ICD-10-CM | POA: Diagnosis not present

## 2018-11-09 DIAGNOSIS — D631 Anemia in chronic kidney disease: Secondary | ICD-10-CM | POA: Diagnosis not present

## 2018-11-09 DIAGNOSIS — N2581 Secondary hyperparathyroidism of renal origin: Secondary | ICD-10-CM | POA: Diagnosis not present

## 2018-11-09 DIAGNOSIS — E875 Hyperkalemia: Secondary | ICD-10-CM | POA: Diagnosis not present

## 2018-11-11 DIAGNOSIS — E875 Hyperkalemia: Secondary | ICD-10-CM | POA: Diagnosis not present

## 2018-11-11 DIAGNOSIS — D509 Iron deficiency anemia, unspecified: Secondary | ICD-10-CM | POA: Diagnosis not present

## 2018-11-11 DIAGNOSIS — N186 End stage renal disease: Secondary | ICD-10-CM | POA: Diagnosis not present

## 2018-11-11 DIAGNOSIS — D631 Anemia in chronic kidney disease: Secondary | ICD-10-CM | POA: Diagnosis not present

## 2018-11-11 DIAGNOSIS — N2581 Secondary hyperparathyroidism of renal origin: Secondary | ICD-10-CM | POA: Diagnosis not present

## 2018-11-11 DIAGNOSIS — Z79899 Other long term (current) drug therapy: Secondary | ICD-10-CM | POA: Diagnosis not present

## 2018-11-12 DIAGNOSIS — E875 Hyperkalemia: Secondary | ICD-10-CM | POA: Diagnosis not present

## 2018-11-12 DIAGNOSIS — M545 Low back pain: Secondary | ICD-10-CM | POA: Diagnosis not present

## 2018-11-12 DIAGNOSIS — J449 Chronic obstructive pulmonary disease, unspecified: Secondary | ICD-10-CM | POA: Diagnosis not present

## 2018-11-12 DIAGNOSIS — D631 Anemia in chronic kidney disease: Secondary | ICD-10-CM | POA: Diagnosis not present

## 2018-11-12 DIAGNOSIS — N186 End stage renal disease: Secondary | ICD-10-CM | POA: Diagnosis not present

## 2018-11-12 DIAGNOSIS — I82409 Acute embolism and thrombosis of unspecified deep veins of unspecified lower extremity: Secondary | ICD-10-CM | POA: Diagnosis not present

## 2018-11-12 DIAGNOSIS — F0390 Unspecified dementia without behavioral disturbance: Secondary | ICD-10-CM | POA: Diagnosis not present

## 2018-11-12 DIAGNOSIS — Z79899 Other long term (current) drug therapy: Secondary | ICD-10-CM | POA: Diagnosis not present

## 2018-11-12 DIAGNOSIS — N2581 Secondary hyperparathyroidism of renal origin: Secondary | ICD-10-CM | POA: Diagnosis not present

## 2018-11-12 DIAGNOSIS — D509 Iron deficiency anemia, unspecified: Secondary | ICD-10-CM | POA: Diagnosis not present

## 2018-11-13 ENCOUNTER — Ambulatory Visit: Payer: Medicare Other | Admitting: Sports Medicine

## 2018-11-13 DIAGNOSIS — I82409 Acute embolism and thrombosis of unspecified deep veins of unspecified lower extremity: Secondary | ICD-10-CM | POA: Diagnosis not present

## 2018-11-13 DIAGNOSIS — F0390 Unspecified dementia without behavioral disturbance: Secondary | ICD-10-CM | POA: Diagnosis not present

## 2018-11-13 DIAGNOSIS — R197 Diarrhea, unspecified: Secondary | ICD-10-CM | POA: Diagnosis not present

## 2018-11-13 DIAGNOSIS — J449 Chronic obstructive pulmonary disease, unspecified: Secondary | ICD-10-CM | POA: Diagnosis not present

## 2018-11-14 DIAGNOSIS — E875 Hyperkalemia: Secondary | ICD-10-CM | POA: Diagnosis not present

## 2018-11-14 DIAGNOSIS — N186 End stage renal disease: Secondary | ICD-10-CM | POA: Diagnosis not present

## 2018-11-14 DIAGNOSIS — D509 Iron deficiency anemia, unspecified: Secondary | ICD-10-CM | POA: Diagnosis not present

## 2018-11-14 DIAGNOSIS — Z79899 Other long term (current) drug therapy: Secondary | ICD-10-CM | POA: Diagnosis not present

## 2018-11-14 DIAGNOSIS — D631 Anemia in chronic kidney disease: Secondary | ICD-10-CM | POA: Diagnosis not present

## 2018-11-14 DIAGNOSIS — N2581 Secondary hyperparathyroidism of renal origin: Secondary | ICD-10-CM | POA: Diagnosis not present

## 2018-11-16 DIAGNOSIS — N2581 Secondary hyperparathyroidism of renal origin: Secondary | ICD-10-CM | POA: Diagnosis not present

## 2018-11-16 DIAGNOSIS — D509 Iron deficiency anemia, unspecified: Secondary | ICD-10-CM | POA: Diagnosis not present

## 2018-11-16 DIAGNOSIS — D631 Anemia in chronic kidney disease: Secondary | ICD-10-CM | POA: Diagnosis not present

## 2018-11-16 DIAGNOSIS — Z79899 Other long term (current) drug therapy: Secondary | ICD-10-CM | POA: Diagnosis not present

## 2018-11-16 DIAGNOSIS — N186 End stage renal disease: Secondary | ICD-10-CM | POA: Diagnosis not present

## 2018-11-16 DIAGNOSIS — E875 Hyperkalemia: Secondary | ICD-10-CM | POA: Diagnosis not present

## 2018-11-17 DIAGNOSIS — N2581 Secondary hyperparathyroidism of renal origin: Secondary | ICD-10-CM | POA: Diagnosis not present

## 2018-11-17 DIAGNOSIS — N186 End stage renal disease: Secondary | ICD-10-CM | POA: Diagnosis not present

## 2018-11-17 DIAGNOSIS — Z79899 Other long term (current) drug therapy: Secondary | ICD-10-CM | POA: Diagnosis not present

## 2018-11-17 DIAGNOSIS — D509 Iron deficiency anemia, unspecified: Secondary | ICD-10-CM | POA: Diagnosis not present

## 2018-11-17 DIAGNOSIS — E875 Hyperkalemia: Secondary | ICD-10-CM | POA: Diagnosis not present

## 2018-11-17 DIAGNOSIS — D631 Anemia in chronic kidney disease: Secondary | ICD-10-CM | POA: Diagnosis not present

## 2018-11-21 DIAGNOSIS — E875 Hyperkalemia: Secondary | ICD-10-CM | POA: Diagnosis not present

## 2018-11-21 DIAGNOSIS — D509 Iron deficiency anemia, unspecified: Secondary | ICD-10-CM | POA: Diagnosis not present

## 2018-11-21 DIAGNOSIS — Z79899 Other long term (current) drug therapy: Secondary | ICD-10-CM | POA: Diagnosis not present

## 2018-11-21 DIAGNOSIS — N2581 Secondary hyperparathyroidism of renal origin: Secondary | ICD-10-CM | POA: Diagnosis not present

## 2018-11-21 DIAGNOSIS — D631 Anemia in chronic kidney disease: Secondary | ICD-10-CM | POA: Diagnosis not present

## 2018-11-21 DIAGNOSIS — N186 End stage renal disease: Secondary | ICD-10-CM | POA: Diagnosis not present

## 2018-11-23 DIAGNOSIS — N2581 Secondary hyperparathyroidism of renal origin: Secondary | ICD-10-CM | POA: Diagnosis not present

## 2018-11-23 DIAGNOSIS — Z79899 Other long term (current) drug therapy: Secondary | ICD-10-CM | POA: Diagnosis not present

## 2018-11-23 DIAGNOSIS — N186 End stage renal disease: Secondary | ICD-10-CM | POA: Diagnosis not present

## 2018-11-23 DIAGNOSIS — D631 Anemia in chronic kidney disease: Secondary | ICD-10-CM | POA: Diagnosis not present

## 2018-11-23 DIAGNOSIS — D509 Iron deficiency anemia, unspecified: Secondary | ICD-10-CM | POA: Diagnosis not present

## 2018-11-23 DIAGNOSIS — E875 Hyperkalemia: Secondary | ICD-10-CM | POA: Diagnosis not present

## 2018-11-25 DIAGNOSIS — E875 Hyperkalemia: Secondary | ICD-10-CM | POA: Diagnosis not present

## 2018-11-25 DIAGNOSIS — D509 Iron deficiency anemia, unspecified: Secondary | ICD-10-CM | POA: Diagnosis not present

## 2018-11-25 DIAGNOSIS — N2581 Secondary hyperparathyroidism of renal origin: Secondary | ICD-10-CM | POA: Diagnosis not present

## 2018-11-25 DIAGNOSIS — D631 Anemia in chronic kidney disease: Secondary | ICD-10-CM | POA: Diagnosis not present

## 2018-11-25 DIAGNOSIS — Z79899 Other long term (current) drug therapy: Secondary | ICD-10-CM | POA: Diagnosis not present

## 2018-11-25 DIAGNOSIS — N186 End stage renal disease: Secondary | ICD-10-CM | POA: Diagnosis not present

## 2018-11-26 DIAGNOSIS — D509 Iron deficiency anemia, unspecified: Secondary | ICD-10-CM | POA: Diagnosis not present

## 2018-11-26 DIAGNOSIS — N186 End stage renal disease: Secondary | ICD-10-CM | POA: Diagnosis not present

## 2018-11-26 DIAGNOSIS — Z79899 Other long term (current) drug therapy: Secondary | ICD-10-CM | POA: Diagnosis not present

## 2018-11-26 DIAGNOSIS — D631 Anemia in chronic kidney disease: Secondary | ICD-10-CM | POA: Diagnosis not present

## 2018-11-26 DIAGNOSIS — E875 Hyperkalemia: Secondary | ICD-10-CM | POA: Diagnosis not present

## 2018-11-26 DIAGNOSIS — N2581 Secondary hyperparathyroidism of renal origin: Secondary | ICD-10-CM | POA: Diagnosis not present

## 2018-11-28 DIAGNOSIS — N186 End stage renal disease: Secondary | ICD-10-CM | POA: Diagnosis not present

## 2018-11-28 DIAGNOSIS — N2581 Secondary hyperparathyroidism of renal origin: Secondary | ICD-10-CM | POA: Diagnosis not present

## 2018-11-28 DIAGNOSIS — D509 Iron deficiency anemia, unspecified: Secondary | ICD-10-CM | POA: Diagnosis not present

## 2018-11-28 DIAGNOSIS — Z79899 Other long term (current) drug therapy: Secondary | ICD-10-CM | POA: Diagnosis not present

## 2018-11-28 DIAGNOSIS — D631 Anemia in chronic kidney disease: Secondary | ICD-10-CM | POA: Diagnosis not present

## 2018-11-28 DIAGNOSIS — E875 Hyperkalemia: Secondary | ICD-10-CM | POA: Diagnosis not present

## 2018-11-30 DIAGNOSIS — D509 Iron deficiency anemia, unspecified: Secondary | ICD-10-CM | POA: Diagnosis not present

## 2018-11-30 DIAGNOSIS — Z79899 Other long term (current) drug therapy: Secondary | ICD-10-CM | POA: Diagnosis not present

## 2018-11-30 DIAGNOSIS — N2581 Secondary hyperparathyroidism of renal origin: Secondary | ICD-10-CM | POA: Diagnosis not present

## 2018-11-30 DIAGNOSIS — D631 Anemia in chronic kidney disease: Secondary | ICD-10-CM | POA: Diagnosis not present

## 2018-11-30 DIAGNOSIS — N186 End stage renal disease: Secondary | ICD-10-CM | POA: Diagnosis not present

## 2018-11-30 DIAGNOSIS — E875 Hyperkalemia: Secondary | ICD-10-CM | POA: Diagnosis not present

## 2018-12-02 DIAGNOSIS — E875 Hyperkalemia: Secondary | ICD-10-CM | POA: Diagnosis not present

## 2018-12-02 DIAGNOSIS — D631 Anemia in chronic kidney disease: Secondary | ICD-10-CM | POA: Diagnosis not present

## 2018-12-02 DIAGNOSIS — Z79899 Other long term (current) drug therapy: Secondary | ICD-10-CM | POA: Diagnosis not present

## 2018-12-02 DIAGNOSIS — D509 Iron deficiency anemia, unspecified: Secondary | ICD-10-CM | POA: Diagnosis not present

## 2018-12-02 DIAGNOSIS — N186 End stage renal disease: Secondary | ICD-10-CM | POA: Diagnosis not present

## 2018-12-02 DIAGNOSIS — N2581 Secondary hyperparathyroidism of renal origin: Secondary | ICD-10-CM | POA: Diagnosis not present

## 2018-12-03 DIAGNOSIS — N186 End stage renal disease: Secondary | ICD-10-CM | POA: Diagnosis not present

## 2018-12-03 DIAGNOSIS — D509 Iron deficiency anemia, unspecified: Secondary | ICD-10-CM | POA: Diagnosis not present

## 2018-12-03 DIAGNOSIS — Z79899 Other long term (current) drug therapy: Secondary | ICD-10-CM | POA: Diagnosis not present

## 2018-12-03 DIAGNOSIS — D631 Anemia in chronic kidney disease: Secondary | ICD-10-CM | POA: Diagnosis not present

## 2018-12-03 DIAGNOSIS — E875 Hyperkalemia: Secondary | ICD-10-CM | POA: Diagnosis not present

## 2018-12-03 DIAGNOSIS — N2581 Secondary hyperparathyroidism of renal origin: Secondary | ICD-10-CM | POA: Diagnosis not present

## 2018-12-05 DIAGNOSIS — D509 Iron deficiency anemia, unspecified: Secondary | ICD-10-CM | POA: Diagnosis not present

## 2018-12-05 DIAGNOSIS — Z79899 Other long term (current) drug therapy: Secondary | ICD-10-CM | POA: Diagnosis not present

## 2018-12-05 DIAGNOSIS — N186 End stage renal disease: Secondary | ICD-10-CM | POA: Diagnosis not present

## 2018-12-05 DIAGNOSIS — E875 Hyperkalemia: Secondary | ICD-10-CM | POA: Diagnosis not present

## 2018-12-05 DIAGNOSIS — N2581 Secondary hyperparathyroidism of renal origin: Secondary | ICD-10-CM | POA: Diagnosis not present

## 2018-12-05 DIAGNOSIS — D631 Anemia in chronic kidney disease: Secondary | ICD-10-CM | POA: Diagnosis not present

## 2018-12-07 DIAGNOSIS — E875 Hyperkalemia: Secondary | ICD-10-CM | POA: Diagnosis not present

## 2018-12-07 DIAGNOSIS — D631 Anemia in chronic kidney disease: Secondary | ICD-10-CM | POA: Diagnosis not present

## 2018-12-07 DIAGNOSIS — D509 Iron deficiency anemia, unspecified: Secondary | ICD-10-CM | POA: Diagnosis not present

## 2018-12-07 DIAGNOSIS — Z79899 Other long term (current) drug therapy: Secondary | ICD-10-CM | POA: Diagnosis not present

## 2018-12-07 DIAGNOSIS — N186 End stage renal disease: Secondary | ICD-10-CM | POA: Diagnosis not present

## 2018-12-07 DIAGNOSIS — N2581 Secondary hyperparathyroidism of renal origin: Secondary | ICD-10-CM | POA: Diagnosis not present

## 2018-12-09 DIAGNOSIS — Z992 Dependence on renal dialysis: Secondary | ICD-10-CM | POA: Diagnosis not present

## 2018-12-09 DIAGNOSIS — D631 Anemia in chronic kidney disease: Secondary | ICD-10-CM | POA: Diagnosis not present

## 2018-12-09 DIAGNOSIS — N186 End stage renal disease: Secondary | ICD-10-CM | POA: Diagnosis not present

## 2018-12-09 DIAGNOSIS — Z79899 Other long term (current) drug therapy: Secondary | ICD-10-CM | POA: Diagnosis not present

## 2018-12-09 DIAGNOSIS — F0390 Unspecified dementia without behavioral disturbance: Secondary | ICD-10-CM | POA: Diagnosis not present

## 2018-12-09 DIAGNOSIS — N2581 Secondary hyperparathyroidism of renal origin: Secondary | ICD-10-CM | POA: Diagnosis not present

## 2018-12-09 DIAGNOSIS — I82409 Acute embolism and thrombosis of unspecified deep veins of unspecified lower extremity: Secondary | ICD-10-CM | POA: Diagnosis not present

## 2018-12-09 DIAGNOSIS — D509 Iron deficiency anemia, unspecified: Secondary | ICD-10-CM | POA: Diagnosis not present

## 2018-12-09 DIAGNOSIS — N059 Unspecified nephritic syndrome with unspecified morphologic changes: Secondary | ICD-10-CM | POA: Diagnosis not present

## 2018-12-09 DIAGNOSIS — M545 Low back pain: Secondary | ICD-10-CM | POA: Diagnosis not present

## 2018-12-09 DIAGNOSIS — J449 Chronic obstructive pulmonary disease, unspecified: Secondary | ICD-10-CM | POA: Diagnosis not present

## 2018-12-10 DIAGNOSIS — M545 Low back pain: Secondary | ICD-10-CM | POA: Diagnosis not present

## 2018-12-10 DIAGNOSIS — F0390 Unspecified dementia without behavioral disturbance: Secondary | ICD-10-CM | POA: Diagnosis not present

## 2018-12-10 DIAGNOSIS — D509 Iron deficiency anemia, unspecified: Secondary | ICD-10-CM | POA: Diagnosis not present

## 2018-12-10 DIAGNOSIS — I82409 Acute embolism and thrombosis of unspecified deep veins of unspecified lower extremity: Secondary | ICD-10-CM | POA: Diagnosis not present

## 2018-12-10 DIAGNOSIS — Z79899 Other long term (current) drug therapy: Secondary | ICD-10-CM | POA: Diagnosis not present

## 2018-12-10 DIAGNOSIS — D631 Anemia in chronic kidney disease: Secondary | ICD-10-CM | POA: Diagnosis not present

## 2018-12-10 DIAGNOSIS — N186 End stage renal disease: Secondary | ICD-10-CM | POA: Diagnosis not present

## 2018-12-10 DIAGNOSIS — J449 Chronic obstructive pulmonary disease, unspecified: Secondary | ICD-10-CM | POA: Diagnosis not present

## 2018-12-10 DIAGNOSIS — N2581 Secondary hyperparathyroidism of renal origin: Secondary | ICD-10-CM | POA: Diagnosis not present

## 2018-12-12 DIAGNOSIS — N2581 Secondary hyperparathyroidism of renal origin: Secondary | ICD-10-CM | POA: Diagnosis not present

## 2018-12-12 DIAGNOSIS — Z79899 Other long term (current) drug therapy: Secondary | ICD-10-CM | POA: Diagnosis not present

## 2018-12-12 DIAGNOSIS — D631 Anemia in chronic kidney disease: Secondary | ICD-10-CM | POA: Diagnosis not present

## 2018-12-12 DIAGNOSIS — D509 Iron deficiency anemia, unspecified: Secondary | ICD-10-CM | POA: Diagnosis not present

## 2018-12-12 DIAGNOSIS — N186 End stage renal disease: Secondary | ICD-10-CM | POA: Diagnosis not present

## 2018-12-14 DIAGNOSIS — Z79899 Other long term (current) drug therapy: Secondary | ICD-10-CM | POA: Diagnosis not present

## 2018-12-14 DIAGNOSIS — D631 Anemia in chronic kidney disease: Secondary | ICD-10-CM | POA: Diagnosis not present

## 2018-12-14 DIAGNOSIS — D509 Iron deficiency anemia, unspecified: Secondary | ICD-10-CM | POA: Diagnosis not present

## 2018-12-14 DIAGNOSIS — N186 End stage renal disease: Secondary | ICD-10-CM | POA: Diagnosis not present

## 2018-12-14 DIAGNOSIS — N2581 Secondary hyperparathyroidism of renal origin: Secondary | ICD-10-CM | POA: Diagnosis not present

## 2018-12-16 DIAGNOSIS — D509 Iron deficiency anemia, unspecified: Secondary | ICD-10-CM | POA: Diagnosis not present

## 2018-12-16 DIAGNOSIS — Z79899 Other long term (current) drug therapy: Secondary | ICD-10-CM | POA: Diagnosis not present

## 2018-12-16 DIAGNOSIS — N2581 Secondary hyperparathyroidism of renal origin: Secondary | ICD-10-CM | POA: Diagnosis not present

## 2018-12-16 DIAGNOSIS — N186 End stage renal disease: Secondary | ICD-10-CM | POA: Diagnosis not present

## 2018-12-16 DIAGNOSIS — D631 Anemia in chronic kidney disease: Secondary | ICD-10-CM | POA: Diagnosis not present

## 2018-12-17 DIAGNOSIS — J449 Chronic obstructive pulmonary disease, unspecified: Secondary | ICD-10-CM | POA: Diagnosis not present

## 2018-12-17 DIAGNOSIS — N2581 Secondary hyperparathyroidism of renal origin: Secondary | ICD-10-CM | POA: Diagnosis not present

## 2018-12-17 DIAGNOSIS — N186 End stage renal disease: Secondary | ICD-10-CM | POA: Diagnosis not present

## 2018-12-17 DIAGNOSIS — D509 Iron deficiency anemia, unspecified: Secondary | ICD-10-CM | POA: Diagnosis not present

## 2018-12-17 DIAGNOSIS — F0390 Unspecified dementia without behavioral disturbance: Secondary | ICD-10-CM | POA: Diagnosis not present

## 2018-12-17 DIAGNOSIS — Z79899 Other long term (current) drug therapy: Secondary | ICD-10-CM | POA: Diagnosis not present

## 2018-12-17 DIAGNOSIS — I82409 Acute embolism and thrombosis of unspecified deep veins of unspecified lower extremity: Secondary | ICD-10-CM | POA: Diagnosis not present

## 2018-12-17 DIAGNOSIS — L0390 Cellulitis, unspecified: Secondary | ICD-10-CM | POA: Diagnosis not present

## 2018-12-17 DIAGNOSIS — D631 Anemia in chronic kidney disease: Secondary | ICD-10-CM | POA: Diagnosis not present

## 2018-12-19 DIAGNOSIS — D509 Iron deficiency anemia, unspecified: Secondary | ICD-10-CM | POA: Diagnosis not present

## 2018-12-19 DIAGNOSIS — N2581 Secondary hyperparathyroidism of renal origin: Secondary | ICD-10-CM | POA: Diagnosis not present

## 2018-12-19 DIAGNOSIS — N186 End stage renal disease: Secondary | ICD-10-CM | POA: Diagnosis not present

## 2018-12-19 DIAGNOSIS — Z79899 Other long term (current) drug therapy: Secondary | ICD-10-CM | POA: Diagnosis not present

## 2018-12-19 DIAGNOSIS — D631 Anemia in chronic kidney disease: Secondary | ICD-10-CM | POA: Diagnosis not present

## 2018-12-21 DIAGNOSIS — N186 End stage renal disease: Secondary | ICD-10-CM | POA: Diagnosis not present

## 2018-12-21 DIAGNOSIS — N2581 Secondary hyperparathyroidism of renal origin: Secondary | ICD-10-CM | POA: Diagnosis not present

## 2018-12-21 DIAGNOSIS — Z79899 Other long term (current) drug therapy: Secondary | ICD-10-CM | POA: Diagnosis not present

## 2018-12-21 DIAGNOSIS — D509 Iron deficiency anemia, unspecified: Secondary | ICD-10-CM | POA: Diagnosis not present

## 2018-12-21 DIAGNOSIS — D631 Anemia in chronic kidney disease: Secondary | ICD-10-CM | POA: Diagnosis not present

## 2018-12-23 DIAGNOSIS — D509 Iron deficiency anemia, unspecified: Secondary | ICD-10-CM | POA: Diagnosis not present

## 2018-12-23 DIAGNOSIS — N186 End stage renal disease: Secondary | ICD-10-CM | POA: Diagnosis not present

## 2018-12-23 DIAGNOSIS — N2581 Secondary hyperparathyroidism of renal origin: Secondary | ICD-10-CM | POA: Diagnosis not present

## 2018-12-23 DIAGNOSIS — Z79899 Other long term (current) drug therapy: Secondary | ICD-10-CM | POA: Diagnosis not present

## 2018-12-23 DIAGNOSIS — D631 Anemia in chronic kidney disease: Secondary | ICD-10-CM | POA: Diagnosis not present

## 2018-12-24 DIAGNOSIS — Z79899 Other long term (current) drug therapy: Secondary | ICD-10-CM | POA: Diagnosis not present

## 2018-12-24 DIAGNOSIS — D631 Anemia in chronic kidney disease: Secondary | ICD-10-CM | POA: Diagnosis not present

## 2018-12-24 DIAGNOSIS — D509 Iron deficiency anemia, unspecified: Secondary | ICD-10-CM | POA: Diagnosis not present

## 2018-12-24 DIAGNOSIS — N2581 Secondary hyperparathyroidism of renal origin: Secondary | ICD-10-CM | POA: Diagnosis not present

## 2018-12-24 DIAGNOSIS — N186 End stage renal disease: Secondary | ICD-10-CM | POA: Diagnosis not present

## 2018-12-25 DIAGNOSIS — F0390 Unspecified dementia without behavioral disturbance: Secondary | ICD-10-CM | POA: Diagnosis not present

## 2018-12-25 DIAGNOSIS — J449 Chronic obstructive pulmonary disease, unspecified: Secondary | ICD-10-CM | POA: Diagnosis not present

## 2018-12-25 DIAGNOSIS — I82409 Acute embolism and thrombosis of unspecified deep veins of unspecified lower extremity: Secondary | ICD-10-CM | POA: Diagnosis not present

## 2018-12-25 DIAGNOSIS — M545 Low back pain: Secondary | ICD-10-CM | POA: Diagnosis not present

## 2018-12-26 DIAGNOSIS — N2581 Secondary hyperparathyroidism of renal origin: Secondary | ICD-10-CM | POA: Diagnosis not present

## 2018-12-26 DIAGNOSIS — N186 End stage renal disease: Secondary | ICD-10-CM | POA: Diagnosis not present

## 2018-12-26 DIAGNOSIS — D509 Iron deficiency anemia, unspecified: Secondary | ICD-10-CM | POA: Diagnosis not present

## 2018-12-26 DIAGNOSIS — J449 Chronic obstructive pulmonary disease, unspecified: Secondary | ICD-10-CM | POA: Diagnosis not present

## 2018-12-26 DIAGNOSIS — K5900 Constipation, unspecified: Secondary | ICD-10-CM | POA: Diagnosis not present

## 2018-12-26 DIAGNOSIS — Z79899 Other long term (current) drug therapy: Secondary | ICD-10-CM | POA: Diagnosis not present

## 2018-12-26 DIAGNOSIS — D631 Anemia in chronic kidney disease: Secondary | ICD-10-CM | POA: Diagnosis not present

## 2018-12-26 DIAGNOSIS — M545 Low back pain: Secondary | ICD-10-CM | POA: Diagnosis not present

## 2018-12-26 DIAGNOSIS — I82409 Acute embolism and thrombosis of unspecified deep veins of unspecified lower extremity: Secondary | ICD-10-CM | POA: Diagnosis not present

## 2019-01-08 DEATH — deceased
